# Patient Record
Sex: Male | Born: 1995 | Race: Black or African American | Hispanic: No | Marital: Single | Smoking: Current every day smoker
Health system: Southern US, Community
[De-identification: ages and names within clinical notes are randomized; demographics above are authoritative.]

## PROBLEM LIST (undated history)

## (undated) DIAGNOSIS — Z113 Encounter for screening for infections with a predominantly sexual mode of transmission: Secondary | ICD-10-CM

## (undated) DIAGNOSIS — Z599 Problem related to housing and economic circumstances, unspecified: Secondary | ICD-10-CM

## (undated) DIAGNOSIS — B20 Human immunodeficiency virus [HIV] disease: Secondary | ICD-10-CM

## (undated) DIAGNOSIS — R1013 Epigastric pain: Secondary | ICD-10-CM

## (undated) DIAGNOSIS — A539 Syphilis, unspecified: Secondary | ICD-10-CM

## (undated) DIAGNOSIS — Z21 Asymptomatic human immunodeficiency virus [HIV] infection status: Secondary | ICD-10-CM

## (undated) DIAGNOSIS — Z8659 Personal history of other mental and behavioral disorders: Secondary | ICD-10-CM

## (undated) HISTORY — DX: Problem related to housing and economic circumstances, unspecified: Z59.9

## (undated) HISTORY — DX: Human immunodeficiency virus (HIV) disease: B20

## (undated) HISTORY — DX: Encounter for screening for infections with a predominantly sexual mode of transmission: Z11.3

## (undated) HISTORY — DX: Syphilis, unspecified: A53.9

## (undated) HISTORY — DX: Asymptomatic human immunodeficiency virus (hiv) infection status: Z21

## (undated) HISTORY — DX: Personal history of other mental and behavioral disorders: Z86.59

## (undated) HISTORY — DX: Epigastric pain: R10.13

---
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
## 2013-07-30 ENCOUNTER — Encounter (HOSPITAL_COMMUNITY): Payer: Self-pay | Admitting: Family Medicine

## 2013-07-30 ENCOUNTER — Emergency Department (HOSPITAL_COMMUNITY): Payer: Medicaid Other

## 2013-07-30 ENCOUNTER — Emergency Department (HOSPITAL_COMMUNITY)
Admission: EM | Admit: 2013-07-30 | Discharge: 2013-07-30 | Disposition: A | Discharge status: Home / Self Care | Payer: Medicaid Other | Attending: Emergency Medicine | Admitting: Emergency Medicine

## 2013-07-30 DIAGNOSIS — F17200 Nicotine dependence, unspecified, uncomplicated: Secondary | ICD-10-CM | POA: Insufficient documentation

## 2013-07-30 DIAGNOSIS — M25569 Pain in unspecified knee: Secondary | ICD-10-CM | POA: Insufficient documentation

## 2013-07-30 DIAGNOSIS — M25562 Pain in left knee: Secondary | ICD-10-CM

## 2013-07-30 MED ORDER — ACETAMINOPHEN-CODEINE #3 300-30 MG PO TABS: 1.0000 | ORAL_TABLET | Freq: Four times a day (QID) | ORAL | Status: DC | PRN

## 2013-07-30 MED ORDER — IBUPROFEN 600 MG PO TABS: 600.0000 mg | ORAL_TABLET | Freq: Three times a day (TID) | ORAL | Status: DC

## 2013-07-30 MED ORDER — HYDROCODONE-ACETAMINOPHEN 5-325 MG PO TABS
2.0000 | ORAL_TABLET | Freq: Once | ORAL | Status: AC
  Administered 2013-07-30: 2 via ORAL
  Filled 2013-07-30: qty 2

## 2013-07-30 NOTE — ED Provider Notes (Signed)
CSN: [id number]     Arrival date & time 07/30/13  0441 History     First MD Initiated Contact with Patient 07/30/13 0458     Chief Complaint  Patient presents with  . Knee Pain   (Consider location/radiation/quality/duration/timing/severity/associated sxs/prior Treatment) Patient is a 17 y.o. male presenting with knee pain. The history is provided by the patient and the EMS personnel.  Knee Pain Location:  Knee Time since incident:  2 hours Injury: no   Knee location:  L knee Pain details:    Quality:  Shooting and sharp   Radiates to:  Does not radiate   Severity:  Severe   Onset quality:  Sudden   Timing:  Constant   Progression:  Unchanged Chronicity:  New Dislocation: no   Prior injury to area:  No Relieved by:  Immobilization Worsened by:  Bearing weight, activity, extension, abduction, adduction, flexion and rotation Ineffective treatments:  None tried Associated symptoms: decreased ROM and swelling   Associated symptoms: no back pain, no fever, no muscle weakness, no numbness and no stiffness   Risk factors: no concern for non-accidental trauma, no frequent fractures, no known bone disorder, no obesity and no recent illness     History reviewed. No pertinent past medical history. Past Surgical History  Procedure Laterality Date  . Wisdom tooth extraction  2014   History reviewed. No pertinent family history. History  Substance Use Topics  . Smoking status: Current Every Day Smoker -- 0.50 packs/day  . Smokeless tobacco: Not on file  . Alcohol Use: No    Review of Systems  Constitutional: Negative for fever and chills.  Musculoskeletal: Positive for arthralgias. Negative for back pain and stiffness.  Skin: Negative for color change and wound.  Neurological: Negative for weakness and numbness.    Allergies  Depakote  Home Medications   Current Outpatient Rx  Name  Route  Sig  Dispense  Refill  . acetaminophen-codeine (TYLENOL #3) 300-30 MG per  tablet   Oral   Take 1-2 tablets by mouth every 6 (six) hours as needed for pain.   15 tablet   0   . ibuprofen (ADVIL,MOTRIN) 600 MG tablet   Oral   Take 1 tablet (600 mg total) by mouth every 8 (eight) hours. Take with food   28 tablet   0    BP 121/61  Pulse 69  Temp(Src) 98.2 F (36.8 C) (Oral)  Resp 16  Ht 5\' 9"  (1.753 m)  Wt 150 lb (68.04 kg)  BMI 22.14 kg/m2  SpO2 99% Physical Exam  Nursing note and vitals reviewed. Constitutional: He appears well-developed and well-nourished. No distress.  Pulmonary/Chest: Effort normal. No respiratory distress.  Musculoskeletal: He exhibits tenderness. He exhibits no edema.       Left knee: He exhibits decreased range of motion. He exhibits no effusion, no ecchymosis, no deformity, no laceration, no erythema and normal alignment. Tenderness found.  Pt is keeping left knee in comfortable position in fully flexed state.  Pt reports extreme pain with any type of movement, pain with palpation diffusely.  2+ distal pulses, 2+ popliteal pulse, no abrasions, lacerations.  Intact distal sensation.  Soft compartments  Neurological: He is alert. He exhibits normal muscle tone.  Skin: Skin is warm and dry. No rash noted. He is not diaphoretic. No erythema.  Psychiatric: He has a normal mood and affect.    ED Course   Procedures (including critical care time)  Labs Reviewed - No data to display Dg  Knee Complete 4 Views Left  07/30/2013   *RADIOLOGY REPORT*  Clinical Data: Left knee pain.  No known injury.  LEFT KNEE - COMPLETE 4+ VIEW  Comparison: None.  Findings: The left knee appears intact. No evidence of acute fracture or subluxation.  No focal bone lesions.  Bone matrix and cortex appear intact.  No abnormal radiopaque densities in the soft tissues.  No significant effusion.  IMPRESSION: No acute bony abnormalities demonstrated in the left knee.   Original Report Authenticated By: Burman Nieves, M.D.   1. Knee pain, acute, left     RA  sat is 100% and I interpret to be normal  6:13 AM I reviewed plain films myself and radiologist interpretation.  No bony abn, no effusion.  Will place in knee sleeve, recommend RICE, refer to sports clinic or ortho if not improving in the next few days.    7:23 AM Pt's knee is improved.  Plain films neg.  Pt reports when he straightened knee out, ti seemed to improve the pain suddenly.  This supports that may have been a meniscal injury.    MDM  Pt with non traumatic sudden knee pain, very limited ROM due to pain, kept in flexed position.  Pt will not relax to extend it to anatomic position.  Will give analgesics and obtain plain films and reassess.  Neurovascularly intact  Gavin Pound. Oletta Lamas, MD 07/30/13 [phone]

## 2013-07-30 NOTE — ED Notes (Signed)
Pt transported to Xray by this RN, Xrays performed. Pt states felt "pop" upon straightening knee, states is in extreme pain. Pt alert, calm, texting on cell phone during xray.

## 2013-07-30 NOTE — ED Notes (Signed)
Pt talking on phone

## 2013-07-30 NOTE — Discharge Instructions (Signed)
Knee Pain  The knee is the complex joint between your thigh and your lower leg. It is made up of bones, tendons, ligaments, and cartilage. The bones that make up the knee are:   The femur in the thigh.   The tibia and fibula in the lower leg.   The patella or kneecap riding in the groove on the lower femur.  CAUSES   Knee pain is a common complaint with many causes. A few of these causes are:   Injury, such as:   A ruptured ligament or tendon injury.   Torn cartilage.   Medical conditions, such as:   Gout   Arthritis   Infections   Overuse, over training or overdoing a physical activity.  Knee pain can be minor or severe. Knee pain can accompany debilitating injury. Minor knee problems often respond well to self-care measures or get well on their own. More serious injuries may need medical intervention or even surgery.  SYMPTOMS  The knee is complex. Symptoms of knee problems can vary widely. Some of the problems are:   Pain with movement and weight bearing.   Swelling and tenderness.   Buckling of the knee.   Inability to straighten or extend your knee.   Your knee locks and you cannot straighten it.   Warmth and redness with pain and fever.   Deformity or dislocation of the kneecap.  DIAGNOSIS   Determining what is wrong may be very straight forward such as when there is an injury. It can also be challenging because of the complexity of the knee. Tests to make a diagnosis may include:   Your caregiver taking a history and doing a physical exam.   Routine X-rays can be used to rule out other problems. X-rays will not reveal a cartilage tear. Some injuries of the knee can be diagnosed by:   Arthroscopy a surgical technique by which a small video camera is inserted through tiny incisions on the sides of the knee. This procedure is used to examine and repair internal knee joint problems. Tiny instruments can be used during arthroscopy to repair the torn knee cartilage (meniscus).   Arthrography  is a radiology technique. A contrast liquid is directly injected into the knee joint. Internal structures of the knee joint then become visible on X-ray film.   An MRI scan is a non x-ray radiology procedure in which magnetic fields and a computer produce two- or three-dimensional images of the inside of the knee. Cartilage tears are often visible using an MRI scanner. MRI scans have largely replaced arthrography in diagnosing cartilage tears of the knee.   Blood work.   Examination of the fluid that helps to lubricate the knee joint (synovial fluid). This is done by taking a sample out using a needle and a syringe.  TREATMENT  The treatment of knee problems depends on the cause. Some of these treatments are:   Depending on the injury, proper casting, splinting, surgery or physical therapy care will be needed.   Give yourself adequate recovery time. Do not overuse your joints. If you begin to get sore during workout routines, back off. Slow down or do fewer repetitions.   For repetitive activities such as cycling or running, maintain your strength and nutrition.   Alternate muscle groups. For example if you are a weight lifter, work the upper body on one day and the lower body the next.   Either tight or weak muscles do not give the proper support for your   knee. Tight or weak muscles do not absorb the stress placed on the knee joint. Keep the muscles surrounding the knee strong.   Take care of mechanical problems.   If you have flat feet, orthotics or special shoes may help. See your caregiver if you need help.   Arch supports, sometimes with wedges on the inner or outer aspect of the heel, can help. These can shift pressure away from the side of the knee most bothered by osteoarthritis.   A brace called an "unloader" brace also may be used to help ease the pressure on the most arthritic side of the knee.   If your caregiver has prescribed crutches, braces, wraps or ice, use as directed. The acronym for  this is PRICE. This means protection, rest, ice, compression and elevation.   Nonsteroidal anti-inflammatory drugs (NSAID's), can help relieve pain. But if taken immediately after an injury, they may actually increase swelling. Take NSAID's with food in your stomach. Stop them if you develop stomach problems. Do not take these if you have a history of ulcers, stomach pain or bleeding from the bowel. Do not take without your caregiver's approval if you have problems with fluid retention, heart failure, or kidney problems.   For ongoing knee problems, physical therapy may be helpful.   Glucosamine and chondroitin are over-the-counter dietary supplements. Both may help relieve the pain of osteoarthritis in the knee. These medicines are different from the usual anti-inflammatory drugs. Glucosamine may decrease the rate of cartilage destruction.   Injections of a corticosteroid drug into your knee joint may help reduce the symptoms of an arthritis flare-up. They may provide pain relief that lasts a few months. You may have to wait a few months between injections. The injections do have a small increased risk of infection, water retention and elevated blood sugar levels.   Hyaluronic acid injected into damaged joints may ease pain and provide lubrication. These injections may work by reducing inflammation. A series of shots may give relief for as long as 6 months.   Topical painkillers. Applying certain ointments to your skin may help relieve the pain and stiffness of osteoarthritis. Ask your pharmacist for suggestions. Many over the-counter products are approved for temporary relief of arthritis pain.   In some countries, doctors often prescribe topical NSAID's for relief of chronic conditions such as arthritis and tendinitis. A review of treatment with NSAID creams found that they worked as well as oral medications but without the serious side effects.  PREVENTION   Maintain a healthy weight. Extra pounds put  more strain on your joints.   Get strong, stay limber. Weak muscles are a common cause of knee injuries. Stretching is important. Include flexibility exercises in your workouts.   Be smart about exercise. If you have osteoarthritis, chronic knee pain or recurring injuries, you may need to change the way you exercise. This does not mean you have to stop being active. If your knees ache after jogging or playing basketball, consider switching to swimming, water aerobics or other low-impact activities, at least for a few days a week. Sometimes limiting high-impact activities will provide relief.   Make sure your shoes fit well. Choose footwear that is right for your sport.   Protect your knees. Use the proper gear for knee-sensitive activities. Use kneepads when playing volleyball or laying carpet. Buckle your seat belt every time you drive. Most shattered kneecaps occur in car accidents.   Rest when you are tired.  SEEK MEDICAL CARE IF:     You have knee pain that is continual and does not seem to be getting better.   SEEK IMMEDIATE MEDICAL CARE IF:   Your knee joint feels hot to the touch and you have a high fever.  MAKE SURE YOU:    Understand these instructions.   Will watch your condition.   Will get help right away if you are not doing well or get worse.  Document Released: 09/22/2007 Document Revised: 02/17/2012 Document Reviewed: 09/22/2007  [employer] Patient Information 2014 [employer].

## 2013-07-30 NOTE — ED Notes (Signed)
Pt refused  X-ray of knee upon arriving at radiology. Pt sent back to tx room at this time

## 2013-07-30 NOTE — ED Notes (Signed)
Pt from home via [facility]. Pt awoke from sleep at 0130 with severe left knee pain; denies trauma to area. Knee appears mildly swollen.

## 2013-08-24 ENCOUNTER — Encounter (HOSPITAL_COMMUNITY): Payer: Self-pay | Admitting: Emergency Medicine

## 2013-08-24 ENCOUNTER — Emergency Department (INDEPENDENT_AMBULATORY_CARE_PROVIDER_SITE_OTHER): Payer: Medicaid Other

## 2013-08-24 ENCOUNTER — Emergency Department (INDEPENDENT_AMBULATORY_CARE_PROVIDER_SITE_OTHER)
Admission: EM | Admit: 2013-08-24 | Discharge: 2013-08-24 | Disposition: A | Discharge status: Home / Self Care | Payer: Self-pay | Source: Home / Self Care | Attending: Family Medicine | Admitting: Family Medicine

## 2013-08-24 DIAGNOSIS — R141 Gas pain: Secondary | ICD-10-CM

## 2013-08-24 NOTE — ED Provider Notes (Signed)
Travis Palmer is a 17 y.o. male who presents to Urgent Care today for abdominal bloating and loose stools present for about 2 weeks. Patient notes some bloating followed by loose stools and gas after he eats. He tried Pepto-Bismol which is only helped a little. He notes that he does it milk and dairy products. He denies any significant abdominal pain nausea, or vomiting. He denies any blood in the stool or mucus in his stool. He denies any family history for inflammatory bowel disease.   History reviewed. No pertinent past medical history. History  Substance Use Topics  . Smoking status: Current Every Day Smoker -- 0.50 packs/day  . Smokeless tobacco: Not on file  . Alcohol Use: No   ROS as above Medications reviewed. No current facility-administered medications for this encounter.   Current Outpatient Prescriptions  Medication Sig Dispense Refill  . bismuth subsalicylate (PEPTO BISMOL) 262 MG/15ML suspension Take 15 mLs by mouth every 6 (six) hours as needed for indigestion.      Marland Kitchen acetaminophen-codeine (TYLENOL #3) 300-30 MG per tablet Take 1-2 tablets by mouth every 6 (six) hours as needed for pain.  15 tablet  0  . ibuprofen (ADVIL,MOTRIN) 600 MG tablet Take 1 tablet (600 mg total) by mouth every 8 (eight) hours. Take with food  28 tablet  0    Exam:  BP 113/71  Pulse 94  Temp(Src) 98.4 F (36.9 C) (Oral)  Resp 16  SpO2 98%  Gen: Well NAD HEENT: EOMI,  MMM Lungs: CTABL Nl WOB Heart: RRR no MRG Abd: NABS, NT, ND, no rebound or guarding Exts: Non edematous BL  LE, warm and well perfused.   No results found for this or any previous visit (from the past 24 hour(s)). Dg Abd 1 View  08/24/2013   CLINICAL DATA:  No bowel movements in 2 weeks.  EXAM: ABDOMEN - 1 VIEW  COMPARISON:  No priors.  FINDINGS: Gas and stool are seen scattered throughout the colon extending to the level of the distal rectum. No pathologic distension of small bowel is noted. No gross evidence of  pneumoperitoneum. Stool burden does not appear excessive.  IMPRESSION: 1.  Nonobstructive bowel gas pattern. 2. No pneumoperitoneum.   Electronically Signed   By: Trudie Reed M.D.   On: 08/24/2013 19:47    Assessment and Plan: 17 y.o. male with abdominal gas. Possibly lactose intolerance. Recommended patient cut out dairy products and tried Gas-X. If this does not work he'll followup back here or with his primary care provider. Alternative diagnoses include celiac disease, irritable bowel syndrome, and much less likely inflammatory bowel disease. Discussed warning signs or symptoms. Please see discharge instructions. Patient expresses understanding.      Rodolph Bong, MD 08/24/13 2025

## 2013-08-24 NOTE — ED Notes (Signed)
Dr Denyse Amass at bedside

## 2013-09-05 ENCOUNTER — Emergency Department (HOSPITAL_COMMUNITY)
Admission: EM | Admit: 2013-09-05 | Discharge: 2013-09-05 | Disposition: A | Discharge status: Home / Self Care | Payer: Medicaid Other | Attending: Emergency Medicine | Admitting: Emergency Medicine

## 2013-09-05 ENCOUNTER — Encounter (HOSPITAL_COMMUNITY): Payer: Self-pay | Admitting: *Deleted

## 2013-09-05 DIAGNOSIS — IMO0002 Reserved for concepts with insufficient information to code with codable children: Secondary | ICD-10-CM | POA: Insufficient documentation

## 2013-09-05 DIAGNOSIS — Y9389 Activity, other specified: Secondary | ICD-10-CM | POA: Insufficient documentation

## 2013-09-05 DIAGNOSIS — Y92410 Unspecified street and highway as the place of occurrence of the external cause: Secondary | ICD-10-CM | POA: Insufficient documentation

## 2013-09-05 DIAGNOSIS — S0993XA Unspecified injury of face, initial encounter: Secondary | ICD-10-CM | POA: Insufficient documentation

## 2013-09-05 DIAGNOSIS — F17200 Nicotine dependence, unspecified, uncomplicated: Secondary | ICD-10-CM | POA: Insufficient documentation

## 2013-09-05 MED ORDER — HYDROCODONE-ACETAMINOPHEN 5-325 MG PO TABS: 1.0000 | ORAL_TABLET | ORAL | Status: DC | PRN

## 2013-09-05 MED ORDER — IBUPROFEN 600 MG PO TABS: 600.0000 mg | ORAL_TABLET | Freq: Four times a day (QID) | ORAL | Status: DC | PRN

## 2013-09-05 MED ORDER — CYCLOBENZAPRINE HCL 5 MG PO TABS: 5.0000 mg | ORAL_TABLET | Freq: Two times a day (BID) | ORAL | Status: DC | PRN

## 2013-09-05 NOTE — ED Provider Notes (Signed)
Medical screening examination/treatment/procedure(s) were performed by non-physician practitioner and as supervising physician I was immediately available for consultation/collaboration.   William Luanne Krzyzanowski, MD 09/05/13 2326 
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
## 2013-12-20 ENCOUNTER — Encounter (HOSPITAL_COMMUNITY): Payer: Self-pay | Admitting: Emergency Medicine

## 2013-12-20 ENCOUNTER — Emergency Department (HOSPITAL_COMMUNITY)
Admission: EM | Admit: 2013-12-20 | Discharge: 2013-12-20 | Disposition: A | Discharge status: Home / Self Care | Payer: Medicaid Other | Attending: Emergency Medicine | Admitting: Emergency Medicine

## 2013-12-20 DIAGNOSIS — F17200 Nicotine dependence, unspecified, uncomplicated: Secondary | ICD-10-CM | POA: Insufficient documentation

## 2013-12-20 DIAGNOSIS — J029 Acute pharyngitis, unspecified: Secondary | ICD-10-CM | POA: Insufficient documentation

## 2013-12-20 DIAGNOSIS — Z113 Encounter for screening for infections with a predominantly sexual mode of transmission: Secondary | ICD-10-CM | POA: Insufficient documentation

## 2013-12-20 DIAGNOSIS — Z711 Person with feared health complaint in whom no diagnosis is made: Secondary | ICD-10-CM

## 2013-12-20 LAB — RPR: RPR: NONREACTIVE

## 2013-12-20 LAB — RAPID STREP SCREEN (MED CTR MEBANE ONLY): Streptococcus, Group A Screen (Direct): NEGATIVE

## 2013-12-20 MED ORDER — LIDOCAINE HCL (PF) 1 % IJ SOLN
INTRAMUSCULAR | Status: AC
  Administered 2013-12-20: 14:00:00 2 mL
  Filled 2013-12-20: qty 5

## 2013-12-20 MED ORDER — AZITHROMYCIN 250 MG PO TABS
1000.0000 mg | ORAL_TABLET | Freq: Once | ORAL | Status: AC
  Administered 2013-12-20: 1000 mg via ORAL
  Filled 2013-12-20: qty 4

## 2013-12-20 MED ORDER — LIDOCAINE HCL (PF) 1 % IJ SOLN
2.0000 mL | Freq: Once | INTRAMUSCULAR | Status: AC
  Administered 2013-12-20: 2 mL

## 2013-12-20 MED ORDER — CEFTRIAXONE SODIUM 250 MG IJ SOLR
250.0000 mg | Freq: Once | INTRAMUSCULAR | Status: AC
  Administered 2013-12-20: 250 mg via INTRAMUSCULAR
  Filled 2013-12-20: qty 250

## 2013-12-20 NOTE — ED Notes (Addendum)
Pt reports "I have had spells with my sore throat my whole life but this is worse than any other time, I looked in the mirror and it looks like a white patch or a sore back there, I think I might have gonorrhea." Throat appears red, pt reports pain on swallowing. PT denies urinary symptoms

## 2013-12-20 NOTE — Discharge Instructions (Signed)
You were treated today for both gonorrhea and Chlamydia. If any of her tests from today results positive, you will be contacted and are then obligated to inform your partner.  Pharyngitis Pharyngitis is redness, pain, and swelling (inflammation) of your pharynx.  CAUSES  Pharyngitis is usually caused by infection. Most of the time, these infections are from viruses (viral) and are part of a cold. However, sometimes pharyngitis is caused by bacteria (bacterial). Pharyngitis can also be caused by allergies. Viral pharyngitis may be spread from person to person by coughing, sneezing, and personal items or utensils (cups, forks, spoons, toothbrushes). Bacterial pharyngitis may be spread from person to person by more intimate contact, such as kissing.  SIGNS AND SYMPTOMS  Symptoms of pharyngitis include:   Sore throat.   Tiredness (fatigue).   Low-grade fever.   Headache.  Joint pain and muscle aches.  Skin rashes.  Swollen lymph nodes.  Plaque-like film on throat or tonsils (often seen with bacterial pharyngitis). DIAGNOSIS  Your health care provider will ask you questions about your illness and your symptoms. Your medical history, along with a physical exam, is often all that is needed to diagnose pharyngitis. Sometimes, a rapid strep test is done. Other lab tests may also be done, depending on the suspected cause.  TREATMENT  Viral pharyngitis will usually get better in 3 4 days without the use of medicine. Bacterial pharyngitis is treated with medicines that kill germs (antibiotics).  HOME CARE INSTRUCTIONS   Drink enough water and fluids to keep your urine clear or pale yellow.   Only take over-the-counter or prescription medicines as directed by your health care provider:   If you are prescribed antibiotics, make sure you finish them even if you start to feel better.   Do not take aspirin.   Get lots of rest.   Gargle with 8 oz of salt water ( tsp of salt per 1 qt  of water) as often as every 1 2 hours to soothe your throat.   Throat lozenges (if you are not at risk for choking) or sprays may be used to soothe your throat. SEEK MEDICAL CARE IF:   You have large, tender lumps in your neck.  You have a rash.  You cough up green, yellow-brown, or bloody spit. SEEK IMMEDIATE MEDICAL CARE IF:   Your neck becomes stiff.  You drool or are unable to swallow liquids.  You vomit or are unable to keep medicines or liquids down.  You have severe pain that does not go away with the use of recommended medicines.  You have trouble breathing (not caused by a stuffy nose). MAKE SURE YOU:   Understand these instructions.  Will watch your condition.  Will get help right away if you are not doing well or get worse. Document Released: 11/25/2005 Document Revised: 09/15/2013 Document Reviewed: 08/02/2013 [facility] Patient Information 2014 [employer].

## 2013-12-20 NOTE — ED Notes (Signed)
Pt comfortable with d/c and f/u instructions. No prescriptions. Pt ambulatory from room in NAD.

## 2013-12-20 NOTE — ED Provider Notes (Signed)
CSN: [id number]     Arrival date & time 12/20/13  1151 History   First MD Initiated Contact with Patient 12/20/13 1205    This chart was scribed for Lorenda Peck, a non-physician practitioner working with Jasper Riling. Alvino Chapel, MD by Denice Bors, ED Scribe. This patient was seen in room TR10C/TR10C and the patient's care was started at 12:20 PM     No chief complaint on file.  (Consider location/radiation/quality/duration/timing/severity/associated sxs/prior Treatment) The history is provided by the patient. No language interpreter was used.   HPI Comments: Travis Palmer is a 18 y.o. male who presents to the Emergency Department with PMHx of recurrent sore throats (per pt) complaining of constant worsening sore throat onset 2 weeks. Reports associated "white patch or sore" in post oropharynx, cough with occasional productive cough.  Reports pain is exacerbated by swallowing. Denies trying any alleviating factors. Denies associated dysphagia, fever, and difficulty breathing.  States he wants to be evaluated for gonorrhea because "partner has it". Denies associated penile discharge, penile pain, dysuria, abdominal pain, and testicular pain.  No past medical history on file. Past Surgical History  Procedure Laterality Date  . Wisdom tooth extraction  2014   No family history on file. History  Substance Use Topics  . Smoking status: Current Every Day Smoker -- 0.50 packs/day  . Smokeless tobacco: Not on file  . Alcohol Use: No    Review of Systems  Constitutional: Negative for fever.  HENT: Positive for sore throat.   Respiratory: Negative for shortness of breath.   Hematological: Positive for adenopathy.  Psychiatric/Behavioral: Negative for confusion.   A complete 10 system review of systems was obtained and all systems are negative except as noted in the HPI and PMHx.    Allergies  Depakote  Home Medications  No current outpatient prescriptions on file. BP 137/80   Pulse 86  Temp(Src) 98.5 F (36.9 C) (Oral)  Resp 16  SpO2 99% Physical Exam  Nursing note and vitals reviewed. Constitutional: He is oriented to person, place, and time. He appears well-developed and well-nourished. No distress.  HENT:  Head: Normocephalic and atraumatic.  Mouth/Throat: Uvula is midline and mucous membranes are normal. No oral lesions. No trismus in the jaw. No uvula swelling. Posterior oropharyngeal erythema present. No oropharyngeal exudate, posterior oropharyngeal edema or tonsillar abscesses.  Enlarged tonsils bilaterally without exudates. No signs of peritonsillar or tonsillar abscess. Oropharynx is clear and without exudates.  Uvula is midline.  Airway is intact.   Eyes: Conjunctivae and EOM are normal.  Neck: Normal range of motion. Neck supple. No tracheal deviation present.  Cardiovascular: Normal rate.   Pulmonary/Chest: Effort normal. No respiratory distress.  Genitourinary: Testes normal. Right testis shows no mass, no swelling and no tenderness. Left testis shows no mass, no swelling and no tenderness. Circumcised. No penile erythema or penile tenderness. No discharge found.  Musculoskeletal: Normal range of motion.  Lymphadenopathy:    He has cervical adenopathy.  Neurological: He is alert and oriented to person, place, and time.  Skin: Skin is warm and dry.  Psychiatric: He has a normal mood and affect. His behavior is normal.    ED Course  Procedures   COORDINATION OF CARE:  Nursing notes reviewed. Vital signs reviewed. Initial pt interview and examination performed.   12:34 PM-Discussed work up plan with pt at bedside, which includes rapid strep screen, GC/Chlamydia, HIV antibody, and RPR. Pt agrees with plan.   Treatment plan initiated:Medications - No data to display  Initial diagnostic testing ordered.    Labs Review Labs Reviewed - No data to display Imaging Review No results found.  EKG Interpretation   None       MDM   1.  Pharyngitis   2. Concern about STD in male without diagnosis    Patient presenting with sore throat. He is well appearing and in no apparent distress, afebrile with normal vital signs. Tonsils are enlarged and inflamed bilateral without exudate. Rapid strep negative. He was also exposed to possible gonorrhea. He is concerned about sexually transmitted diseases. GC/Chlamydia cultures pending, HIV and RPR pending. Rocephin and azithromycin given. Stable for discharge. Return precautions given. Patient states understanding of treatment care plan and is agreeable.   I personally performed the services described in this documentation, which was scribed in my presence. The recorded information has been reviewed and is accurate.    Illene Labrador, PA-C 12/20/13 1308

## 2013-12-20 NOTE — ED Provider Notes (Signed)
Medical screening examination/treatment/procedure(s) were performed by non-physician practitioner and as supervising physician I was immediately available for consultation/collaboration.  EKG Interpretation   None        Jasper Riling. Alvino Chapel, MD 12/20/13 [phone]

## 2013-12-21 ENCOUNTER — Telehealth: Payer: Self-pay | Admitting: Infectious Diseases

## 2013-12-21 LAB — HIV ANTIBODY (ROUTINE TESTING W REFLEX): HIV: REACTIVE — AB

## 2013-12-21 NOTE — Telephone Encounter (Signed)
Pt called and left message on his cell to call back to ID clinic re: positive HIV screening test.

## 2013-12-22 LAB — CULTURE, GROUP A STREP

## 2013-12-22 NOTE — ED Notes (Addendum)
+   HIV -Note put in computer that ID is trying to get in contact with patient.

## 2013-12-23 LAB — GC/CHLAMYDIA PROBE AMP
CT PROBE, AMP APTIMA: NEGATIVE
GC Probe RNA: NEGATIVE

## 2013-12-24 ENCOUNTER — Telehealth: Payer: Self-pay | Admitting: Infectious Disease

## 2013-12-24 NOTE — Telephone Encounter (Signed)
Tried to call pt re his HIV EIA positive result  His phone number is busy. Will try again. Hopefully we havea working number otherwise will have to send bridge counselor to him. WB not back yet but could also be in seroconversion phase so needs an HIV Ultra RNA regardless with reflex to genotype

## 2013-12-24 NOTE — Telephone Encounter (Signed)
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

## 2014-11-21 ENCOUNTER — Telehealth: Payer: Self-pay

## 2014-11-21 NOTE — Telephone Encounter (Signed)
Patient contacted regarding new intake appointment. Date and time given. Information given regarding documents needed to qualify for financial eligibility.  Patient states he has medicaid but not picture ID.  Laverle Patter, RN

## 2014-11-28 ENCOUNTER — Ambulatory Visit: Payer: Medicaid Other

## 2014-12-07 ENCOUNTER — Other Ambulatory Visit (HOSPITAL_COMMUNITY)
Admission: RE | Admit: 2014-12-07 | Discharge: 2014-12-07 | Disposition: A | Discharge status: Home / Self Care | Payer: Medicaid Other | Source: Ambulatory Visit | Attending: Internal Medicine | Admitting: Internal Medicine

## 2014-12-07 ENCOUNTER — Ambulatory Visit (INDEPENDENT_AMBULATORY_CARE_PROVIDER_SITE_OTHER): Payer: Medicaid Other

## 2014-12-07 ENCOUNTER — Ambulatory Visit (INDEPENDENT_AMBULATORY_CARE_PROVIDER_SITE_OTHER): Payer: Medicaid Other | Admitting: Licensed Clinical Social Worker

## 2014-12-07 ENCOUNTER — Encounter: Payer: Self-pay | Admitting: *Deleted

## 2014-12-07 DIAGNOSIS — Z23 Encounter for immunization: Secondary | ICD-10-CM

## 2014-12-07 DIAGNOSIS — Z113 Encounter for screening for infections with a predominantly sexual mode of transmission: Secondary | ICD-10-CM | POA: Insufficient documentation

## 2014-12-07 DIAGNOSIS — B20 Human immunodeficiency virus [HIV] disease: Secondary | ICD-10-CM

## 2014-12-07 NOTE — Progress Notes (Signed)
18 year old patient here today for new hiv intake. Patient was tested at [facility] and diagnosed in November 2015. 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 He expressed concerned of swollen lymph nodes and sore throat, but denies weight loss, fever, or thrush. Patient was given vaccines today and scheduled to see Dr. Megan Salon on 12/27/2014 at 9:15.

## 2014-12-08 LAB — CBC WITH DIFFERENTIAL/PLATELET
Basophils Absolute: 0 10*3/uL (ref 0.0–0.1)
Basophils Relative: 1 % (ref 0–1)
Eosinophils Absolute: 0.1 10*3/uL (ref 0.0–0.7)
Eosinophils Relative: 2 % (ref 0–5)
HCT: 40.2 % (ref 39.0–52.0)
Hemoglobin: 13.4 g/dL (ref 13.0–17.0)
LYMPHS PCT: 40 % (ref 12–46)
Lymphs Abs: 1.4 10*3/uL (ref 0.7–4.0)
MCH: 30.4 pg (ref 26.0–34.0)
MCHC: 33.3 g/dL (ref 30.0–36.0)
MCV: 91.2 fL (ref 78.0–100.0)
MONO ABS: 0.4 10*3/uL (ref 0.1–1.0)
MONOS PCT: 12 % (ref 3–12)
MPV: 9 fL (ref 8.6–12.4)
NEUTROS ABS: 1.5 10*3/uL — AB (ref 1.7–7.7)
Neutrophils Relative %: 45 % (ref 43–77)
Platelets: 330 10*3/uL (ref 150–400)
RBC: 4.41 MIL/uL (ref 4.22–5.81)
RDW: 13.8 % (ref 11.5–15.5)
WBC: 3.4 10*3/uL — ABNORMAL LOW (ref 4.0–10.5)

## 2014-12-08 LAB — COMPLETE METABOLIC PANEL WITH GFR
ALT: 10 U/L (ref 0–53)
AST: 13 U/L (ref 0–37)
Albumin: 4.1 g/dL (ref 3.5–5.2)
Alkaline Phosphatase: 52 U/L (ref 39–117)
BUN: 13 mg/dL (ref 6–23)
CHLORIDE: 102 meq/L (ref 96–112)
CO2: 28 meq/L (ref 19–32)
Calcium: 8.9 mg/dL (ref 8.4–10.5)
Creat: 0.74 mg/dL (ref 0.50–1.35)
GFR, Est Non African American: >89 mL/min
Glucose, Bld: 77 mg/dL (ref 70–99)
Potassium: 4.1 mEq/L (ref 3.5–5.3)
Sodium: 136 mEq/L (ref 135–145)
TOTAL PROTEIN: 7.3 g/dL (ref 6.0–8.3)
Total Bilirubin: 0.4 mg/dL (ref 0.2–1.2)

## 2014-12-08 LAB — URINALYSIS
Bilirubin Urine: NEGATIVE
Glucose, UA: NEGATIVE mg/dL
Hgb urine dipstick: NEGATIVE
KETONES UR: NEGATIVE mg/dL
Leukocytes, UA: NEGATIVE
Nitrite: NEGATIVE
PH: 7 (ref 5.0–8.0)
Protein, ur: NEGATIVE mg/dL
SPECIFIC GRAVITY, URINE: 1.027 (ref 1.005–1.030)
Urobilinogen, UA: 0.2 mg/dL (ref 0.0–1.0)

## 2014-12-08 LAB — RPR

## 2014-12-08 LAB — URINE CYTOLOGY ANCILLARY ONLY
Chlamydia: NEGATIVE
Neisseria Gonorrhea: NEGATIVE

## 2014-12-08 LAB — T-HELPER CELL (CD4) - (RCID CLINIC ONLY)
CD4 % Helper T Cell: 30 % — ABNORMAL LOW (ref 33–55)
CD4 T CELL ABS: 430 /uL (ref 400–2700)

## 2014-12-08 LAB — LIPID PANEL
CHOLESTEROL: 106 mg/dL (ref 0–200)
HDL: 32 mg/dL — ABNORMAL LOW (ref >39)
LDL CALC: 58 mg/dL (ref 0–99)
TRIGLYCERIDES: 78 mg/dL (ref <150)
Total CHOL/HDL Ratio: 3.3 Ratio
VLDL: 16 mg/dL (ref 0–40)

## 2014-12-08 LAB — HEPATITIS B SURFACE ANTIBODY,QUALITATIVE: Hep B S Ab: NEGATIVE

## 2014-12-08 LAB — HEPATITIS A ANTIBODY, TOTAL: Hep A Total Ab: REACTIVE — AB

## 2014-12-08 LAB — HEPATITIS B SURFACE ANTIGEN: HEP B S AG: NEGATIVE

## 2014-12-08 LAB — HEPATITIS C ANTIBODY: HCV Ab: NEGATIVE

## 2014-12-08 LAB — HEPATITIS B CORE ANTIBODY, TOTAL: Hep B Core Total Ab: NONREACTIVE

## 2014-12-10 LAB — HIV-1 RNA ULTRAQUANT REFLEX TO GENTYP+
HIV 1 RNA Quant: 140743 copies/mL — ABNORMAL HIGH (ref <20)
HIV-1 RNA Quant, Log: 5.15 {Log} — ABNORMAL HIGH (ref <1.30)

## 2014-12-11 LAB — HLA B*5701: HLA-B 5701 W/RFLX HLA-B HIGH: NEGATIVE

## 2014-12-12 LAB — QUANTIFERON TB GOLD ASSAY (BLOOD)
Interferon Gamma Release Assay: NEGATIVE
MITOGEN VALUE: 1.61 [IU]/mL
Quantiferon Nil Value: 0.05 IU/mL
Quantiferon Tb Ag Minus Nil Value: 0 IU/mL
TB Ag value: 0.04 IU/mL

## 2014-12-23 LAB — HIV-1 GENOTYPR PLUS

## 2014-12-27 ENCOUNTER — Ambulatory Visit: Payer: Medicaid Other | Admitting: Internal Medicine

## 2015-01-09 ENCOUNTER — Ambulatory Visit (INDEPENDENT_AMBULATORY_CARE_PROVIDER_SITE_OTHER): Payer: Medicaid Other | Admitting: *Deleted

## 2015-01-09 ENCOUNTER — Ambulatory Visit (INDEPENDENT_AMBULATORY_CARE_PROVIDER_SITE_OTHER): Payer: Medicaid Other | Admitting: Internal Medicine

## 2015-01-09 ENCOUNTER — Encounter: Payer: Self-pay | Admitting: Internal Medicine

## 2015-01-09 VITALS — BP 136/78 | HR 81 | Temp 98.3°F | Ht 68.5 in | Wt 166.2 lb

## 2015-01-09 DIAGNOSIS — B20 Human immunodeficiency virus [HIV] disease: Secondary | ICD-10-CM | POA: Insufficient documentation

## 2015-01-09 DIAGNOSIS — F641 Gender identity disorder in adolescence and adulthood: Secondary | ICD-10-CM

## 2015-01-09 DIAGNOSIS — Z006 Encounter for examination for normal comparison and control in clinical research program: Secondary | ICD-10-CM

## 2015-01-09 DIAGNOSIS — F17210 Nicotine dependence, cigarettes, uncomplicated: Secondary | ICD-10-CM | POA: Insufficient documentation

## 2015-01-09 DIAGNOSIS — Z720 Tobacco use: Secondary | ICD-10-CM

## 2015-01-09 DIAGNOSIS — Z789 Other specified health status: Secondary | ICD-10-CM

## 2015-01-09 DIAGNOSIS — F640 Transsexualism: Secondary | ICD-10-CM

## 2015-01-09 LAB — COMPREHENSIVE METABOLIC PANEL
ALT: 11 U/L (ref 0–53)
AST: 16 U/L (ref 0–37)
Albumin: 4.2 g/dL (ref 3.5–5.2)
Alkaline Phosphatase: 58 U/L (ref 39–117)
BILIRUBIN TOTAL: 0.4 mg/dL (ref 0.2–1.2)
BUN: 9 mg/dL (ref 6–23)
CALCIUM: 8.9 mg/dL (ref 8.4–10.5)
CHLORIDE: 105 meq/L (ref 96–112)
CO2: 27 mEq/L (ref 19–32)
Creat: 0.83 mg/dL (ref 0.50–1.35)
Glucose, Bld: 64 mg/dL — ABNORMAL LOW (ref 70–99)
Potassium: 4 mEq/L (ref 3.5–5.3)
Sodium: 141 mEq/L (ref 135–145)
Total Protein: 7.4 g/dL (ref 6.0–8.3)

## 2015-01-09 LAB — BILIRUBIN,DIRECT & INDIRECT (FRACTIONATED)
BILIRUBIN INDIRECT: 0.3 mg/dL (ref 0.2–1.2)
Bilirubin, Direct: 0.1 mg/dL (ref 0.0–0.3)

## 2015-01-09 LAB — CBC WITH DIFFERENTIAL/PLATELET
BASOS ABS: 0 10*3/uL (ref 0.0–0.1)
Basophils Relative: 1 % (ref 0–1)
Eosinophils Absolute: 0.1 10*3/uL (ref 0.0–0.7)
Eosinophils Relative: 2 % (ref 0–5)
HCT: 40.4 % (ref 39.0–52.0)
HEMOGLOBIN: 13.6 g/dL (ref 13.0–17.0)
LYMPHS PCT: 34 % (ref 12–46)
Lymphs Abs: 1.3 10*3/uL (ref 0.7–4.0)
MCH: 30.6 pg (ref 26.0–34.0)
MCHC: 33.7 g/dL (ref 30.0–36.0)
MCV: 90.8 fL (ref 78.0–100.0)
MONOS PCT: 15 % — AB (ref 3–12)
MPV: 9 fL (ref 8.6–12.4)
Monocytes Absolute: 0.6 10*3/uL (ref 0.1–1.0)
NEUTROS ABS: 1.8 10*3/uL (ref 1.7–7.7)
Neutrophils Relative %: 48 % (ref 43–77)
PLATELETS: 296 10*3/uL (ref 150–400)
RBC: 4.45 MIL/uL (ref 4.22–5.81)
RDW: 13.9 % (ref 11.5–15.5)
WBC: 3.7 10*3/uL — AB (ref 4.0–10.5)

## 2015-01-09 NOTE — Progress Notes (Signed)
Patient ID: Travis Palmer, male   DOB: [birth year], 19 y.o.   MRN: [id number]          Patient Active Problem List   Diagnosis Date Noted  . HIV disease 01/09/2015    Priority: High  . 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 Cigarette smoker 01/09/2015    Patient's Medications   No medications on file    Subjective: Travis Palmer (her preferred name) is in for her first visit to establish ongoing care for her HIV infection. It appears she was first diagnosed in January of last year when in the emergency department but she states that she never learned of that test result. She went to [facility] recently for routine testing and found out she was positive at that time. Since learning of her test results she has shared the results with a few very close friends but none of her family. She states that she has not practically close with her parents 2 older brothers or younger sister. [name] transgender male to male and interested in starting hormone therapy. She does not have a primary care physician.  She has had multiple male sexual partners in her life. [name] currently not in a relationship and not sexually active. She has a remote history of being diagnosed with bipolar disorder and was on medications up until middle school. She states that she does not take any medications or supplements now and does not feel she needs anything for mental health problems. She states that she is depressed because she doesn't have a place to live, a job or transportation. She has been living couch to couch with family and friends. She does not have a case manager yet.  She smokes cigarettes and occasionally marijuana. She denies alcohol or other drug use. She states that she eats whenever she can get access to food. She estimates that she only has 1 meal daily.  Review of Systems: Constitutional: negative for anorexia, chills, fatigue, fevers, sweats and weight loss Eyes: negative Ears, nose, mouth, throat, and face:  positive for sore throat and sensitivity and pain of her gums, negative for dental pain Respiratory: negative Cardiovascular: negative Gastrointestinal: negative Genitourinary:negative  Past Medical History  Diagnosis Date  . History of bipolar disorder   . HIV infection     History  Substance Use Topics  . Smoking status: Current Every Day Smoker -- 0.50 packs/day  . Smokeless tobacco: Not on file  . Alcohol Use: No     Comment: weekend drinker    Family History  Problem Relation Age of Onset  . Lactose intolerance Mother   . Cancer Maternal Aunt   . Hypertension Maternal Grandmother     Allergies  Allergen Reactions  . Depakote [Divalproex Sodium] Other (See Comments)    Hospital for 3 days for extreme GI upset    Objective: Temp: 98.3 F (36.8 C) (02/01 1440) Temp Source: Oral (02/01 1440) BP: 136/78 mmHg (02/01 1440) Pulse Rate: 81 (02/01 1440) Body mass index is 24.91 kg/(m^2).  General: [name] alert and in no distress. Oral: Her gums are swollen and erythematous. No oropharyngeal lesions. Her teeth are in good condition Skin: No rash Lungs: Clear Cor: Regular S1 and S2 with no murmurs Breasts: Some breast enlargement Abdomen: Soft and nontender with no palpable masses Joints and extremities: Normal Neuro: Normal speech and conversation Mood and affect: She become somewhat upset and defensive during parts of the exam, especially when talking about her difficult social situation and past  history of bipolar disorder  Lab Results Lab Results  Component Value Date   WBC 3.4* 12/07/2014   HGB 13.4 12/07/2014   HCT 40.2 12/07/2014   MCV 91.2 12/07/2014   PLT 330 12/07/2014    Lab Results  Component Value Date   CREATININE 0.74 12/07/2014   BUN 13 12/07/2014   NA 136 12/07/2014   K 4.1 12/07/2014   CL 102 12/07/2014   CO2 28 12/07/2014    Lab Results  Component Value Date   ALT 10 12/07/2014   AST 13 12/07/2014   ALKPHOS 52 12/07/2014   BILITOT  0.4 12/07/2014    Lab Results  Component Value Date   CHOL 106 12/07/2014   HDL 32* 12/07/2014   LDLCALC 58 12/07/2014   TRIG 78 12/07/2014   CHOLHDL 3.3 12/07/2014    Lab Results HIV 1 RNA QUANT (copies/mL)  Date Value  12/07/2014 [id number]*   CD4 T CELL ABS (/uL)  Date Value  12/07/2014 430   Other laboratory results from 12/07/2014 HIV genotype: No mutations noted RPR, gonorrhea and chlamydia screening test: Negative QuantiFERON gold TB test: Negative HLA [id number]: Negative Hepatitis A antibody: Positive Hepatitis B surface antibody: Negative Hepatitis C antibody: Negative   Assessment: Fortunately her CD4 count remains normal and she has infection with wild type HIV virus. [name] very eager to start antiretroviral therapy. I spoke with her today about treatment options as did our pharmacist, Travis Palmer. She also met with Travis Palmer her study coordinator. She wants to consider options. I will see her back next week. Given her food insecurity and varying times of day that she eats it will be important that she is on a regimen that can be taken on an empty stomach. We also need to plan for the possibility that she will lose Medicaid when she turns 19 later this year.  Plan: 1. Follow-up next week to discuss treatment options 2. She will need hepatitis B vaccine 3. She will need further prevention for positive counseling 4. Cigarette cessation counseling provided 5. Establish primary care   Travis Bickers, MD [facility] [place] [phone] pager   [phone] cell 01/09/2015, 5:01 PM

## 2015-01-09 NOTE — Progress Notes (Signed)
Patient ID: Travis Palmer, male   DOB: [birth year], 19 y.o.   MRN: [id number] HPI: Travis Palmer is a 19 y.o. male who is a newly dx HIV who is here for her visit.   Allergies: Allergies  Allergen Reactions  . Depakote [Divalproex Sodium] Other (See Comments)    Hospital for 3 days for extreme GI upset    Vitals: Temp: 98.3 F (36.8 C) (02/01 1440) Temp Source: Oral (02/01 1440) BP: 136/78 mmHg (02/01 1440) Pulse Rate: 81 (02/01 1440)  Past Medical History: No past medical history on file.  Social History: History   Social History  . Marital Status: Single    Spouse Name: N/A    Number of Children: N/A  . Years of Education: N/A   Social History Main Topics  . Smoking status: Current Every Day Smoker -- 0.50 packs/day  . Smokeless tobacco: None  . Alcohol Use: No     Comment: weekend drinker  . Drug Use: 7.00 per week    Special: Marijuana  . Sexual Activity: Not Currently     Comment: given condoms   Other Topics Concern  . None   Social History Narrative    Previous Regimen: None   Current Regimen: None  Labs: HIV 1 RNA QUANT (copies/mL)  Date Value  12/07/2014 [id number]*   CD4 T CELL ABS (/uL)  Date Value  12/07/2014 430   HEP B S AB (no units)  Date Value  12/07/2014 NEG   HEPATITIS B SURFACE AG (no units)  Date Value  12/07/2014 NEGATIVE   HCV AB (no units)  Date Value  12/07/2014 NEGATIVE    CrCl: CrCl cannot be calculated (Patient has no serum creatinine result on file.).  Lipids:    Component Value Date/Time   CHOL 106 12/07/2014 1203   TRIG 78 12/07/2014 1203   HDL 32* 12/07/2014 1203   CHOLHDL 3.3 12/07/2014 1203   VLDL 16 12/07/2014 1203   LDLCALC 58 12/07/2014 1203    Assessment: 19 yo transgender who was recently dx with HIV. [name] treatment naive without resistance on the genotype. She currently has medicaid. The plan was to start her on Triumeq but Travis Palmer says she might be a candidate for DTG+3TC study. [name] going to talk  to Dr. Megan Palmer about it. Medicaid will be expired in 6 months so the Study route might be the better option.   Recommendations: DTG + 3TC study  Or  Triumeq 1 PO qday through Travis Palmer, Travis Palmer, PharmD Clinical Infectious Rule for Infectious Disease 01/09/2015, 3:24 PM

## 2015-01-10 LAB — HEPATITIS B SURFACE ANTIGEN: Hepatitis B Surface Ag: NEGATIVE

## 2015-01-10 NOTE — Progress Notes (Signed)
[employer] account planner) was seen for screening for the ACTG [phone], treatment for new patients with the drugs Tivicay and Lamivudine. Informed consent was obtained after the patient had taken the consent home and read over it and we discussed it in detail. She understands it is completely voluntary and her duties as a research subject as well as an understanding of what the protocol is studying. She apparently was diagnosed over a year ago, but  didn't get followup. She says she is ready to start treatment now. She doesn't have a permanent home and I asked her if she thought she would be able to keep up with 2 bottles of medication and she verbalized that she could. There are frequent followup appts and she understands the importance of keeping up with those appointments. She denies any current complaints except for nightsweats which she said had been going on for over a year. We are collecting an integrase resistance test today and after that result returns she will be able to enroll on study. We have her tentatively scheduled for February 23 for entry.

## 2015-01-31 ENCOUNTER — Ambulatory Visit (INDEPENDENT_AMBULATORY_CARE_PROVIDER_SITE_OTHER): Payer: Self-pay | Admitting: *Deleted

## 2015-01-31 DIAGNOSIS — Z006 Encounter for examination for normal comparison and control in clinical research program: Secondary | ICD-10-CM

## 2015-01-31 DIAGNOSIS — Z21 Asymptomatic human immunodeficiency virus [HIV] infection status: Secondary | ICD-10-CM

## 2015-01-31 DIAGNOSIS — B20 Human immunodeficiency virus [HIV] disease: Secondary | ICD-10-CM

## 2015-01-31 LAB — COMPREHENSIVE METABOLIC PANEL
ALK PHOS: 50 U/L (ref 39–117)
ALT: 12 U/L (ref 0–53)
AST: 15 U/L (ref 0–37)
Albumin: 4.1 g/dL (ref 3.5–5.2)
BILIRUBIN TOTAL: 0.4 mg/dL (ref 0.2–1.2)
BUN: 11 mg/dL (ref 6–23)
CALCIUM: 8.8 mg/dL (ref 8.4–10.5)
CO2: 26 mEq/L (ref 19–32)
CREATININE: 0.78 mg/dL (ref 0.50–1.35)
Chloride: 101 mEq/L (ref 96–112)
Glucose, Bld: 84 mg/dL (ref 70–99)
Potassium: 4.2 mEq/L (ref 3.5–5.3)
Sodium: 136 mEq/L (ref 135–145)
Total Protein: 7.4 g/dL (ref 6.0–8.3)

## 2015-01-31 LAB — LIPID PANEL
Cholesterol: 92 mg/dL (ref 0–200)
HDL: 28 mg/dL — AB (ref >=40)
LDL CALC: 46 mg/dL (ref 0–99)
TRIGLYCERIDES: 90 mg/dL (ref <150)
Total CHOL/HDL Ratio: 3.3 Ratio
VLDL: 18 mg/dL (ref 0–40)

## 2015-01-31 LAB — CD4/CD8 (T-HELPER/T-SUPPRESSOR CELL)
CD4%: 26
CD4: 312
CD8 % Suppressor T Cell: 48.6
CD8: 583

## 2015-01-31 LAB — BILIRUBIN, DIRECT: Bilirubin, Direct: 0.1 mg/dL (ref 0.0–0.3)

## 2015-02-01 LAB — POCT URINALYSIS DIPSTICK
Bilirubin, UA: NEGATIVE
Glucose, UA: NEGATIVE
Ketones, UA: NEGATIVE
Leukocytes, UA: NEGATIVE
Nitrite, UA: NEGATIVE
PROTEIN UA: NEGATIVE
RBC UA: NEGATIVE
SPEC GRAV UA: 1.02
UROBILINOGEN UA: 1
pH, UA: 7

## 2015-02-01 LAB — HEPATITIS C ANTIBODY: HCV Ab: NEGATIVE

## 2015-02-01 NOTE — Progress Notes (Signed)
   Subjective:    Patient ID: Travis Palmer, male    DOB: 09-Aug-1996, 19 y.o.   MRN: [id number]  HPI    Review of Systems     Objective:   Physical Exam  Constitutional: He is oriented to person, place, and time. He appears well-developed and well-nourished.  HENT:  Head: Normocephalic.  Right Ear: External ear normal.  Left Ear: External ear normal.  Nose: Nose normal.  Mouth/Throat: Oropharynx is clear and moist.  Eyes: Pupils are equal, round, and reactive to light.  Neck: Normal range of motion.  Cardiovascular: Normal rate, regular rhythm and normal heart sounds.   Pulmonary/Chest: Effort normal and breath sounds normal.  Abdominal: Soft. Bowel sounds are normal. There is no tenderness.  Musculoskeletal: Normal range of motion.  Neurological: He is alert and oriented to person, place, and time.  Skin: Skin is warm and dry.  Psychiatric: He has a normal mood and affect. His behavior is normal.          Assessment & Plan:  Travis Palmer is here for entry into study A5353 - Trial to evaluate dolutegravir plus lamivudine dual therapy for the treatment of [facility] HIV-1 infected participants. He was concerned about the blood draws and we reviewed the visits in which he would have this done. He states he would like to continue to enter on study. I emphasized him being on study is completely voluntary and he can choose to withdrawal from study at anytime if he feels differently later on. I entered him onto study. Reviewed for any changes to history since last study visit. Fasting labs were obtained as well as urine sample. We reviewed study medications (side effects, how to administer medications, and he has my contact information for any questions or concerns). He verbally demonstrated how he would take study medications and I acknowledged he was correct. We went to pharmacy and pick up medications. He received study medications plus $50 gift card for study visit. Will call him in one week to  assess adherence and any side effect/questions/concerns. Will see him for a study visit in 2 weeks. Eliezer Champagne RN

## 2015-02-08 ENCOUNTER — Encounter: Payer: Self-pay | Admitting: Infectious Disease

## 2015-02-14 ENCOUNTER — Ambulatory Visit (INDEPENDENT_AMBULATORY_CARE_PROVIDER_SITE_OTHER): Payer: Self-pay | Admitting: *Deleted

## 2015-02-14 VITALS — BP 129/69 | HR 91 | Temp 98.4°F | Resp 16 | Wt 168.5 lb

## 2015-02-14 DIAGNOSIS — Z006 Encounter for examination for normal comparison and control in clinical research program: Secondary | ICD-10-CM

## 2015-02-14 NOTE — Progress Notes (Signed)
[employer] [employer]) is here for her 2 week A5353 study visit. We reviewed her adherence over the past 2 weeks and she says she has missed 2 doses over that time frame. She says that she moves around a lot and it is difficult to get in a routine. We gave her a single dose holder so she can carry that with her when she goes out in case she misses a dose. We discussed adherence strategies and ways to make it easier for her to remember to take her meds. She says she has noticed being a little more constipated since she started them. [name] very interested on starting hormones and wants a referral to someone for that, but I suspect that she will need to get a [facility] orange card first . [name] to come back in 2 weeks for study and has not kept any followup appts with the MD, so will have her see Dr. Tommy Medal that same day.

## 2015-03-02 ENCOUNTER — Ambulatory Visit (INDEPENDENT_AMBULATORY_CARE_PROVIDER_SITE_OTHER): Payer: Medicaid Other | Admitting: Infectious Disease

## 2015-03-02 ENCOUNTER — Encounter: Payer: Self-pay | Admitting: Infectious Disease

## 2015-03-02 VITALS — BP 120/87 | HR 87 | Temp 98.2°F | Wt 165.0 lb

## 2015-03-02 DIAGNOSIS — B20 Human immunodeficiency virus [HIV] disease: Secondary | ICD-10-CM | POA: Diagnosis not present

## 2015-03-02 DIAGNOSIS — F641 Gender identity disorder in adolescence and adulthood: Secondary | ICD-10-CM | POA: Diagnosis not present

## 2015-03-02 DIAGNOSIS — F640 Transsexualism: Secondary | ICD-10-CM

## 2015-03-02 DIAGNOSIS — Z789 Other specified health status: Secondary | ICD-10-CM

## 2015-03-02 MED ORDER — VARENICLINE TARTRATE 1 MG PO TABS: 1.0000 mg | ORAL_TABLET | Freq: Two times a day (BID) | ORAL | Status: DC

## 2015-03-02 MED ORDER — VARENICLINE TARTRATE 0.5 MG X 11 & 1 MG X 42 PO MISC: ORAL | Status: DC

## 2015-03-02 MED ORDER — SPIRONOLACTONE 25 MG PO TABS: 25.0000 mg | ORAL_TABLET | Freq: Every day | ORAL | Status: DC

## 2015-03-02 NOTE — Progress Notes (Signed)
   Subjective:    Patient ID: Travis Palmer, male    DOB: Feb 22, 1996, 19 y.o.   MRN: [id number]  HPI  For details please see NP Student note above.  Travis Palmer returns for followup for HIV care. Her HIV VL has already plummeted 2 weeks into DTG and 3TC which she is receiving via ACTG 5353 down to 703 copies per ml from >100K.  She admits to at times missing a dose perhaps once a week and I exhorted her to "tighten up" her adherence.   [name] under great deal of distress with her relationship with her mother who does not accept her transgender identity.  [name] anxious to start on estrogen therapy but  I have told her that I will not do this UNTIL she has stopped smoking.  I am willing to start aldactone to help with male to male transgender.     Review of Systems  Constitutional: Negative for fever, chills, diaphoresis, activity change, appetite change, fatigue and unexpected weight change.  HENT: Negative for congestion, rhinorrhea, sinus pressure, sneezing, sore throat and trouble swallowing.   Eyes: Negative for photophobia and visual disturbance.  Respiratory: Negative for cough, chest tightness, shortness of breath, wheezing and stridor.   Cardiovascular: Negative for chest pain, palpitations and leg swelling.  Gastrointestinal: Negative for nausea, vomiting, abdominal pain, diarrhea, constipation, blood in stool, abdominal distention and anal bleeding.  Genitourinary: Negative for dysuria, hematuria, flank pain and difficulty urinating.  Musculoskeletal: Negative for myalgias, back pain, joint swelling, arthralgias and gait problem.  Skin: Negative for color change, pallor, rash and wound.  Neurological: Negative for dizziness, tremors, weakness and light-headedness.  Hematological: Negative for adenopathy. Does not bruise/bleed easily.  Psychiatric/Behavioral: Negative for behavioral problems, confusion, sleep disturbance, dysphoric mood, decreased concentration and agitation.        Objective:   Physical Exam  Constitutional: He is oriented to person, place, and time. He appears well-developed and well-nourished.  HENT:  Head: Normocephalic and atraumatic.  Eyes: Conjunctivae and EOM are normal.  Neck: Normal range of motion. Neck supple.  Cardiovascular: Normal rate and regular rhythm.   Pulmonary/Chest: Effort normal. No respiratory distress. He has no wheezes.  Abdominal: Soft. He exhibits no distension.  Musculoskeletal: Normal range of motion. He exhibits no edema or tenderness.  Neurological: He is alert and oriented to person, place, and time.  Skin: Skin is warm and dry. No rash noted. No erythema. No pallor.          Assessment & Plan:   HIV: continue DTG and 3TC and will have additional labs on Monday, Increase adherence  Transgender: start aldactone at 25mg  daily  Smoking: spent greater than 3 minutes on smoking cessation counselling and rx chantix

## 2015-03-02 NOTE — Patient Instructions (Signed)
You will need your potassium rechecked one week after starting the spironolactone  IF you have any light headedness or dizziness while using this medicine please let us know  I have called in rx for chantix to help you with smoking cessation  Make sure to take the DOLUTEGRAVIR (TIVICAY) and Lamivudine (EPIVIR) EVERY DAY

## 2015-03-02 NOTE — Progress Notes (Signed)
   Subjective:    Patient ID: Travis Palmer, male    DOB: Mar 31, 1996, 19 y.o.   MRN: [id number]  HPI Travis Palmer goes by Travis Palmer" is a 19y/o transgender male to male presenting to the clinic for HIV follow up and discussion of initiating hormone therapy as part of her transition, She states she has had some difficulty in taking her medications everyday as she states that her life has been "crazy". She notes that she has had some constipation with her medication regimen but denies that this bothers her. She states her mother has not been receptive to her transitioning to male and she has not disclosed her status to her mother. [name] currently living on her own. She currently smokes but would like to quit.   Review of Systems Constitutional: negative for anorexia, chills, fatigue, fevers, sweats and weight loss Eyes: negative Ears, nose, mouth, throat, and face: negative for dental pain Respiratory: negative Cardiovascular: negative Gastrointestinal: Positive for constipation. Denies abdominal pain and tenderness.  Genitourinary:negative    Objective:   Physical Exam  General: [name] alert and in no distress. Oral: Her gums are swollen and erythematous. No oropharyngeal lesions. Her teeth are in good condition Skin: No rash Lungs: Clear CV: Regular S1 and S2 with no murmurs Breasts: Some breast enlargement Abdomen: Soft and nontender with no palpable masses. Bowel sounds active. Joints and extremities: Normal Neuro: Normal speech, alert and oriented.        Assessment & Plan:   HIV: Continue current regimen. Scheduled for labs on Monday for research study. Transgender: Start 25mg  Spironolactone daily. Recheck K level Monday when returning for HIV labs.  Tobacco abuse: Start Chantix.

## 2015-03-06 ENCOUNTER — Ambulatory Visit: Payer: Medicaid Other

## 2015-03-07 ENCOUNTER — Ambulatory Visit (INDEPENDENT_AMBULATORY_CARE_PROVIDER_SITE_OTHER): Payer: Medicaid Other | Admitting: *Deleted

## 2015-03-07 VITALS — BP 130/79 | HR 76 | Temp 98.2°F | Resp 18 | Wt 171.2 lb

## 2015-03-07 DIAGNOSIS — Z006 Encounter for examination for normal comparison and control in clinical research program: Secondary | ICD-10-CM

## 2015-03-07 LAB — COMPREHENSIVE METABOLIC PANEL
ALBUMIN: 4.5 g/dL (ref 3.5–5.2)
ALK PHOS: 53 U/L (ref 39–117)
ALT: 13 U/L (ref 0–53)
AST: 16 U/L (ref 0–37)
BUN: 16 mg/dL (ref 6–23)
CO2: 26 mEq/L (ref 19–32)
CREATININE: 1.04 mg/dL (ref 0.50–1.35)
Calcium: 9.6 mg/dL (ref 8.4–10.5)
Chloride: 104 mEq/L (ref 96–112)
Glucose, Bld: 70 mg/dL (ref 70–99)
Potassium: 4 mEq/L (ref 3.5–5.3)
Sodium: 138 mEq/L (ref 135–145)
Total Bilirubin: 0.5 mg/dL (ref 0.2–1.2)
Total Protein: 8.1 g/dL (ref 6.0–8.3)

## 2015-03-07 LAB — BILIRUBIN, DIRECT: Bilirubin, Direct: 0.1 mg/dL (ref 0.0–0.3)
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
## 2015-03-10 ENCOUNTER — Other Ambulatory Visit: Payer: Self-pay | Admitting: *Deleted

## 2015-03-10 MED ORDER — DOLUTEGRAVIR SODIUM 50 MG PO TABS: 50.0000 mg | ORAL_TABLET | Freq: Every day | ORAL | Status: DC

## 2015-03-10 MED ORDER — LAMIVUDINE 300 MG PO TABS: 300.0000 mg | ORAL_TABLET | Freq: Every day | ORAL | Status: DC

## 2015-03-13 ENCOUNTER — Ambulatory Visit: Payer: Medicaid Other

## 2015-03-31 ENCOUNTER — Encounter: Payer: Self-pay | Admitting: Infectious Disease

## 2015-03-31 LAB — CD4/CD8 (T-HELPER/T-SUPPRESSOR CELL)
CD4%: 28.2
CD4: 479
CD8 % Suppressor T Cell: 48
CD8: 816

## 2015-03-31 LAB — HIV-1 RNA QUANT-NO REFLEX-BLD
HIV-1 RNA Viral Load: 116
HIV-1 RNA Viral Load: 186465
HIV-1 RNA Viral Load: 709

## 2015-04-05 ENCOUNTER — Encounter (INDEPENDENT_AMBULATORY_CARE_PROVIDER_SITE_OTHER): Payer: Self-pay | Admitting: *Deleted

## 2015-04-05 VITALS — BP 132/83 | HR 63 | Temp 98.0°F | Resp 16 | Wt 170.0 lb

## 2015-04-05 DIAGNOSIS — Z006 Encounter for examination for normal comparison and control in clinical research program: Secondary | ICD-10-CM

## 2015-04-05 NOTE — Progress Notes (Signed)
Travis Palmer is here for her 8 week  Study visit. She started aldactone around the end of march and her breasts have started to enlarge and she says they are tender. She tried Chantix for about 3 weeks but felt "too weird" while taking it and decided to stop. She says she already has cut way back on smoking and knows that she needs to quit before she starts taking any other hormones. She says she has been very adherent with her meds and pill counts are accurate. She will return on May 18th for the next study visit.

## 2015-04-12 LAB — HIV-1 RNA QUANT-NO REFLEX-BLD: HIV 1 RNA VIRAL LOAD: 65

## 2015-04-17 ENCOUNTER — Encounter: Payer: Self-pay | Admitting: Infectious Disease

## 2015-04-17 ENCOUNTER — Ambulatory Visit (INDEPENDENT_AMBULATORY_CARE_PROVIDER_SITE_OTHER): Payer: Medicaid Other | Admitting: Infectious Disease

## 2015-04-17 VITALS — BP 130/79 | HR 78 | Temp 98.1°F | Wt 171.0 lb

## 2015-04-17 DIAGNOSIS — F17210 Nicotine dependence, cigarettes, uncomplicated: Secondary | ICD-10-CM

## 2015-04-17 DIAGNOSIS — Z720 Tobacco use: Secondary | ICD-10-CM | POA: Diagnosis not present

## 2015-04-17 DIAGNOSIS — B20 Human immunodeficiency virus [HIV] disease: Secondary | ICD-10-CM | POA: Diagnosis present

## 2015-04-17 DIAGNOSIS — Z789 Other specified health status: Secondary | ICD-10-CM

## 2015-04-17 DIAGNOSIS — F641 Gender identity disorder in adolescence and adulthood: Secondary | ICD-10-CM | POA: Diagnosis not present

## 2015-04-17 DIAGNOSIS — F640 Transsexualism: Secondary | ICD-10-CM

## 2015-04-17 MED ORDER — ESTRADIOL 2 MG PO TABS: 2.0000 mg | ORAL_TABLET | Freq: Every day | ORAL | Status: DC

## 2015-04-17 NOTE — Progress Notes (Signed)
   Subjective:    Patient ID: Travis Palmer, male    DOB: 1996/01/31, 19 y.o.   MRN: [id number]  HPI   Travis Palmer returns for followup for HIV care. Her HIV VL has already plummeted 2 weeks into DTG and 3TC which she is receiving via ACTG 5353 down to 703 copies per ml from >100K.  Since then however her viral load while coming down has never been perfectly suppressed. Her most recent viral load was 65 but we really should've seen an undetectable viral load among the past several blood draws. She admits to missing some doses of antiretrovirals between and of April and now but not prior to her April 27 blood draw.  [name] quite happy that she is gaining some tissue in her breasts now. She has stopped smoking and is eager to start estrogen therapy.     Review of Systems  Constitutional: Negative for fever, chills, diaphoresis, activity change, appetite change, fatigue and unexpected weight change.  HENT: Negative for congestion, rhinorrhea, sinus pressure, sneezing, sore throat and trouble swallowing.   Eyes: Negative for photophobia and visual disturbance.  Respiratory: Negative for cough, chest tightness, shortness of breath, wheezing and stridor.   Cardiovascular: Negative for chest pain, palpitations and leg swelling.  Gastrointestinal: Negative for nausea, vomiting, abdominal pain, diarrhea, constipation, blood in stool, abdominal distention and anal bleeding.  Genitourinary: Negative for dysuria, hematuria, flank pain and difficulty urinating.  Musculoskeletal: Negative for myalgias, back pain, joint swelling, arthralgias and gait problem.  Skin: Negative for color change, pallor, rash and wound.  Neurological: Negative for dizziness, tremors, weakness and light-headedness.  Hematological: Negative for adenopathy. Does not bruise/bleed easily.  Psychiatric/Behavioral: Negative for behavioral problems, confusion, sleep disturbance, dysphoric mood, decreased concentration and agitation.       Objective:   Physical Exam  Constitutional: He is oriented to person, place, and time. He appears well-developed and well-nourished.  HENT:  Head: Normocephalic and atraumatic.  Eyes: Conjunctivae and EOM are normal.  Neck: Normal range of motion. Neck supple.  Cardiovascular: Normal rate and regular rhythm.   Pulmonary/Chest: Effort normal. No respiratory distress. He has no wheezes.  Abdominal: Soft. He exhibits no distension.  Musculoskeletal: Normal range of motion. He exhibits no edema or tenderness.  Neurological: He is alert and oriented to person, place, and time.  Skin: Skin is warm and dry. No rash noted. No erythema. No pallor.          Assessment & Plan:   HIV: continue DTG and 3TC and will have additional labs on Monday, Increase adherence.   Transgender: increase aldactone to 50 mg daily and recheck metabolic panel in 1 week's time with repeat blood pressure check. I spent greater than 40 minutes with the patient including greater than 50% of time in face to face counsel of the patient regarding nature of HIV infection and need to be highly adherent to antiretroviral medications regarding risks and benefits of treatment for transgender identity including specifically risks associated with estrogen therapy. and in coordination of their care.   Start 2 mg estradiol daily  Smoking: She has stopped smoking

## 2015-04-23 ENCOUNTER — Other Ambulatory Visit: Payer: Self-pay | Admitting: Infectious Disease

## 2015-04-24 ENCOUNTER — Other Ambulatory Visit: Payer: Self-pay | Admitting: *Deleted

## 2015-04-24 NOTE — Telephone Encounter (Signed)
Error

## 2015-04-26 ENCOUNTER — Encounter: Payer: Self-pay | Admitting: Infectious Disease

## 2015-04-26 ENCOUNTER — Ambulatory Visit (INDEPENDENT_AMBULATORY_CARE_PROVIDER_SITE_OTHER): Payer: Medicaid Other | Admitting: Infectious Disease

## 2015-04-26 ENCOUNTER — Encounter (INDEPENDENT_AMBULATORY_CARE_PROVIDER_SITE_OTHER): Payer: Medicaid Other | Admitting: *Deleted

## 2015-04-26 VITALS — BP 132/71 | HR 62 | Temp 98.7°F | Wt 174.0 lb

## 2015-04-26 VITALS — BP 147/72 | HR 60 | Temp 97.8°F | Resp 16 | Wt 168.0 lb

## 2015-04-26 DIAGNOSIS — B20 Human immunodeficiency virus [HIV] disease: Secondary | ICD-10-CM

## 2015-04-26 DIAGNOSIS — Z006 Encounter for examination for normal comparison and control in clinical research program: Secondary | ICD-10-CM

## 2015-04-26 DIAGNOSIS — F641 Gender identity disorder in adolescence and adulthood: Secondary | ICD-10-CM

## 2015-04-26 DIAGNOSIS — Z789 Other specified health status: Secondary | ICD-10-CM

## 2015-04-26 DIAGNOSIS — F640 Transsexualism: Secondary | ICD-10-CM

## 2015-04-26 LAB — COMPLETE METABOLIC PANEL WITH GFR
ALT: 11 U/L (ref 0–53)
AST: 13 U/L (ref 0–37)
Albumin: 4.3 g/dL (ref 3.5–5.2)
Alkaline Phosphatase: 47 U/L (ref 39–117)
BILIRUBIN TOTAL: 0.4 mg/dL (ref 0.2–1.2)
BUN: 12 mg/dL (ref 6–23)
CALCIUM: 9.2 mg/dL (ref 8.4–10.5)
CHLORIDE: 100 meq/L (ref 96–112)
CO2: 29 meq/L (ref 19–32)
Creat: 0.86 mg/dL (ref 0.50–1.35)
GFR, Est Non African American: >89 mL/min
Glucose, Bld: 115 mg/dL — ABNORMAL HIGH (ref 70–99)
Potassium: 4 mEq/L (ref 3.5–5.3)
SODIUM: 133 meq/L — AB (ref 135–145)
TOTAL PROTEIN: 7.6 g/dL (ref 6.0–8.3)

## 2015-04-26 LAB — BILIRUBIN, DIRECT: Bilirubin, Direct: 0.1 mg/dL (ref 0.0–0.3)

## 2015-04-26 MED ORDER — SPIRONOLACTONE 25 MG PO TABS: 50.0000 mg | ORAL_TABLET | Freq: Every day | ORAL | Status: DC

## 2015-04-26 NOTE — Progress Notes (Signed)
   Subjective:    Patient ID: Travis Palmer, male    DOB: 15-Oct-1996, 19 y.o.   MRN: [id number]  HPI   Travis Palmer returns for followup for HIV care.   As mentioned at her last visit 9 days agoher HIV VL had already plummeted 2 weeks into DTG and 3TC which she is receiving via ACTG 5353 down to 703 copies per ml from >100K.  Since then however her viral load while coming down has never been perfectly suppressed. Her most recent viral load was 65 but we really should've seen an undetectable viral load among the past several blood draws. She admits to missing some doses of antiretrovirals between and of April and now but not prior to her April 27 blood draw.  I had increased her aldactone dose to 50mg  but she had never went up to 50mg  since pharmacy did not give her the 50mg  tablets (even though I had said to take two 25mg  tablets in the meantime) I also had added estradiol  Purpose of todays visit was for BP check by RN and lab check but again that fact and the instructions re increased aldactone were misunderstood.     Review of Systems  Constitutional: Negative for fever, chills, diaphoresis, activity change, appetite change, fatigue and unexpected weight change.  HENT: Negative for congestion, rhinorrhea, sinus pressure, sneezing, sore throat and trouble swallowing.   Eyes: Negative for photophobia and visual disturbance.  Respiratory: Negative for cough, chest tightness, shortness of breath, wheezing and stridor.   Cardiovascular: Negative for chest pain, palpitations and leg swelling.  Gastrointestinal: Negative for nausea, vomiting, abdominal pain, diarrhea, constipation, blood in stool, abdominal distention and anal bleeding.  Genitourinary: Negative for dysuria, hematuria, flank pain and difficulty urinating.  Musculoskeletal: Negative for myalgias, back pain, joint swelling, arthralgias and gait problem.  Skin: Negative for color change, pallor, rash and wound.  Neurological: Negative  for dizziness, tremors, weakness and light-headedness.  Hematological: Negative for adenopathy. Does not bruise/bleed easily.  Psychiatric/Behavioral: Negative for behavioral problems, confusion, sleep disturbance, dysphoric mood, decreased concentration and agitation.       Objective:   Physical Exam  Constitutional: He is oriented to person, place, and time. He appears well-developed and well-nourished.  HENT:  Head: Normocephalic and atraumatic.  Eyes: Conjunctivae and EOM are normal.  Neck: Normal range of motion. Neck supple.  Cardiovascular: Normal rate and regular rhythm.   Pulmonary/Chest: Effort normal. No respiratory distress. He has no wheezes.  Abdominal: Soft. He exhibits no distension.  Musculoskeletal: Normal range of motion. He exhibits no edema or tenderness.  Neurological: He is alert and oriented to person, place, and time.  Skin: Skin is warm and dry. No rash noted. No erythema. No pallor.          Assessment & Plan:   HIV: continue DTG and 3TC and will have additional labs that SHOULD show VL <40, Emphasized adherence.  Transgender: increase aldactone to 50 mg daily and recheck metabolic panel in 1 week's time with repeat blood pressure check.    Smoking: She has stopped smoking

## 2015-04-26 NOTE — Progress Notes (Signed)
Amber is here for A5353, week 23. She has a new cell phone number which I have updated in our system. [name] doing well and states she has not missed one dose. Her Aldactone was increased from 25mg  to 50mg  daily and she has started taking Estradial. I congratulated her on doing so well with taking her medications and making her appointments. Fasting labs were drawn. Pill count performed with #9 Tivicay and #10 Epivir. Study medications were dispensed. Blood draw performed. She states she has not missed any study medications. Her assessment is unchanged since last study visit. She received $50 gift card for visit and next appointment is June 15th at 10am. Eliezer Champagne RN

## 2015-04-26 NOTE — Patient Instructions (Signed)
RN VISIT FOR BP CHECK AND BMP CHECK ON HIGHER DOSE ALDACTONE

## 2015-04-28 LAB — CD4/CD8 (T-HELPER/T-SUPPRESSOR CELL)
CD4%: 30.5
CD4: 427
CD8 T CELL SUPPRESSOR: 42.7
CD8: 598

## 2015-05-11 LAB — HIV-1 RNA QUANT-NO REFLEX-BLD: HIV 1 RNA VIRAL LOAD: 58

## 2015-05-15 ENCOUNTER — Ambulatory Visit: Payer: Medicaid Other

## 2015-05-15 DIAGNOSIS — F331 Major depressive disorder, recurrent, moderate: Secondary | ICD-10-CM
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
## 2015-05-18 ENCOUNTER — Telehealth: Payer: Self-pay | Admitting: *Deleted

## 2015-05-18 NOTE — Telephone Encounter (Signed)
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 WOuld be good idea to use condoms

## 2015-05-18 NOTE — Telephone Encounter (Signed)
Travis Palmer called concerned about a rash that had presented itself about a week ago. She states it was all over but now is mainly prevalent in her groin area and on the palms of both of her hands. She states it does itch sometimes but really doesn't bother her. She has had unprotected sex in the past 3-6 wks and she states she did have a sore on her penis that has resolved. Our clinic does not have any open slots for her to come in and be seen. Last RPR in Dec.2015 was negative. I told her that her best option is to go to the health department and get STI testing and if positive she could receive treatment there. [name] to be seen next week by research. Also stressed the importance of condom use. Told her to call if she had any other issues or concerns. Eliezer Champagne RN

## 2015-05-22 ENCOUNTER — Ambulatory Visit: Payer: Medicaid Other

## 2015-05-22 DIAGNOSIS — F329 Major depressive disorder, single episode, unspecified: Secondary | ICD-10-CM

## 2015-05-22 DIAGNOSIS — F32A Depression, unspecified: Secondary | ICD-10-CM

## 2015-05-22 NOTE — BH Specialist Note (Signed)
Travis Palmer talked about being depressed lately and how she wants to have a better life, but sometimes can't figure out how to get where she wants to be.  [name] concerned that she may have contracted syphilis and plans to get checked for this.  We completed a treatment plan with a goal of improving her mood, which is currently at a 3 on a 10 pt. Scale (10 is best).  She laid down on the couch at one point and fell asleep in just a few seconds.  I had to wake her to sign the treatment plan.  Plan to meet again in one week. [name], LCSW

## 2015-05-24 ENCOUNTER — Encounter (INDEPENDENT_AMBULATORY_CARE_PROVIDER_SITE_OTHER): Payer: Medicaid Other | Admitting: *Deleted

## 2015-05-24 VITALS — BP 113/71 | HR 72 | Temp 98.1°F | Resp 16 | Wt 175.8 lb

## 2015-05-24 DIAGNOSIS — Z006 Encounter for examination for normal comparison and control in clinical research program: Secondary | ICD-10-CM | POA: Diagnosis not present

## 2015-05-24 DIAGNOSIS — Z202 Contact with and (suspected) exposure to infections with a predominantly sexual mode of transmission: Secondary | ICD-10-CM | POA: Diagnosis not present

## 2015-05-24 DIAGNOSIS — B20 Human immunodeficiency virus [HIV] disease: Secondary | ICD-10-CM | POA: Diagnosis present

## 2015-05-24 MED ORDER — PENICILLIN G BENZATHINE 1200000 UNIT/2ML IM SUSP
1.2000 10*6.[IU] | Freq: Once | INTRAMUSCULAR | Status: AC
  Administered 2015-05-24: 1.2 10*6.[IU] via INTRAMUSCULAR

## 2015-05-24 MED ORDER — PENICILLIN G BENZATHINE 1200000 UNIT/2ML IM SUSP
1.2000 10*6.[IU] | Freq: Once | INTRAMUSCULAR | Status: AC
Start: 2015-05-24 — End: 2015-05-24
  Administered 2015-05-24: 1.2 10*6.[IU] via INTRAMUSCULAR

## 2015-05-24 NOTE — Progress Notes (Signed)
Travis Palmer is here today for her 16 week study visit. She has a rash that appears to be healing noted on her palms, soles of her feet and in her perineal area. She says it does not itch. She never went by the health department she said because she trusted [place] more to treat her. I did tell her it looked like a classic case of syphilis and she admits to having unprotected sex at the beach several weeks ago. 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 We did talk about how risky her life style is and trying to make changes in her life. Her adherence has been less than perfect with having 7 pills from each bottleleft over f, when she should have only had 1 left. She says her life is very chaotic and she never has been able to take them on a regular schedule. I did get an order from Dr. Johnnye Sima for bicillin LA 2.4 mill units in 2 injections, which I did giver her. We also drew an RPR, last one was in December which was negative. She will return in 4 weeks for the next study visit.

## 2015-05-25 LAB — RPR: RPR: REACTIVE — AB

## 2015-05-25 LAB — FLUORESCENT TREPONEMAL AB(FTA)-IGG-BLD: Fluorescent Treponemal ABS: REACTIVE — AB

## 2015-05-25 LAB — RPR TITER

## 2015-05-29 ENCOUNTER — Ambulatory Visit: Payer: Medicaid Other

## 2015-05-29 DIAGNOSIS — F329 Major depressive disorder, single episode, unspecified: Secondary | ICD-10-CM

## 2015-05-29 DIAGNOSIS — F32A Depression, unspecified: Secondary | ICD-10-CM

## 2015-05-29 NOTE — BH Specialist Note (Signed)
Travis Palmer was quite pleasant today, but continues to report that "life sucks" and rates her mood at 3 or so on a 10 pt scale.  She reported that she is starting school July 5th, but realizes that she'll have to get up early to catch the bus, since classes start at 8:30 am.  [name] also having to move out next week she said and isn't sure where she will go.  She talked about her history of psychiatric treatment and said she is definitely ADHD, but isn't so sure she has Bipolar Disorder.  I provided psycho-education on mindfulness and she was resistant at first, but I assured her that everyone has "racing thoughts" from time to time and that this doesn't mean you cannot simply observe them.  I facilitated a 2 minute guided meditation and she liked it.  I also told her about mindfulness apps she can download to her iPhone.  Plan to meet again in one week. [name], LCSW

## 2015-05-31 ENCOUNTER — Telehealth: Payer: Self-pay | Admitting: *Deleted

## 2015-05-31 NOTE — Telephone Encounter (Signed)
Travis Palmer, [facility] in office to follow up on most recent RPR. Patient treated with IM Bicillin 2.4 units x1.   He can be reached at [phone] if there are any questions. [name], RN

## 2015-06-02 LAB — HIV-1 RNA QUANT-NO REFLEX-BLD: HIV-1 RNA Viral Load: <40

## 2015-06-05 ENCOUNTER — Ambulatory Visit: Payer: Medicaid Other

## 2015-06-19 ENCOUNTER — Ambulatory Visit: Payer: Medicaid Other

## 2015-06-19 ENCOUNTER — Encounter (INDEPENDENT_AMBULATORY_CARE_PROVIDER_SITE_OTHER): Payer: Self-pay | Admitting: *Deleted

## 2015-06-19 VITALS — BP 131/75 | HR 68 | Temp 97.6°F | Resp 16 | Wt 170.0 lb

## 2015-06-19 DIAGNOSIS — F329 Major depressive disorder, single episode, unspecified: Secondary | ICD-10-CM

## 2015-06-19 DIAGNOSIS — F32A Depression, unspecified: Secondary | ICD-10-CM

## 2015-06-19 DIAGNOSIS — Z006 Encounter for examination for normal comparison and control in clinical research program: Secondary | ICD-10-CM

## 2015-06-19 NOTE — BH Specialist Note (Signed)
Travis Palmer was very sleepy at the beginning of the session, but slowly woke up and became more engaged in it, venting about her mother, who, according to her, always ends up causing "drama".  They went in together on renting a car over the weekend, but her mother accused her of not paying enough and this frustrated Museum/gallery conservator extremely.  She said her father told her years ago that she would do better to distance herself from her mother, which she says she now understands why he said that.  She also said she is considering "taking off the wig", and returning to life as a gay male, if that's what it takes to get it together - getting a job, Social research officer, government.  We talked about the difficulty of being transgender in society today and she is ambivalent about it at this point.  Plan to meet in one week. [name], LCSW

## 2015-06-19 NOTE — Progress Notes (Signed)
Travis Palmer is here for her week 31 A5353 study visit. She says she has been very adherent with her meds and by pill counts she has been, except she had 4 of the tivicay left over and 5 of the lamivudine. She says she always takes them together. She apparently stopped taking her aldactone and was thinking it wasn't helping. But after discussing the purpose of the hormonal medications, she will resume. She has been taking the estradiol everyday. [name] scheduled to see Dr. Tommy Medal on the 20th of July. She says the rash from the syphilis is almost gone. There is still some residual rash on her feet. We did discuss safe sex practices. She will return in 4 weeks for the next study visit.

## 2015-06-20 ENCOUNTER — Encounter: Payer: Self-pay | Admitting: Infectious Disease

## 2015-06-28 ENCOUNTER — Ambulatory Visit: Payer: Medicaid Other | Admitting: Infectious Disease

## 2015-07-19 ENCOUNTER — Encounter (INDEPENDENT_AMBULATORY_CARE_PROVIDER_SITE_OTHER): Payer: Medicaid Other | Admitting: *Deleted

## 2015-07-19 ENCOUNTER — Encounter (INDEPENDENT_AMBULATORY_CARE_PROVIDER_SITE_OTHER): Payer: Self-pay

## 2015-07-19 VITALS — BP 123/77 | HR 56 | Temp 97.7°F | Resp 18 | Wt 168.5 lb

## 2015-07-19 DIAGNOSIS — Z006 Encounter for examination for normal comparison and control in clinical research program: Secondary | ICD-10-CM

## 2015-07-19 LAB — COMPREHENSIVE METABOLIC PANEL
ALBUMIN: 4.7 g/dL (ref 3.6–5.1)
ALT: 11 U/L (ref 9–46)
AST: 15 U/L (ref 10–40)
Alkaline Phosphatase: 50 U/L (ref 40–115)
BILIRUBIN TOTAL: 0.5 mg/dL (ref 0.2–1.2)
BUN: 13 mg/dL (ref 7–25)
CO2: 28 mmol/L (ref 20–31)
Calcium: 9.6 mg/dL (ref 8.6–10.3)
Chloride: 103 mmol/L (ref 98–110)
Creat: 1.02 mg/dL (ref 0.60–1.35)
GLUCOSE: 85 mg/dL (ref 65–99)
Potassium: 3.9 mmol/L (ref 3.5–5.3)
Sodium: 140 mmol/L (ref 135–146)
TOTAL PROTEIN: 7.4 g/dL (ref 6.1–8.1)

## 2015-07-19 LAB — BILIRUBIN, DIRECT: Bilirubin, Direct: 0.1 mg/dL (ref <=0.2)

## 2015-07-19 LAB — PHOSPHORUS: Phosphorus: 4.8 mg/dL — ABNORMAL HIGH (ref 2.5–4.5)

## 2015-07-19 NOTE — Progress Notes (Signed)
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 She did not bring her medications with her therefore pill count was not performed. She denies any new symptoms and rash on hands and feet from syphilis diagnosis has resolved. [name] wanting to move to [place] in hopes of more resources for LGBT. She says it is hard to find a job in [place] that is accepting of her appearance. She states that she does not want to take her wig off and dress like a man in order to have a job. I spoke with Mitch and he has given her misc. Resources such as housing, job resources, Social research officer, government. I told her I would see if there is a Oncologist site that is participating in the study she is on so that we could transfer her. Non-fasting labs were drawn and vital stable. Study meds were dispensed and she received $50 gift card for visit. Will see her in 2 months. Eliezer Champagne RN

## 2015-08-02 ENCOUNTER — Encounter: Payer: Self-pay | Admitting: Infectious Disease

## 2015-08-02 ENCOUNTER — Ambulatory Visit (INDEPENDENT_AMBULATORY_CARE_PROVIDER_SITE_OTHER): Payer: Medicaid Other | Admitting: Infectious Disease

## 2015-08-02 VITALS — BP 135/80 | HR 64 | Temp 98.1°F | Wt 166.5 lb

## 2015-08-02 DIAGNOSIS — F641 Gender identity disorder in adolescence and adulthood: Secondary | ICD-10-CM

## 2015-08-02 DIAGNOSIS — B20 Human immunodeficiency virus [HIV] disease: Secondary | ICD-10-CM

## 2015-08-02 DIAGNOSIS — A539 Syphilis, unspecified: Secondary | ICD-10-CM

## 2015-08-02 DIAGNOSIS — F640 Transsexualism: Secondary | ICD-10-CM

## 2015-08-02 DIAGNOSIS — Z789 Other specified health status: Secondary | ICD-10-CM

## 2015-08-02 DIAGNOSIS — Z23 Encounter for immunization: Secondary | ICD-10-CM

## 2015-08-02 HISTORY — DX: Syphilis, unspecified: A53.9

## 2015-08-02 NOTE — Progress Notes (Signed)
   Subjective:    Patient ID: Travis Palmer, male    DOB: 1996-08-16, 19 y.o.   MRN: [id number]  HPI   Travis Palmer returns for followup for HIV care.   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  HENT: Negative for congestion, rhinorrhea, sinus pressure, sneezing, sore throat and trouble swallowing.   Eyes: Negative for photophobia and visual disturbance.  Respiratory: Negative for cough, chest tightness, shortness of breath, wheezing and stridor.   Cardiovascular: Negative for chest pain, palpitations and leg swelling.  Gastrointestinal: Negative for nausea, vomiting, abdominal pain, diarrhea, constipation, blood in stool, abdominal distention and anal bleeding.  Genitourinary: Negative for dysuria, hematuria, flank pain and difficulty urinating.  Musculoskeletal: Negative for myalgias, back pain, joint swelling, arthralgias and gait problem.  Skin: Negative for color change, pallor, rash and wound.  Neurological: Negative for dizziness, tremors, weakness and light-headedness.  Hematological: Negative for adenopathy. Does not bruise/bleed easily.  Psychiatric/Behavioral: Positive for dysphoric mood and decreased concentration. Negative for behavioral problems, confusion, sleep disturbance and agitation.       Objective:   Physical Exam  Constitutional: He is oriented to person, place, and time. He appears well-developed and well-nourished.  HENT:  Head: Normocephalic and atraumatic.  Eyes: Conjunctivae and EOM are normal.  Neck: Normal range of motion. Neck supple.  Cardiovascular:  Normal rate and regular rhythm.   Pulmonary/Chest: Effort normal. No respiratory distress. He has no wheezes.  Abdominal: Soft. He exhibits no distension.  Musculoskeletal: Normal range of motion. He exhibits no edema or tenderness.  Neurological: He is alert and oriented to person, place, and time.  Skin: Skin is warm and dry. No rash noted. No erythema. No pallor.  Psychiatric: He is hyperactive.  Nursing note reviewed.         Assessment & Plan:   HIV: continue DTG and 3TC via ACTG and VL <40, Emphasized adherence. She admits to missing a dose sometimes every week but states that as soon as she realizes she had missed her prior dose she takes a dose of both meds  Transgender: increased aldactone to 50 mg daily and K is within normal and BP normal at last check  I would try to push this further first before changing hormonal therapy  Syphilis: sp mx treatments  I spent greater than 25 minutes with the patient including greater than 50% of time in face to face counsel of the patient re her HIV, syphilis, transgender identity and in coordination of their care.

## 2015-08-03 ENCOUNTER — Encounter: Payer: Self-pay | Admitting: Infectious Disease

## 2015-08-03 DIAGNOSIS — Z23 Encounter for immunization: Secondary | ICD-10-CM | POA: Diagnosis not present

## 2015-08-03 DIAGNOSIS — B20 Human immunodeficiency virus [HIV] disease: Secondary | ICD-10-CM | POA: Diagnosis present

## 2015-08-03 NOTE — Addendum Note (Signed)
Addended by: Jarrett Ables D on: 08/03/2015 08:08 AM   Modules accepted: Medications

## 2015-08-09 ENCOUNTER — Encounter: Payer: Self-pay | Admitting: Infectious Disease

## 2015-08-09 LAB — CD4/CD8 (T-HELPER/T-SUPPRESSOR CELL)
CD4%: 27.6
CD4: 524
CD8 % Suppressor T Cell: 43.8
CD8: 832

## 2015-08-09 LAB — HIV-1 RNA QUANT-NO REFLEX-BLD
HIV-1 RNA Viral Load: 43
HIV-1 RNA Viral Load: <40

## 2015-08-28 ENCOUNTER — Ambulatory Visit: Payer: Medicaid Other | Admitting: Infectious Disease

## 2015-09-26 ENCOUNTER — Encounter (INDEPENDENT_AMBULATORY_CARE_PROVIDER_SITE_OTHER): Payer: Self-pay | Admitting: *Deleted

## 2015-09-26 VITALS — BP 127/76 | HR 59 | Temp 98.2°F | Resp 16 | Wt 167.5 lb

## 2015-09-26 DIAGNOSIS — Z006 Encounter for examination for normal comparison and control in clinical research program: Secondary | ICD-10-CM

## 2015-09-26 LAB — COMPREHENSIVE METABOLIC PANEL
ALBUMIN: 4.7 g/dL (ref 3.6–5.1)
ALT: 49 U/L — ABNORMAL HIGH (ref 9–46)
AST: 16 U/L (ref 10–40)
Alkaline Phosphatase: 49 U/L (ref 40–115)
BILIRUBIN TOTAL: 0.5 mg/dL (ref 0.2–1.2)
BUN: 9 mg/dL (ref 7–25)
CALCIUM: 9.3 mg/dL (ref 8.6–10.3)
CHLORIDE: 103 mmol/L (ref 98–110)
CO2: 28 mmol/L (ref 20–31)
CREATININE: 0.87 mg/dL (ref 0.60–1.35)
Glucose, Bld: 93 mg/dL (ref 65–99)
Potassium: 4.4 mmol/L (ref 3.5–5.3)
SODIUM: 139 mmol/L (ref 135–146)
TOTAL PROTEIN: 7.2 g/dL (ref 6.1–8.1)

## 2015-09-26 LAB — BILIRUBIN, DIRECT: BILIRUBIN DIRECT: 0.2 mg/dL (ref <=0.2)

## 2015-09-26 LAB — PHOSPHORUS: PHOSPHORUS: 3.5 mg/dL (ref 2.5–4.5)

## 2015-09-26 LAB — HIV-1 RNA QUANT-NO REFLEX-BLD: HIV-1 RNA Viral Load: 175

## 2015-09-26 NOTE — Addendum Note (Signed)
Addended by: Bobbie Stack on: 09/26/2015 04:54 PM   Modules accepted: Orders

## 2015-09-26 NOTE — Progress Notes (Signed)
Travis Palmer is here for her week 61 A5353 visit for A5353, a Study to Evaluate Dolutegravir and lamivudine Dual Therapy for the Treatment of Naive HIV-1 Infected Participants. She denies any new problems or concerns right now. She says she has been going to school for Probation officer at [employer] and has a job lined up in retail. [name] actually 2 weeks over due for her appt. We had been trying to reach her to schedule something and when she realized she had run out of meds today, she called [place] back. She has meds now for the next 8 weeks. She needs to schedule an MD visit and will be coming off study in 20 weeks.

## 2015-11-06 ENCOUNTER — Encounter (INDEPENDENT_AMBULATORY_CARE_PROVIDER_SITE_OTHER): Payer: Medicaid Other | Admitting: *Deleted

## 2015-11-06 VITALS — BP 125/76 | HR 60 | Temp 98.1°F | Resp 17 | Wt 166.0 lb

## 2015-11-06 DIAGNOSIS — Z006 Encounter for examination for normal comparison and control in clinical research program: Secondary | ICD-10-CM

## 2015-11-06 DIAGNOSIS — R300 Dysuria: Secondary | ICD-10-CM

## 2015-11-06 LAB — COMPREHENSIVE METABOLIC PANEL
ALBUMIN: 4.8 g/dL (ref 3.6–5.1)
ALK PHOS: 55 U/L (ref 40–115)
ALT: 12 U/L (ref 9–46)
AST: 13 U/L (ref 10–40)
BILIRUBIN TOTAL: 0.5 mg/dL (ref 0.2–1.2)
BUN: 11 mg/dL (ref 7–25)
CO2: 29 mmol/L (ref 20–31)
Calcium: 9.4 mg/dL (ref 8.6–10.3)
Chloride: 102 mmol/L (ref 98–110)
Creat: 0.97 mg/dL (ref 0.60–1.35)
Glucose, Bld: 101 mg/dL — ABNORMAL HIGH (ref 65–99)
Potassium: 4.1 mmol/L (ref 3.5–5.3)
SODIUM: 139 mmol/L (ref 135–146)
TOTAL PROTEIN: 7.4 g/dL (ref 6.1–8.1)

## 2015-11-06 LAB — CBC WITH DIFFERENTIAL/PLATELET
BASOS ABS: 0 10*3/uL (ref 0.0–0.1)
Basophils Relative: 1 % (ref 0–1)
Eosinophils Absolute: 0 10*3/uL (ref 0.0–0.7)
Eosinophils Relative: 1 % (ref 0–5)
HEMATOCRIT: 42.3 % (ref 39.0–52.0)
HEMOGLOBIN: 14.7 g/dL (ref 13.0–17.0)
LYMPHS ABS: 1.7 10*3/uL (ref 0.7–4.0)
LYMPHS PCT: 43 % (ref 12–46)
MCH: 33.3 pg (ref 26.0–34.0)
MCHC: 34.8 g/dL (ref 30.0–36.0)
MCV: 95.9 fL (ref 78.0–100.0)
MPV: 9.3 fL (ref 8.6–12.4)
Monocytes Absolute: 0.3 10*3/uL (ref 0.1–1.0)
Monocytes Relative: 8 % (ref 3–12)
NEUTROS ABS: 1.9 10*3/uL (ref 1.7–7.7)
NEUTROS PCT: 47 % (ref 43–77)
Platelets: 315 10*3/uL (ref 150–400)
RBC: 4.41 MIL/uL (ref 4.22–5.81)
RDW: 12.7 % (ref 11.5–15.5)
WBC: 4 10*3/uL (ref 4.0–10.5)

## 2015-11-06 LAB — HIV-1 RNA QUANT-NO REFLEX-BLD: HIV-1 RNA Viral Load: 50

## 2015-11-06 LAB — BILIRUBIN, DIRECT: Bilirubin, Direct: 0.1 mg/dL (ref <=0.2)

## 2015-11-06 LAB — PHOSPHORUS: Phosphorus: 3.5 mg/dL (ref 2.5–4.5)

## 2015-11-06 MED ORDER — ESTRADIOL 2 MG PO TABS: 2.0000 mg | ORAL_TABLET | Freq: Every day | ORAL | Status: DC

## 2015-11-06 MED ORDER — SPIRONOLACTONE 25 MG PO TABS: 50.0000 mg | ORAL_TABLET | Freq: Every day | ORAL | Status: DC

## 2015-11-07 LAB — URINALYSIS, ROUTINE W REFLEX MICROSCOPIC
Bilirubin Urine: NEGATIVE
Glucose, UA: NEGATIVE
HGB URINE DIPSTICK: NEGATIVE
KETONES UR: NEGATIVE
Leukocytes, UA: NEGATIVE
NITRITE: NEGATIVE
Protein, ur: NEGATIVE
Specific Gravity, Urine: 1.025 (ref 1.001–1.035)
pH: 8 (ref 5.0–8.0)

## 2015-11-07 NOTE — Progress Notes (Signed)
Amber is here for A5353, week 51. She c/o a tender right index finger that she punctured with a clothes sensor at her work (forever 21). It is swollen, it is NOT warm to touch. She denies any drainage. Dr. Linus Salmons looked at it and suggested urgent care if it gets bigger and/or increases in discomfort. She also complains of burning sensation when she urinates. She denies any condomless sex and does not feel she needs to be checked for STDs....will get a UA and go from there. Karn Pickler (Engineer, materials) is filling out paperwork with her for [facility] and will be coming back in early December. She will also fill out application for ADAP/RW to be prepared for when study ends. She did not bring her medication with her. Study meds were dispensed. She received $50 gift card for study visit. Next appointment in January and will also see Dr. Tommy Medal for 102mos f/u. Eliezer Champagne RN

## 2015-11-08 ENCOUNTER — Emergency Department (INDEPENDENT_AMBULATORY_CARE_PROVIDER_SITE_OTHER)
Admission: EM | Admit: 2015-11-08 | Discharge: 2015-11-08 | Disposition: A | Discharge status: Home / Self Care | Payer: Self-pay | Source: Home / Self Care | Attending: Emergency Medicine | Admitting: Emergency Medicine

## 2015-11-08 ENCOUNTER — Encounter: Payer: Self-pay | Admitting: Infectious Disease

## 2015-11-08 ENCOUNTER — Encounter (HOSPITAL_COMMUNITY): Payer: Self-pay | Admitting: Emergency Medicine

## 2015-11-08 DIAGNOSIS — L03011 Cellulitis of right finger: Secondary | ICD-10-CM

## 2015-11-08 MED ORDER — TRAMADOL HCL 50 MG PO TABS: 50.0000 mg | ORAL_TABLET | Freq: Four times a day (QID) | ORAL | Status: DC | PRN

## 2015-11-08 MED ORDER — LIDOCAINE-EPINEPHRINE-TETRACAINE (LET) SOLUTION
NASAL | Status: AC
  Filled 2015-11-08: qty 3

## 2015-11-08 MED ORDER — LIDOCAINE HCL (PF) 2 % IJ SOLN
INTRAMUSCULAR | Status: AC
  Filled 2015-11-08: qty 2

## 2015-11-08 MED ORDER — CEPHALEXIN 500 MG PO CAPS: 500.0000 mg | ORAL_CAPSULE | Freq: Four times a day (QID) | ORAL | Status: DC

## 2015-11-08 NOTE — ED Notes (Signed)
The patient presented to the [facility] with a complaint of an injury to the right middle finger. The patient stated that a metal clothing sensor "pocked" his finger about a week ago. The patient stated that he could not remember when his last tdap shot was given.

## 2015-11-08 NOTE — Discharge Instructions (Signed)
You have an infection called paronychia. We drained the pus today. Please take Keflex one pill 4 times a day for the next week. The tramadol every 8 hours as needed for pain. Soak the finger in warm water at least 3 times a day. Follow-up as needed.

## 2015-11-08 NOTE — ED Provider Notes (Signed)
CSN: [id number]     Arrival date & time 11/08/15  80 History   First MD Initiated Contact with Patient 11/08/15 1927     Chief Complaint  Patient presents with  . Finger Injury   (Consider location/radiation/quality/duration/timing/severity/associated sxs/prior Treatment) HPI  [name] a 19 year old woman here for evaluation of right middle finger pain. [name] transgendered and goes by [place].  She states her finger got pricked at work. She has developed swelling at the nail edge. It is very tender. It throbs. She has not tried anything. She does not know when her last tetanus shot was.  Past Medical History  Diagnosis Date  . History of bipolar disorder   . HIV infection ([place])   . Syphilis 08/02/2015   Past Surgical History  Procedure Laterality Date  . Wisdom tooth extraction  2014   Family History  Problem Relation Age of Onset  . Lactose intolerance Mother   . Cancer Maternal Aunt   . Hypertension Maternal Grandmother    Social History  Substance Use Topics  . Smoking status: Former Smoker -- 0.00 packs/day  . Smokeless tobacco: None  . Alcohol Use: No     Comment: weekend drinker    Review of Systems As in history of present illness Allergies  Depakote  Home Medications   Prior to Admission medications   Medication Sig Start Date End Date Taking? Authorizing Provider  cephALEXin (KEFLEX) 500 MG capsule Take 1 capsule (500 mg total) by mouth 4 (four) times daily. 11/08/15   Melony Overly, MD  dolutegravir (TIVICAY) 50 MG tablet Take 1 tablet (50 mg total) by mouth daily. 01/31/15   Michel Bickers, MD  estradiol (ESTRACE) 2 MG tablet Take 1 tablet (2 mg total) by mouth daily. 11/06/15   Truman Hayward, MD  lamivudine (EPIVIR) 300 MG tablet Take 1 tablet (300 mg total) by mouth daily. 03/10/15   Michel Bickers, MD  spironolactone (ALDACTONE) 25 MG tablet Take 2 tablets (50 mg total) by mouth daily. 11/06/15   Truman Hayward, MD  traMADol (ULTRAM) 50 MG tablet  Take 1 tablet (50 mg total) by mouth every 6 (six) hours as needed. 11/08/15   Melony Overly, MD   Meds Ordered and Administered this Visit  Medications - No data to display  BP 129/69 mmHg  Pulse 65  Temp(Src) 98.3 F (36.8 C) (Oral)  Resp 18  SpO2 99% No data found.   Physical Exam  Constitutional: He is oriented to person, place, and time. He appears well-developed and well-nourished.  Cardiovascular: Normal rate.   Pulmonary/Chest: Effort normal.  Musculoskeletal:  Right middle finger: There is swelling and erythema at the radial nail edge. Is very tender. There is fluctuance.  Neurological: He is alert and oriented to person, place, and time.    ED Course  .Marland KitchenIncision and Drainage Date/Time: 11/08/2015 8:21 PM Performed by: Melony Overly Authorized by: Melony Overly Consent: Verbal consent obtained. Risks and benefits: risks, benefits and alternatives were discussed Consent given by: patient Patient understanding: patient states understanding of the procedure being performed Patient identity confirmed: verbally with patient Time out: Immediately prior to procedure a "time out" was called to verify the correct patient, procedure, equipment, support staff and site/side marked as required. Type: abscess Body area: upper extremity Location details: right long finger Local anesthetic: LET (lido,epi,tetracaine) Scalpel size: 11 Incision type: single straight Complexity: simple Drainage: purulent Drainage amount: moderate Wound treatment: wound left open Patient tolerance: Patient  tolerated the procedure well with no immediate complications   (including critical care time)  Labs Review Labs Reviewed - No data to display  Imaging Review No results found.    MDM   1. Paronychia, right    Drained today. Prescription given for Keflex. Tramadol as needed for pain. Warm soaks 3 times a day. Follow-up as needed.    Melony Overly, MD 11/08/15 [phone]

## 2015-11-27 ENCOUNTER — Encounter: Payer: Self-pay | Admitting: Infectious Disease

## 2016-01-01 ENCOUNTER — Telehealth: Payer: Self-pay | Admitting: *Deleted

## 2016-01-01 ENCOUNTER — Ambulatory Visit: Payer: Medicaid Other | Admitting: Infectious Disease

## 2016-01-01 NOTE — Telephone Encounter (Signed)
Attempted to call patient to inform of missed visit.  Main number is not in service, backup number does not have the patient's name on voicemail.  Unable to leave message. [name], RN

## 2016-01-17 ENCOUNTER — Ambulatory Visit: Payer: Self-pay

## 2016-02-28 ENCOUNTER — Ambulatory Visit: Payer: Self-pay | Admitting: Infectious Disease

## 2016-04-27 ENCOUNTER — Other Ambulatory Visit: Payer: Self-pay | Admitting: Infectious Disease

## 2016-04-27 DIAGNOSIS — F640 Transsexualism: Secondary | ICD-10-CM

## 2016-04-27 DIAGNOSIS — Z789 Other specified health status: Secondary | ICD-10-CM

## 2016-08-08 ENCOUNTER — Other Ambulatory Visit: Payer: Self-pay | Admitting: Infectious Disease

## 2016-08-08 ENCOUNTER — Other Ambulatory Visit: Payer: Self-pay

## 2016-08-08 DIAGNOSIS — Z113 Encounter for screening for infections with a predominantly sexual mode of transmission: Secondary | ICD-10-CM

## 2016-08-08 DIAGNOSIS — B20 Human immunodeficiency virus [HIV] disease: Secondary | ICD-10-CM

## 2016-08-08 LAB — COMPLETE METABOLIC PANEL WITH GFR
ALBUMIN: 4.2 g/dL (ref 3.6–5.1)
ALK PHOS: 51 U/L (ref 40–115)
ALT: 17 U/L (ref 9–46)
AST: 13 U/L (ref 10–40)
BILIRUBIN TOTAL: 0.4 mg/dL (ref 0.2–1.2)
BUN: 8 mg/dL (ref 7–25)
CALCIUM: 8.9 mg/dL (ref 8.6–10.3)
CO2: 27 mmol/L (ref 20–31)
Chloride: 106 mmol/L (ref 98–110)
Creat: 0.8 mg/dL (ref 0.60–1.35)
GFR, Est African American: >89 mL/min (ref >=60)
Glucose, Bld: 86 mg/dL (ref 65–99)
POTASSIUM: 4.1 mmol/L (ref 3.5–5.3)
Sodium: 141 mmol/L (ref 135–146)
TOTAL PROTEIN: 7.1 g/dL (ref 6.1–8.1)

## 2016-08-08 LAB — CBC WITH DIFFERENTIAL/PLATELET
BASOS ABS: 26 {cells}/uL (ref 0–200)
Basophils Relative: 1 %
EOS ABS: 78 {cells}/uL (ref 15–500)
Eosinophils Relative: 3 %
HEMATOCRIT: 41.4 % (ref 38.5–50.0)
HEMOGLOBIN: 13.7 g/dL (ref 13.2–17.1)
LYMPHS ABS: 1300 {cells}/uL (ref 850–3900)
Lymphocytes Relative: 50 %
MCH: 31.2 pg (ref 27.0–33.0)
MCHC: 33.1 g/dL (ref 32.0–36.0)
MCV: 94.3 fL (ref 80.0–100.0)
MONO ABS: 312 {cells}/uL (ref 200–950)
MPV: 9.3 fL (ref 7.5–12.5)
Monocytes Relative: 12 %
NEUTROS ABS: 884 {cells}/uL — AB (ref 1500–7800)
NEUTROS PCT: 34 %
Platelets: 282 10*3/uL (ref 140–400)
RBC: 4.39 MIL/uL (ref 4.20–5.80)
RDW: 13.3 % (ref 11.0–15.0)
WBC: 2.6 10*3/uL — ABNORMAL LOW (ref 3.8–10.8)

## 2016-08-09 LAB — HIV-1 RNA QUANT-NO REFLEX-BLD
HIV 1 RNA Quant: 138925 copies/mL — ABNORMAL HIGH (ref <20)
HIV-1 RNA Quant, Log: 5.14 Log copies/mL — ABNORMAL HIGH (ref <1.30)

## 2016-08-09 LAB — T-HELPER CELL (CD4) - (RCID CLINIC ONLY)
CD4 % Helper T Cell: 25 % — ABNORMAL LOW (ref 33–55)
CD4 T Cell Abs: 320 /uL — ABNORMAL LOW (ref 400–2700)

## 2016-08-09 LAB — RPR

## 2016-08-13 ENCOUNTER — Other Ambulatory Visit: Payer: Self-pay | Admitting: Infectious Disease

## 2016-08-13 DIAGNOSIS — B20 Human immunodeficiency virus [HIV] disease: Secondary | ICD-10-CM

## 2016-08-13 NOTE — Progress Notes (Signed)
Can we add genotype to labs done on [facility])?

## 2016-08-14 NOTE — Progress Notes (Signed)
Excellent

## 2016-08-14 NOTE — Progress Notes (Signed)
I'll send the request to Santiago Glad, will follow up.

## 2016-08-23 LAB — HIV-1 GENOTYPR PLUS

## 2016-08-26 ENCOUNTER — Encounter: Payer: Self-pay | Admitting: Infectious Disease

## 2016-08-26 ENCOUNTER — Other Ambulatory Visit: Payer: Self-pay | Admitting: *Deleted

## 2016-08-26 ENCOUNTER — Ambulatory Visit (INDEPENDENT_AMBULATORY_CARE_PROVIDER_SITE_OTHER): Payer: Self-pay | Admitting: Infectious Disease

## 2016-08-26 VITALS — BP 114/70 | HR 71 | Temp 98.1°F | Ht 67.5 in | Wt 153.0 lb

## 2016-08-26 DIAGNOSIS — Z23 Encounter for immunization: Secondary | ICD-10-CM

## 2016-08-26 DIAGNOSIS — A539 Syphilis, unspecified: Secondary | ICD-10-CM

## 2016-08-26 DIAGNOSIS — Z789 Other specified health status: Secondary | ICD-10-CM

## 2016-08-26 DIAGNOSIS — Z599 Problem related to housing and economic circumstances, unspecified: Secondary | ICD-10-CM

## 2016-08-26 DIAGNOSIS — B20 Human immunodeficiency virus [HIV] disease: Secondary | ICD-10-CM

## 2016-08-26 DIAGNOSIS — F640 Transsexualism: Secondary | ICD-10-CM

## 2016-08-26 HISTORY — DX: Problem related to housing and economic circumstances, unspecified: Z59.9

## 2016-08-26 MED ORDER — DOLUTEGRAVIR SODIUM 50 MG PO TABS: 50.0000 mg | ORAL_TABLET | Freq: Every day | ORAL | 5 refills | Status: DC

## 2016-08-26 MED ORDER — EMTRICITABINE-TENOFOVIR AF 200-25 MG PO TABS: 1.0000 | ORAL_TABLET | Freq: Every day | ORAL | 5 refills | Status: DC

## 2016-08-26 MED ORDER — ESTRADIOL 2 MG PO TABS: 2.0000 mg | ORAL_TABLET | Freq: Every day | ORAL | 4 refills | Status: DC

## 2016-08-26 MED ORDER — SPIRONOLACTONE 25 MG PO TABS: 50.0000 mg | ORAL_TABLET | Freq: Every day | ORAL | 4 refills | Status: DC

## 2016-08-26 NOTE — Progress Notes (Signed)
Subjective:   Chief complaint: Multiple complaints including fact that coworkers are not treating her with respect to her targeting her due to her transgender identity, also dissatisfaction with effects of estradiol on feminization.    Patient ID: Travis Palmer, male    DOB: 24-Nov-1996, 20 y.o.   MRN: [id number]  HPI  Travis Palmer returns for followup for HIV care. She had been enrolled and 8 little trial group 5353 and was on DTG and lamivudine with nice virological control but who fell out of the study approximate 4 weeks before it ended. I had seen her this fall and we were going to prescribe her regimen but she then again fell out of care was homeless for some period of time. [name] now back working and is staying at a plate friend's place. 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  Travis Palmer and I discussed various antiretroviral regimens for her and she agreed to restart TIVICAY along with DESCOVY.  With regards to her transgender process will restart spironolactone along with estradiol.  She also had a history of recent gallstones for which she was hospitalized in [place] she would like to have a cholecystectomy performed. A told her that this will reveal to be done at a hospital such as [facility] or [facility] or somewhere where there is [place]  care for this. Hopefully eventually she can actually acquire insurance and this will solve some of these issues  Past Medical History:  Diagnosis Date  . History of bipolar disorder   . HIV infection ([place])   . Syphilis 08/02/2015    Past Surgical History:  Procedure Laterality Date  . WISDOM TOOTH EXTRACTION  2014    Family History  Problem Relation Age of Onset  . Lactose intolerance Mother   . Cancer Maternal Aunt   . Hypertension Maternal Grandmother       Social History   Social History  . Marital status: Single    Spouse name: N/A  . Number of children:  N/A  . Years of education: N/A   Social History Main Topics  . Smoking status: Current Some Day Smoker    Packs/day: 0.00    Types: Cigarettes  . Smokeless tobacco: Never Used  . Alcohol use 0.0 oz/week     Comment: weekend drinker  . Drug use:     Frequency: 7.0 times per week    Types: Marijuana     Comment: every other day  . Sexual activity: Not Currently     Comment: given condoms   Other Topics Concern  . None   Social History Narrative  . None    Allergies  Allergen Reactions  . Depakote [Divalproex Sodium] Other (See Comments)    Hospital for 3 days for extreme GI upset     Current Outpatient Prescriptions:  .  dolutegravir (TIVICAY) 50 MG tablet, Take 1 tablet (50 mg total) by mouth daily., Disp: 30 tablet, Rfl: 5 .  emtricitabine-tenofovir AF (DESCOVY) 200-25 MG tablet, Take 1 tablet by mouth daily., Disp: 30 tablet, Rfl: 5 .  estradiol (ESTRACE) 2 MG tablet, Take 1 tablet (2 mg total) by mouth daily., Disp: 30 tablet, Rfl: 4 .  spironolactone (ALDACTONE) 25 MG tablet, Take 2 tablets (50 mg total) by mouth daily., Disp: 60 tablet, Rfl: 4      Review of Systems  Constitutional: Negative for activity change, appetite change, chills, diaphoresis, fatigue, fever and  unexpected weight change.  HENT: Negative for congestion, rhinorrhea, sinus pressure, sneezing, sore throat and trouble swallowing.   Eyes: Negative for photophobia and visual disturbance.  Respiratory: Negative for cough, chest tightness, shortness of breath, wheezing and stridor.   Cardiovascular: Negative for chest pain, palpitations and leg swelling.  Gastrointestinal: Negative for abdominal distention, abdominal pain, anal bleeding, blood in stool, constipation, diarrhea, nausea and vomiting.  Genitourinary: Negative for difficulty urinating, dysuria, flank pain and hematuria.  Musculoskeletal: Negative for arthralgias, back pain, gait problem, joint swelling and myalgias.  Skin: Negative for  color change, pallor, rash and wound.  Neurological: Negative for dizziness, tremors, weakness and light-headedness.  Hematological: Negative for adenopathy. Does not bruise/bleed easily.  Psychiatric/Behavioral: Negative for agitation, behavioral problems, confusion, decreased concentration, dysphoric mood and sleep disturbance.       Objective:   Physical Exam  Constitutional: He is oriented to person, place, and time. He appears well-developed and well-nourished.  HENT:  Head: Normocephalic and atraumatic.  Eyes: Conjunctivae and EOM are normal.  Neck: Normal range of motion. Neck supple.  Cardiovascular: Normal rate and regular rhythm.   Pulmonary/Chest: Effort normal. No respiratory distress. He has no wheezes.  Abdominal: Soft. He exhibits no distension.  Musculoskeletal: Normal range of motion. He exhibits no edema or tenderness.  Neurological: He is alert and oriented to person, place, and time.  Skin: Skin is warm and dry. No rash noted. No erythema. No pallor.  Psychiatric: His speech is normal. His mood appears not anxious. He is not hyperactive. He does not exhibit a depressed mood.  Wearing sun glasses throughout interview  Nursing note reviewed.         Assessment & Plan:   HIV: check labs now and then restart Tivicay but now with Descovy and have her come back for followup labs in one month, visit with me in November. She will meet with Travis Palmer in the next 2 weeks  Transgender: restart aldactone and estradiol   Syphilis: sp mx treatments. Recheck labs and GC and chlamydia  Housing problems: Travis Palmer met with Travis Palmer to help with this and also with support for Transgender identity  I spent greater than 40  minutes with the patient including greater than 50% of time in face to face counsel of the patient re her HIV, syphilis, transgender identity and in coordination of their care.

## 2016-08-26 NOTE — Patient Instructions (Signed)
Make followup with Travis Palmer with Pharmacy  We need to enroll you into [facility] and ADAP

## 2016-09-12 ENCOUNTER — Other Ambulatory Visit: Payer: Self-pay | Admitting: *Deleted

## 2016-09-12 DIAGNOSIS — B20 Human immunodeficiency virus [HIV] disease: Secondary | ICD-10-CM

## 2016-09-12 MED ORDER — EMTRICITABINE-TENOFOVIR AF 200-25 MG PO TABS: 1.0000 | ORAL_TABLET | Freq: Every day | ORAL | 3 refills | Status: DC

## 2016-09-12 MED ORDER — DOLUTEGRAVIR SODIUM 50 MG PO TABS: 50.0000 mg | ORAL_TABLET | Freq: Every day | ORAL | 3 refills | Status: DC

## 2016-09-13 ENCOUNTER — Encounter: Payer: Self-pay | Admitting: Infectious Disease

## 2016-09-17 ENCOUNTER — Ambulatory Visit: Payer: Self-pay

## 2016-09-20 ENCOUNTER — Ambulatory Visit: Payer: Self-pay

## 2017-01-02 ENCOUNTER — Other Ambulatory Visit: Payer: Self-pay

## 2017-01-02 ENCOUNTER — Telehealth: Payer: Self-pay

## 2017-01-02 DIAGNOSIS — F640 Transsexualism: Secondary | ICD-10-CM

## 2017-01-02 DIAGNOSIS — Z789 Other specified health status: Secondary | ICD-10-CM

## 2017-01-02 DIAGNOSIS — B20 Human immunodeficiency virus [HIV] disease: Secondary | ICD-10-CM

## 2017-01-02 MED ORDER — DOLUTEGRAVIR SODIUM 50 MG PO TABS: 50.0000 mg | ORAL_TABLET | Freq: Every day | ORAL | 3 refills | Status: DC

## 2017-01-02 MED ORDER — ESTRADIOL 2 MG PO TABS: 2.0000 mg | ORAL_TABLET | Freq: Every day | ORAL | 4 refills | Status: DC

## 2017-01-02 MED ORDER — SPIRONOLACTONE 25 MG PO TABS: 50.0000 mg | ORAL_TABLET | Freq: Every day | ORAL | 4 refills | Status: DC

## 2017-01-02 NOTE — Telephone Encounter (Signed)
Duplicate-opened in error. 

## 2017-01-02 NOTE — Telephone Encounter (Signed)
Caryl Pina from [facility] [address] called stating that [name] was in the pharmacy at the moment of the call and was being very loud and belligerent demanding her Rx be refilled. When the pharmacist asked what refills needed to be refilled Pt told her to figured it out and to make sure her hormones got refilled. I gave a verbal order for Pt's tivicay and descovy. Pt's estrace and aldactone will have to be sent to provider to advise.

## 2017-02-03 ENCOUNTER — Other Ambulatory Visit: Payer: Self-pay | Admitting: *Deleted

## 2017-02-04 ENCOUNTER — Ambulatory Visit: Payer: Self-pay

## 2017-02-04 ENCOUNTER — Other Ambulatory Visit (INDEPENDENT_AMBULATORY_CARE_PROVIDER_SITE_OTHER): Payer: Self-pay

## 2017-02-04 DIAGNOSIS — Z23 Encounter for immunization: Secondary | ICD-10-CM

## 2017-02-04 DIAGNOSIS — B20 Human immunodeficiency virus [HIV] disease: Secondary | ICD-10-CM

## 2017-02-04 LAB — COMPLETE METABOLIC PANEL WITH GFR
ALBUMIN: 4.1 g/dL (ref 3.6–5.1)
ALK PHOS: 49 U/L (ref 40–115)
ALT: 15 U/L (ref 9–46)
AST: 17 U/L (ref 10–40)
BUN: 9 mg/dL (ref 7–25)
CHLORIDE: 105 mmol/L (ref 98–110)
CO2: 30 mmol/L (ref 20–31)
Calcium: 9.1 mg/dL (ref 8.6–10.3)
Creat: 0.8 mg/dL (ref 0.60–1.35)
GFR, Est African American: >89 mL/min (ref >=60)
GLUCOSE: 77 mg/dL (ref 65–99)
POTASSIUM: 4.1 mmol/L (ref 3.5–5.3)
SODIUM: 141 mmol/L (ref 135–146)
Total Bilirubin: 0.5 mg/dL (ref 0.2–1.2)
Total Protein: 7.5 g/dL (ref 6.1–8.1)

## 2017-02-04 LAB — CBC WITH DIFFERENTIAL/PLATELET
BASOS ABS: 0 {cells}/uL (ref 0–200)
Basophils Relative: 0 %
EOS PCT: 2 %
Eosinophils Absolute: 52 cells/uL (ref 15–500)
HEMATOCRIT: 41 % (ref 38.5–50.0)
HEMOGLOBIN: 13.5 g/dL (ref 13.2–17.1)
LYMPHS ABS: 1040 {cells}/uL (ref 850–3900)
Lymphocytes Relative: 40 %
MCH: 31.4 pg (ref 27.0–33.0)
MCHC: 32.9 g/dL (ref 32.0–36.0)
MCV: 95.3 fL (ref 80.0–100.0)
MONO ABS: 338 {cells}/uL (ref 200–950)
MPV: 9.6 fL (ref 7.5–12.5)
Monocytes Relative: 13 %
NEUTROS ABS: 1170 {cells}/uL — AB (ref 1500–7800)
Neutrophils Relative %: 45 %
Platelets: 195 10*3/uL (ref 140–400)
RBC: 4.3 MIL/uL (ref 4.20–5.80)
RDW: 13.1 % (ref 11.0–15.0)
WBC: 2.6 10*3/uL — ABNORMAL LOW (ref 3.8–10.8)

## 2017-02-06 LAB — T-HELPER CELL (CD4) - (RCID CLINIC ONLY)
CD4 % Helper T Cell: 26 % — ABNORMAL LOW (ref 33–55)
CD4 T CELL ABS: 300 /uL — AB (ref 400–2700)

## 2017-02-08 LAB — HIV RNA, RTPCR W/R GT (RTI, PI,INT)
HIV-1 RNA, QN PCR: 144000 copies/mL — ABNORMAL HIGH
HIV-1 RNA, QN PCR: 5.16 Log copies/mL — ABNORMAL HIGH

## 2017-02-13 LAB — RFLX HIV-1 INTEGRASE GENOTYPE

## 2017-02-13 LAB — HIV-1 GENOTYPE: HIV-1 GENOTYPE: DETECTED — AB

## 2017-03-05 ENCOUNTER — Encounter: Payer: Self-pay | Admitting: Infectious Disease

## 2017-03-25 ENCOUNTER — Ambulatory Visit (INDEPENDENT_AMBULATORY_CARE_PROVIDER_SITE_OTHER): Payer: Self-pay | Admitting: Infectious Disease

## 2017-03-25 ENCOUNTER — Encounter: Payer: Self-pay | Admitting: Infectious Disease

## 2017-03-25 VITALS — BP 129/78 | HR 74 | Temp 98.6°F | Wt 159.8 lb

## 2017-03-25 DIAGNOSIS — Z789 Other specified health status: Secondary | ICD-10-CM

## 2017-03-25 DIAGNOSIS — B20 Human immunodeficiency virus [HIV] disease: Secondary | ICD-10-CM

## 2017-03-25 DIAGNOSIS — F640 Transsexualism: Secondary | ICD-10-CM

## 2017-03-25 DIAGNOSIS — F17210 Nicotine dependence, cigarettes, uncomplicated: Secondary | ICD-10-CM

## 2017-03-25 DIAGNOSIS — A539 Syphilis, unspecified: Secondary | ICD-10-CM

## 2017-03-25 NOTE — Progress Notes (Signed)
HPI: Travis Palmer is a 21 y.o. transgender M2F with poorly HIV not currently on ART.  Allergies: Allergies  Allergen Reactions  . Depakote [Divalproex Sodium] Other (See Comments)    Hospital for 3 days for extreme GI upset    Vitals: Temp: 98.6 F (37 C) (04/17 1606) BP: 129/78 (04/17 1606) Pulse Rate: 74 (04/17 1606)  Past Medical History: Past Medical History:  Diagnosis Date  . History of bipolar disorder   . HIV infection ([place])   . Housing problems 08/26/2016  . Syphilis 08/02/2015    Social History: Social History   Social History  . Marital status: Single    Spouse name: N/A  . Number of children: N/A  . Years of education: N/A   Social History Main Topics  . Smoking status: Current Some Day Smoker    Packs/day: 0.00    Types: Cigarettes  . Smokeless tobacco: Never Used  . Alcohol use 0.0 oz/week     Comment: weekend drinker  . Drug use: Yes    Frequency: 7.0 times per week    Types: Marijuana     Comment: every other day  . Sexual activity: Not Currently     Comment: given condoms   Other Topics Concern  . None   Social History Narrative  . None    Current Regimen: Tivicay/Descovy  Labs: HIV 1 RNA Quant (copies/mL)  Date Value  08/08/2016 138,925 (H)  12/07/2014 140,743 (H)   HIV-1 RNA Viral Load (no units)  Date Value  11/06/2015 50  09/26/2015 175  07/19/2015 <40   CD4 (no units)  Date Value  07/19/2015 524  04/26/2015 427  03/07/2015 479   CD4 T Cell Abs (/uL)  Date Value  02/04/2017 300 (L)  08/08/2016 320 (L)   Hep B S Ab (no units)  Date Value  12/07/2014 NEG   Hepatitis B Surface Ag (no units)  Date Value  01/09/2015 NEGATIVE   HCV Ab (no units)  Date Value  01/31/2015 NEGATIVE    CrCl: CrCl cannot be calculated (Patient's most recent lab result is older than the maximum 21 days allowed.).  Lipids:    Component Value Date/Time   CHOL 92 01/31/2015 1653   TRIG 90 01/31/2015 1653   HDL 28 (L) 01/31/2015  1653   CHOLHDL 3.3 01/31/2015 1653   VLDL 18 01/31/2015 1653   LDLCALC 46 01/31/2015 1653    Assessment: 'Travis Palmer' is a 21 yo transgender M2F patient not currently on any ART. Her last CD4 300 and VL of 144k 02/04/17. She was initially enrolled in our 8 little trial but discontinued the trial due to social circumstances. Travis Palmer has decided to restart ART with help from [facility] and Dr. Tommy Medal. For simplification we will start her on biktarvy.  Travis Palmer is also on estradiol 2mg  and spironolactone 50mg  daily. [name] requesting that we increase her estradiol dose. Since she has not restarted her ART, then we will have her return in 5 to 6 weeks for labs. If her HIV labs show improvement, and she reports adherence, then we will increase her estradiol to 4mg  daily.  We counseled her on the risks associated with estrogen therapy, particularly VTE and the need to seek immediate support for signs and symptoms (SOB, leg swelling, etc.)  Recommendations: Start biktarvy one tablet daily  Do not miss any doses Return to clinic in 5 to 6 weeks for labs and estradiol dose change  Dierdre Harness, BS, PharmD Clinical Pharmacy Resident [phone]  Pager) 03/25/2017 4:43 PM

## 2017-03-25 NOTE — Progress Notes (Signed)
Subjective:   Chief complaint: She would like to get back on her antiretrovirals and claims that she has been trying to be seen for several months. She also asked about escalation of her estradiol dose.  Patient ID: Travis Palmer, male    DOB: 1995-12-14, 21 y.o.   MRN: [id number]  HPI  Travis Palmer returns for followup for HIV care. She had been enrolled and [facility]  trial group 5353 and was on DTG and lamivudine with nice virological control but who fell out of the study approximate 4 weeks before it ended  We started her on TIVICAY and DESCOVY and in fact I had introduced her to Cassie previously. I again proposed her meeting Cassie and being engaged with pharmacy today. Cassie had left for the day so we had her meet with men and [place] John.  I have counseled Travis Palmer extensively that she needs to be followed very closely both by myself but also by infectious disease pharmacy. We'll change her over to [facility] today since she has no evidence of integrase inhibitor resistance or other resistance on recent genotyping.  Past Medical History:  Diagnosis Date  . History of bipolar disorder   . HIV infection ([place])   . Housing problems 08/26/2016  . Syphilis 08/02/2015    Past Surgical History:  Procedure Laterality Date  . WISDOM TOOTH EXTRACTION  2014    Family History  Problem Relation Age of Onset  . Lactose intolerance Mother   . Cancer Maternal Aunt   . Hypertension Maternal Grandmother       Social History   Social History  . Marital status: Single    Spouse name: N/A  . Number of children: N/A  . Years of education: N/A   Social History Main Topics  . Smoking status: Current Some Day Smoker    Packs/day: 0.00    Types: Cigarettes  . Smokeless tobacco: Never Used  . Alcohol use 0.0 oz/week     Comment: weekend drinker  . Drug use: Yes    Frequency: 7.0 times per week    Types: Marijuana     Comment: every other day  . Sexual activity: Not Currently     Comment: given  condoms   Other Topics Concern  . Not on file   Social History Narrative  . No narrative on file    Allergies  Allergen Reactions  . Depakote [Divalproex Sodium] Other (See Comments)    Hospital for 3 days for extreme GI upset     Current Outpatient Prescriptions:  .  dolutegravir (TIVICAY) 50 MG tablet, Take 1 tablet (50 mg total) by mouth daily., Disp: 30 tablet, Rfl: 3 .  emtricitabine-tenofovir AF (DESCOVY) 200-25 MG tablet, Take 1 tablet by mouth daily., Disp: 30 tablet, Rfl: 3 .  estradiol (ESTRACE) 2 MG tablet, Take 1 tablet (2 mg total) by mouth daily., Disp: 30 tablet, Rfl: 4 .  spironolactone (ALDACTONE) 25 MG tablet, Take 2 tablets (50 mg total) by mouth daily., Disp: 60 tablet, Rfl: 4      Review of Systems  Constitutional: Negative for activity change, appetite change, chills, diaphoresis, fatigue, fever and unexpected weight change.  HENT: Negative for congestion, rhinorrhea, sinus pressure, sneezing, sore throat and trouble swallowing.   Eyes: Negative for photophobia and visual disturbance.  Respiratory: Negative for cough, chest tightness, shortness of breath, wheezing and stridor.   Cardiovascular: Negative for chest pain, palpitations and leg swelling.  Gastrointestinal: Negative for abdominal distention, abdominal pain, anal bleeding, blood  in stool, constipation, diarrhea, nausea and vomiting.  Genitourinary: Negative for difficulty urinating, dysuria, flank pain and hematuria.  Musculoskeletal: Negative for arthralgias, back pain, gait problem, joint swelling and myalgias.  Skin: Negative for color change, pallor, rash and wound.  Neurological: Negative for dizziness, tremors, weakness and light-headedness.  Hematological: Negative for adenopathy. Does not bruise/bleed easily.  Psychiatric/Behavioral: Negative for agitation, behavioral problems, confusion, decreased concentration, dysphoric mood and sleep disturbance.       Objective:   Physical Exam    Constitutional: He is oriented to person, place, and time. He appears well-developed and well-nourished.  HENT:  Head: Normocephalic and atraumatic.  Eyes: Conjunctivae and EOM are normal.  Neck: Normal range of motion. Neck supple.  Cardiovascular: Normal rate and regular rhythm.   Pulmonary/Chest: Effort normal. No respiratory distress. He has no wheezes.  Abdominal: Soft. He exhibits no distension.  Musculoskeletal: Normal range of motion. He exhibits no edema or tenderness.  Neurological: He is alert and oriented to person, place, and time.  Skin: Skin is warm and dry. No rash noted. No erythema. No pallor.  Psychiatric: His speech is normal. His mood appears not anxious. He is not hyperactive. He does not exhibit a depressed mood.  The most, and poised but I can remember remember seeing her ever.  Nursing note reviewed.         Assessment & Plan:   HIV: Start BIKTARVY and see Cassie in next 2 weeks and get labs  NOTE IN FUTURE MAKE SURE THAT AMBRE IS ALWAYS ALSO BEING FOLLOWED BY ID PHARMACY AND CAN BE SCHEDULED WITH THEM IN ADDITION TO WITH ME   Transgender: Continue aldactone and estradiol. Counseled regarding risks of prothrombotic effects of this drug. Can consider escalation in the future.   Syphilis: sp mx treatments. .NON REAC (08/31 1158)  I spent greater than 25  minutes with the patient including greater than 50% of time in face to face counsel of the patient re her HIV, syphilis, transgender identity and in coordination of her care.

## 2017-03-26 ENCOUNTER — Other Ambulatory Visit: Payer: Self-pay | Admitting: *Deleted

## 2017-03-26 ENCOUNTER — Encounter: Payer: Self-pay | Admitting: Infectious Disease

## 2017-03-26 DIAGNOSIS — F640 Transsexualism: Secondary | ICD-10-CM

## 2017-03-26 DIAGNOSIS — Z789 Other specified health status: Secondary | ICD-10-CM

## 2017-03-26 DIAGNOSIS — B20 Human immunodeficiency virus [HIV] disease: Secondary | ICD-10-CM

## 2017-03-26 MED ORDER — BICTEGRAVIR-EMTRICITAB-TENOFOV 50-200-25 MG PO TABS: 1.0000 | ORAL_TABLET | Freq: Every day | ORAL | 5 refills | Status: DC

## 2017-03-26 MED ORDER — ESTRADIOL 2 MG PO TABS: 2.0000 mg | ORAL_TABLET | Freq: Every day | ORAL | 0 refills | Status: DC

## 2017-03-26 MED ORDER — SPIRONOLACTONE 25 MG PO TABS: 50.0000 mg | ORAL_TABLET | Freq: Every day | ORAL | 0 refills | Status: DC

## 2017-03-28 ENCOUNTER — Other Ambulatory Visit: Payer: Self-pay | Admitting: *Deleted

## 2017-04-25 ENCOUNTER — Other Ambulatory Visit: Payer: Self-pay | Admitting: Infectious Disease

## 2017-04-25 DIAGNOSIS — F640 Transsexualism: Secondary | ICD-10-CM

## 2017-04-25 DIAGNOSIS — Z789 Other specified health status: Secondary | ICD-10-CM

## 2017-04-29 ENCOUNTER — Ambulatory Visit (INDEPENDENT_AMBULATORY_CARE_PROVIDER_SITE_OTHER): Payer: Self-pay | Admitting: Pharmacist

## 2017-04-29 ENCOUNTER — Other Ambulatory Visit: Payer: Self-pay

## 2017-04-29 ENCOUNTER — Telehealth: Payer: Self-pay | Admitting: *Deleted

## 2017-04-29 DIAGNOSIS — B20 Human immunodeficiency virus [HIV] disease: Secondary | ICD-10-CM

## 2017-04-29 DIAGNOSIS — Z789 Other specified health status: Secondary | ICD-10-CM

## 2017-04-29 DIAGNOSIS — F640 Transsexualism: Secondary | ICD-10-CM

## 2017-04-29 MED ORDER — SPIRONOLACTONE 100 MG PO TABS: 100.0000 mg | ORAL_TABLET | Freq: Every day | ORAL | 3 refills | Status: DC

## 2017-04-29 MED ORDER — ESTRADIOL 2 MG PO TABS: 4.0000 mg | ORAL_TABLET | Freq: Every day | ORAL | 3 refills | Status: DC

## 2017-04-29 MED ORDER — BICTEGRAVIR-EMTRICITAB-TENOFOV 50-200-25 MG PO TABS: 1.0000 | ORAL_TABLET | Freq: Every day | ORAL | 5 refills | Status: DC

## 2017-04-29 NOTE — Telephone Encounter (Signed)
Patient requesting refills of estradiol/sprionolactone.  Patient was given 1 month supply in April with condition to follow up with pharmacy today.  Patient no-showed pharmacy appointment. Please advise on hormone therapy refill.  [name], RN

## 2017-04-29 NOTE — Telephone Encounter (Signed)
Patient received reminder call with incorrect time (3:45 not 2:00).  She came to see pharmacy, Cassie adjusted her hormones. [name], RN

## 2017-04-29 NOTE — Progress Notes (Signed)
HPI: Travis Palmer is a 21 y.o. transgender male who presents to the [place] clinic for HIV and hormone replacement follow-up.   Allergies: Allergies  Allergen Reactions  . Depakote [Divalproex Sodium] Other (See Comments)    Hospital for 3 days for extreme GI upset    Past Medical History: Past Medical History:  Diagnosis Date  . History of bipolar disorder   . HIV infection ([place])   . Housing problems 08/26/2016  . Syphilis 08/02/2015    Social History: Social History   Social History  . Marital status: Single    Spouse name: N/A  . Number of children: N/A  . Years of education: N/A   Social History Main Topics  . Smoking status: Current Some Day Smoker    Packs/day: 0.00    Types: Cigarettes  . Smokeless tobacco: Never Used  . Alcohol use 0.0 oz/week     Comment: weekend drinker  . Drug use: Yes    Frequency: 7.0 times per week    Types: Marijuana     Comment: every other day  . Sexual activity: Not Currently     Comment: given condoms   Other Topics Concern  . Not on file   Social History Narrative  . No narrative on file    Current Regimen: Biktarvy  Labs: HIV 1 RNA Quant (copies/mL)  Date Value  08/08/2016 138,925 (H)  12/07/2014 140,743 (H)   HIV-1 RNA Viral Load (no units)  Date Value  11/06/2015 50  09/26/2015 175  07/19/2015 <40   CD4 (no units)  Date Value  07/19/2015 524  04/26/2015 427  03/07/2015 479   CD4 T Cell Abs (/uL)  Date Value  02/04/2017 300 (L)  08/08/2016 320 (L)   Hep B S Ab (no units)  Date Value  12/07/2014 NEG   Hepatitis B Surface Ag (no units)  Date Value  01/09/2015 NEGATIVE   HCV Ab (no units)  Date Value  01/31/2015 NEGATIVE    CrCl: CrCl cannot be calculated (Patient's most recent lab result is older than the maximum 21 days allowed.).  Lipids:    Component Value Date/Time   CHOL 92 01/31/2015 1653   TRIG 90 01/31/2015 1653   HDL 28 (L) 01/31/2015 1653   CHOLHDL 3.3 01/31/2015 1653   VLDL 18 01/31/2015 1653   LDLCALC 46 01/31/2015 1653    Assessment: Travis Palmer is here today for HIV follow-up.  She was recently started on Biktarvy on 4/17 after being off of medications for a few.  She says today that she loves Biktarvy and it is treating her well.  She states she has not missed any doses since starting ~1 month ago.  She does admit to not taking it around the same time every day.  Counseled her on the importance of trying to take it around the same time, but told her I would rather her take it later in the day than miss a day all together.  She does notice that her nipples are more sensitive.  She also notices that her boobs are growing and the hormones seem to be working.  [name] also asking about STD testing.  She states no one has ever tested her for anal G/C and she does partake in anal sex quite often. She does complain of a sore throat today.  Will do oral and anal G/C screening, as well as RPR with labs today.  She will come back and see me in 3 months.  [name] also asking if Dr. Tommy Medal will write her a note to get excused from school for 2-3 months.  [name] training for a new job and cannot do that and school.  She states for 7 weeks she has to train weird hours and after that she will go part time and do school.  Wants Dr. Tommy Medal to write her an excuse.  I told her I would relay that message to him.   Plans: - Continue Biktarvy 1 pill PO once daily - Increase estradiol to 4 mg PO once daily - Increase spironolactone to 100 mg PO once daily - HIV VL, CD4, RPR, G/C oral and anal  - F/u with me in 3 months on 9/12 at [place]. Kuppelweiser, PharmD, [place] for Infectious Disease 04/29/2017, 4:33 PM

## 2017-04-29 NOTE — Telephone Encounter (Signed)
Ok very good.

## 2017-04-30 LAB — RPR

## 2017-05-01 LAB — CYTOLOGY, (ORAL, ANAL, URETHRAL) ANCILLARY ONLY
CHLAMYDIA, DNA PROBE: NEGATIVE
CHLAMYDIA, DNA PROBE: POSITIVE — AB
NEISSERIA GONORRHEA: NEGATIVE
Neisseria Gonorrhea: POSITIVE — AB

## 2017-05-01 LAB — T-HELPER CELL (CD4) - (RCID CLINIC ONLY)
CD4 % Helper T Cell: 27 % — ABNORMAL LOW (ref 33–55)
CD4 T CELL ABS: 520 /uL (ref 400–2700)

## 2017-05-02 ENCOUNTER — Telehealth: Payer: Self-pay | Admitting: *Deleted

## 2017-05-02 ENCOUNTER — Telehealth: Payer: Self-pay | Admitting: Pharmacist

## 2017-05-02 LAB — HIV-1 RNA QUANT-NO REFLEX-BLD
HIV 1 RNA Quant: 173 copies/mL — ABNORMAL HIGH
HIV-1 RNA Quant, Log: 2.24 Log copies/mL — ABNORMAL HIGH

## 2017-05-02 NOTE — Telephone Encounter (Signed)
Travis Palmer needs to be treated for [facility] and chlamydia.  She does not apparently want to be called while at work.  I understand she is upset about being called during work hours.  I have noted the above complaints. We will seek to come to a mutually acceptable agreement.

## 2017-05-02 NOTE — Telephone Encounter (Signed)
This is an issue for Shanon Brow at clinic Freight forwarder level.  I find her behavior HIGHLY inappropriate and counter productive

## 2017-05-02 NOTE — Telephone Encounter (Signed)
Patient call transferred from front staff, Travis Palmer. Patient irate stating she is always treated badly by the front staff and she wants to transfer to another MD. She did not say where. I tried to diffuse the situation, apologizing to the patient, "stating I'm sorry that you had a bad experience". Offered to transfer her to our manager and that seemed to enrage her further. Offered to have Shanon Brow, our office manager come back to triage and she could share her experience now; as she stated she was on break from work and could not receive calls while working. This only seemed to make her madder; because I could not fix her issue right now. I'm not sure what the issue is, except that she received two calls from [facility] about lab results and needing possible treatment. There are no phone notes so I do not know who called the patient. I spoke to [place] and she stated that she tried to help the patient and she would not even let her speak. Shanon Brow, office manager has been made aware of this phone call.

## 2017-05-02 NOTE — Telephone Encounter (Signed)
I was the one that called Travis Palmer twice this morning.  I called to let her know she needed to be treated for oral chlamydia and rectal gonorrhea.  There was no answer both times I called and I did NOT leave any voicemail either time I called. I actually could not leave a voicemail because her mailbox was full.  I'm not sure why she is so upset, but there was no information left and I wasn't aware not to call her at work.  She does need treatment ASAP.

## 2017-05-06 ENCOUNTER — Telehealth: Payer: Self-pay

## 2017-05-06 NOTE — Telephone Encounter (Signed)
Travis Palmer, clinical supervisor is contacting patient regarding treatment.

## 2017-05-06 NOTE — Telephone Encounter (Signed)
Patient informed of positive gonorrhea and chlamydia test.  She will come on Wednesday morning for treatment.   I discussed conversation from last week regarding multiple calls made to his phone without leaving a message. When he returned call to our office there was confusion about who called him and he was anxious due to the number of missed calls. When he spoke with Kennyth Lose she was not able to determine who called and he was trying to explain his concern and felt she was not listening and was very non chalant about his concerns. Per patient she insisted she transfer call to practice manger and he did not feel it was necessary. All she wanted was to know why there were multiple calls without messages from our office.  She expressed concerns about problems in the past with getting thorough on the phone and having to wait for 30 minutes  for someone to pick up the phone to then be transferred to nursing voice mail.  I apologized  for the misunderstanding and explained the situation with the phone has been resolved.  She was informed if this happens in the future to feel free to reach out to me if needed or ask to speak with a different triage person.   Laverle Patter, RN

## 2017-05-06 NOTE — Telephone Encounter (Signed)
Please do not call patient back; Travis Palmer, clinical supervisor is notifying patient of needed treatment for positive STD. Travis Palmer

## 2017-05-06 NOTE — Telephone Encounter (Signed)
Just another Travis Palmer - she left a voice mail on the pharmacy phone Friday and said to please leave a voicemail next time we call.  So still not sure why she is so upset. Is someone going to try and get her treated? Should I call her back?

## 2017-05-06 NOTE — Telephone Encounter (Signed)
Note routed to Shanon Brow, Glass blower/designer.

## 2017-05-06 NOTE — Telephone Encounter (Signed)
Thanks Kennyth Lose and Tammy

## 2017-05-07 ENCOUNTER — Ambulatory Visit (INDEPENDENT_AMBULATORY_CARE_PROVIDER_SITE_OTHER): Payer: Self-pay | Admitting: *Deleted

## 2017-05-07 ENCOUNTER — Other Ambulatory Visit: Payer: Self-pay | Admitting: Pharmacist

## 2017-05-07 ENCOUNTER — Other Ambulatory Visit: Payer: Self-pay | Admitting: Pharmacist Clinician (PhC)/ Clinical Pharmacy Specialist

## 2017-05-07 DIAGNOSIS — B20 Human immunodeficiency virus [HIV] disease: Secondary | ICD-10-CM

## 2017-05-07 DIAGNOSIS — Z113 Encounter for screening for infections with a predominantly sexual mode of transmission: Secondary | ICD-10-CM

## 2017-05-07 DIAGNOSIS — A749 Chlamydial infection, unspecified: Secondary | ICD-10-CM

## 2017-05-07 DIAGNOSIS — A549 Gonococcal infection, unspecified: Secondary | ICD-10-CM

## 2017-05-07 MED ORDER — AZITHROMYCIN 250 MG PO TABS: 1000.0000 mg | ORAL_TABLET | Freq: Once | ORAL | Status: DC

## 2017-05-07 MED ORDER — CEFTRIAXONE SODIUM 250 MG IJ SOLR
250.0000 mg | Freq: Once | INTRAMUSCULAR | Status: DC
Start: 2017-05-07 — End: 2017-05-07

## 2017-05-07 MED ORDER — AZITHROMYCIN 250 MG PO TABS: 1000.0000 mg | ORAL_TABLET | Freq: Once | ORAL | 0 refills | Status: AC

## 2017-05-07 MED ORDER — AZITHROMYCIN 250 MG PO TABS
1000.0000 mg | ORAL_TABLET | Freq: Once | ORAL | Status: DC
  Administered 2017-05-07: 1000 mg via ORAL

## 2017-05-07 MED ORDER — CEFTRIAXONE SODIUM 250 MG IJ SOLR
250.0000 mg | Freq: Once | INTRAMUSCULAR | Status: DC
  Administered 2017-05-07: 250 mg via INTRAMUSCULAR

## 2017-05-07 MED ORDER — CEFTRIAXONE SODIUM 250 MG IJ SOLR: 250.0000 mg | Freq: Once | INTRAMUSCULAR | 0 refills | Status: AC

## 2017-05-07 NOTE — Patient Instructions (Signed)
Advised not to have unprotected sex.  Medications may take 7 days to clear infections.  Advised that she should always use protection to due to HIV diagnosis.  Advised that the local health department may call her about sexual contacts.

## 2017-05-07 NOTE — Addendum Note (Signed)
Addended by: Dinah Beers on: 05/07/2017 11:48 AM   Modules accepted: Orders

## 2017-05-07 NOTE — Progress Notes (Signed)
Patient given condoms and lubricant.  Advised not to have unprotected sex.  Medications may take 7 days to clear infections.  Advised that she should always use protection to due to HIV diagnosis.  Advised that the local health department may call her about sexual contacts.

## 2017-05-08 NOTE — Telephone Encounter (Signed)
Patient informed and scheduled for treatment.

## 2017-05-20 ENCOUNTER — Emergency Department (HOSPITAL_COMMUNITY)
Admission: EM | Admit: 2017-05-20 | Discharge: 2017-05-20 | Disposition: A | Discharge status: Home / Self Care | Payer: Self-pay | Attending: Emergency Medicine | Admitting: Emergency Medicine

## 2017-05-20 ENCOUNTER — Encounter (HOSPITAL_COMMUNITY): Payer: Self-pay | Admitting: *Deleted

## 2017-05-20 DIAGNOSIS — Z79899 Other long term (current) drug therapy: Secondary | ICD-10-CM | POA: Insufficient documentation

## 2017-05-20 DIAGNOSIS — M545 Low back pain: Secondary | ICD-10-CM

## 2017-05-20 DIAGNOSIS — F17210 Nicotine dependence, cigarettes, uncomplicated: Secondary | ICD-10-CM | POA: Insufficient documentation

## 2017-05-20 DIAGNOSIS — M4186 Other forms of scoliosis, lumbar region: Secondary | ICD-10-CM | POA: Insufficient documentation

## 2017-05-20 DIAGNOSIS — M419 Scoliosis, unspecified: Secondary | ICD-10-CM

## 2017-05-20 DIAGNOSIS — G8929 Other chronic pain: Secondary | ICD-10-CM | POA: Insufficient documentation

## 2017-05-20 LAB — URINALYSIS, ROUTINE W REFLEX MICROSCOPIC
BILIRUBIN URINE: NEGATIVE
GLUCOSE, UA: NEGATIVE mg/dL
Hgb urine dipstick: NEGATIVE
Ketones, ur: 5 mg/dL — AB
Leukocytes, UA: NEGATIVE
Nitrite: NEGATIVE
Protein, ur: NEGATIVE mg/dL
SPECIFIC GRAVITY, URINE: 1.033 — AB (ref 1.005–1.030)
pH: 5 (ref 5.0–8.0)

## 2017-05-20 MED ORDER — BACLOFEN 10 MG PO TABS: 10.0000 mg | ORAL_TABLET | Freq: Three times a day (TID) | ORAL | 0 refills | Status: DC

## 2017-05-20 NOTE — Discharge Instructions (Signed)
SEEK IMMEDIATE MEDICAL ATTENTION IF: New numbness, tingling, weakness, or problem with the use of your arms or legs.  Severe back pain not relieved with medications.  Change in bowel or bladder control.  Increasing pain in any areas of the body (such as chest or abdominal pain).  Shortness of breath, dizziness or fainting.  Nausea (feeling sick to your stomach), vomiting, fever, or sweats.  

## 2017-05-20 NOTE — ED Triage Notes (Signed)
Pt c/o low back pain since Sunday. Reports standing for long periods of time, denies direct injury to back. Has tried tylenol and benadryl without improvement

## 2017-05-20 NOTE — ED Provider Notes (Signed)
[place] DEPT Provider Note   CSN: [id number] Arrival date & time: 05/20/17  0433     History   Chief Complaint Chief Complaint  Patient presents with  . Back Pain    HPI Travis Palmer is a 21 y.o. Transgender Male (birth sex is male)  with a past medical history of HIV, syphilis and chronic back pain who presents wit a cc of back pain. The patient has a history of similar and stands for long preiods of time at work. The patient describes the pain as tight, stiff and uncomfortable. Skin positions make it worse. Denies weakness, loss of bowel/bladder function or saddle anesthesia. Denies neck stiffness, headache, rash.  Denies fever or recent procedures to back. Compliant with medication.   HPI  Past Medical History:  Diagnosis Date  . History of bipolar disorder   . HIV infection ([place])   . Housing problems 08/26/2016  . Syphilis 08/02/2015    Patient Active Problem List   Diagnosis Date Noted  . 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 Chlamydia 05/07/2017  . Housing problems 08/26/2016  . Syphilis 08/02/2015  . HIV disease ([place]) 01/09/2015  . 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 Cigarette smoker 01/09/2015    Past Surgical History:  Procedure Laterality Date  . [place] EXTRACTION  2014       Home Medications    Prior to Admission medications   Medication Sig Start Date End Date Taking? Authorizing Provider  baclofen (LIORESAL) 10 MG tablet Take 1 tablet (10 mg total) by mouth 3 (three) times daily. 05/20/17   Alainah Phang, PA-C  bictegravir-emtricitabine-tenofovir AF (BIKTARVY) 50-200-25 MG TABS tablet Take 1 tablet by mouth daily. 04/29/17   Kuppelweiser, Cassie L, RPH  estradiol (ESTRACE) 2 MG tablet Take 2 tablets (4 mg total) by mouth daily. 04/29/17   Kuppelweiser, Cassie L, RPH  spironolactone (ALDACTONE) 100 MG tablet Take 1 tablet (100 mg total) by mouth daily. 04/29/17   Kuppelweiser, Gillian Shields, RPH    Family History Family History  Problem  Relation Age of Onset  . Lactose intolerance Mother   . Cancer Maternal Aunt   . Hypertension Maternal Grandmother     Social History Social History  Substance Use Topics  . Smoking status: Current Some Day Smoker    Packs/day: 0.00    Types: Cigarettes  . Smokeless tobacco: Never Used  . Alcohol use 0.0 oz/week     Comment: weekend drinker     Allergies   Depakote [divalproex sodium]   Review of Systems Review of Systems  Ten systems reviewed and are negative for acute change, except as noted in the HPI.   Physical Exam Updated Vital Signs BP 124/78 (BP Location: Left Arm)   Pulse 82   Temp 98.1 F (36.7 C) (Oral)   Resp 15   Ht 5\' 8"  (1.727 m)   Wt 74.8 kg (165 lb)   SpO2 99%   BMI 25.09 kg/m   Physical Exam  Constitutional: He appears well-developed and well-nourished. No distress.  HENT:  Head: Normocephalic and atraumatic.  Eyes: Conjunctivae are normal. No scleral icterus.  Neck: Normal range of motion. Neck supple.  Cardiovascular: Normal rate, regular rhythm and normal heart sounds.   Pulmonary/Chest: Effort normal and breath sounds normal. No respiratory distress.  Abdominal: Soft. There is no tenderness.  Musculoskeletal: He exhibits no edema.  Palpable levoscoliosis with rotation in the lumbar spine. Tightness is worse on the right side with tenderness in the bilateral lower lumbar paraspinals. Full  range of motion,   Neurological: He is alert.  Skin: Skin is warm and dry. He is not diaphoretic.  Psychiatric: His behavior is normal.  Nursing note and vitals reviewed.    ED Treatments / Results  Labs (all labs ordered are listed, but only abnormal results are displayed) Labs Reviewed  URINALYSIS, ROUTINE W REFLEX MICROSCOPIC - Abnormal; Notable for the following:       Result Value   Specific Gravity, Urine 1.033 (*)    Ketones, ur 5 (*)    All other components within normal limits    EKG  EKG Interpretation None        Radiology No results found.  Procedures Procedures (including critical care time)  Medications Ordered in ED Medications - No data to display   Initial Impression / Assessment and Plan / ED Course  I have reviewed the triage vital signs and the nursing notes.  Pertinent labs & imaging results that were available during my care of the patient were reviewed by me and considered in my medical decision making (see chart for details).     Patient with back pain.  No neurological deficits and normal neuro exam.  Patient can walk but states is painful.  No loss of bowel or bladder control.  No concern for cauda equina.  No fever, night sweats, weight loss, h/o cancer, IVDU.  RICE protocol and pain medicine indicated and discussed with patient.    Final Clinical Impressions(s) / ED Diagnoses   Final diagnoses:  Chronic low back pain without sciatica, unspecified back pain laterality  Scoliosis of lumbar spine, unspecified scoliosis type    New Prescriptions New Prescriptions   BACLOFEN (LIORESAL) 10 MG TABLET    Take 1 tablet (10 mg total) by mouth 3 (three) times daily.     Margarita Mail, PA-C 05/20/17 2761    Merryl Hacker, MD 05/20/17 [phone]

## 2017-08-15 ENCOUNTER — Other Ambulatory Visit (HOSPITAL_COMMUNITY)
Admission: RE | Admit: 2017-08-15 | Discharge: 2017-08-15 | Disposition: A | Discharge status: Home / Self Care | Payer: 59 | Source: Ambulatory Visit | Attending: Infectious Disease | Admitting: Infectious Disease

## 2017-08-15 ENCOUNTER — Ambulatory Visit (INDEPENDENT_AMBULATORY_CARE_PROVIDER_SITE_OTHER): Payer: 59 | Admitting: Pharmacist Clinician (PhC)/ Clinical Pharmacy Specialist

## 2017-08-15 DIAGNOSIS — B20 Human immunodeficiency virus [HIV] disease: Secondary | ICD-10-CM

## 2017-08-15 DIAGNOSIS — C20 Malignant neoplasm of rectum: Secondary | ICD-10-CM | POA: Diagnosis present

## 2017-08-15 LAB — T-HELPER CELL (CD4) - (RCID CLINIC ONLY)
CD4 T CELL ABS: 450 /uL (ref 400–2700)
CD4 T CELL HELPER: 28 % — AB (ref 33–55)

## 2017-08-15 NOTE — Patient Instructions (Signed)
Continue your [employer]

## 2017-08-15 NOTE — Progress Notes (Signed)
HPI: Travis Palmer is a 21 y.o. male who walked in today to see if we can see her today instead of next week.   Allergies: Allergies  Allergen Reactions  . Depakote [Divalproex Sodium] Other (See Comments)    Hospital for 3 days for extreme GI upset    Vitals:    Past Medical History: Past Medical History:  Diagnosis Date  . History of bipolar disorder   . HIV infection ([place])   . Housing problems 08/26/2016  . Syphilis 08/02/2015    Social History: Social History   Social History  . Marital status: Single    Spouse name: N/A  . Number of children: N/A  . Years of education: N/A   Social History Main Topics  . Smoking status: Current Some Day Smoker    Packs/day: 0.00    Types: Cigarettes  . Smokeless tobacco: Never Used  . Alcohol use 0.0 oz/week     Comment: weekend drinker  . Drug use: Yes    Frequency: 7.0 times per week    Types: Marijuana     Comment: every other day  . Sexual activity: Not Currently     Comment: given condoms   Other Topics Concern  . Not on file   Social History Narrative  . No narrative on file    Previous Regimen: DTG/Descovy  Current Regimen: Biktarvy  Labs: HIV 1 RNA Quant (copies/mL)  Date Value  04/29/2017 173 (H)  08/08/2016 440,347 (H)  12/07/2014 140,743 (H)   HIV-1 RNA Viral Load (no units)  Date Value  11/06/2015 50  09/26/2015 175  07/19/2015 <40   CD4 (no units)  Date Value  07/19/2015 524  04/26/2015 427  03/07/2015 479   CD4 T Cell Abs (/uL)  Date Value  08/15/2017 450  04/29/2017 520  02/04/2017 300 (L)   Hep B S Ab (no units)  Date Value  12/07/2014 NEG   Hepatitis B Surface Ag (no units)  Date Value  01/09/2015 NEGATIVE   HCV Ab (no units)  Date Value  01/31/2015 NEGATIVE    CrCl: CrCl cannot be calculated (Patient's most recent lab result is older than the maximum 21 days allowed.).  Lipids:    Component Value Date/Time   CHOL 92 01/31/2015 1653   TRIG 90 01/31/2015 1653   HDL 28 (L) 01/31/2015 1653   CHOLHDL 3.3 01/31/2015 1653   VLDL 18 01/31/2015 1653   LDLCALC 46 01/31/2015 1653    Assessment: Travis Palmer (used to be [name]) walked into out clinic today on a Friday to see if we can see her today rather than next week. She has been doing well on Biktarvy for his ART. Her VL has dropped down nicely in May. She states that he has been taking her ART very consistently but she doesn't always take it at the same time. This was also an issue at that last visit with Cassie. Counseled again on making a consistent schedule to take her ART. She was tested positive for GC at the last visit. Claimed to have the same partner. Not sure if partner treatment was ever done. Going to repeat all STIs testing today.   [place]. She had to post $350 bail to get out.   She has been happy so far with the transition with the effect of her estrogen. She asked if we could eventually increase the dose of her estrogen. Apparently, she will be seen with Dr. Loanne Drilling for  the hormone management in Oct. [name] working at the [employer] center and has active insurance. She was getting estrogen for free on [facility] but it does have copay with her insurance. She has been paying for it at this time.   Recommendations:  Continue Biktarvy 1 daily HIV labs Cont estrodiol 4mg  qday Cont aldactone 100mg  qday F/u with Dr. Tommy Medal in Oct.   Ronan Dion, PharmD, BCPS, AAHIVP, CPP Clinical Infect[facility] 08/15/2017, 3:22 PM

## 2017-08-16 LAB — RPR: RPR Ser Ql: NONREACTIVE

## 2017-08-18 LAB — HIV-1 RNA QUANT-NO REFLEX-BLD
HIV 1 RNA QUANT: DETECTED {copies}/mL — AB
HIV-1 RNA Quant, Log: <1.3 Log copies/mL — AB

## 2017-08-18 LAB — CYTOLOGY, (ORAL, ANAL, URETHRAL) ANCILLARY ONLY
CHLAMYDIA, DNA PROBE: NEGATIVE
Chlamydia: NEGATIVE
Neisseria Gonorrhea: NEGATIVE
Neisseria Gonorrhea: NEGATIVE

## 2017-08-18 LAB — URINE CYTOLOGY ANCILLARY ONLY
Chlamydia: NEGATIVE
Neisseria Gonorrhea: NEGATIVE

## 2017-08-20 ENCOUNTER — Ambulatory Visit: Payer: Self-pay

## 2017-09-01 ENCOUNTER — Encounter (HOSPITAL_COMMUNITY): Payer: Self-pay | Admitting: Emergency Medicine

## 2017-09-01 ENCOUNTER — Emergency Department (HOSPITAL_COMMUNITY)
Admission: EM | Admit: 2017-09-01 | Discharge: 2017-09-01 | Discharge status: Against Medical Advice | Payer: 59 | Attending: Emergency Medicine | Admitting: Emergency Medicine

## 2017-09-01 DIAGNOSIS — Z79899 Other long term (current) drug therapy: Secondary | ICD-10-CM | POA: Diagnosis not present

## 2017-09-01 DIAGNOSIS — R109 Unspecified abdominal pain: Secondary | ICD-10-CM | POA: Diagnosis present

## 2017-09-01 DIAGNOSIS — B20 Human immunodeficiency virus [HIV] disease: Secondary | ICD-10-CM | POA: Insufficient documentation

## 2017-09-01 DIAGNOSIS — R1010 Upper abdominal pain, unspecified: Secondary | ICD-10-CM

## 2017-09-01 LAB — URINALYSIS, ROUTINE W REFLEX MICROSCOPIC
BILIRUBIN URINE: NEGATIVE
Glucose, UA: NEGATIVE mg/dL
HGB URINE DIPSTICK: NEGATIVE
KETONES UR: NEGATIVE mg/dL
Leukocytes, UA: NEGATIVE
NITRITE: NEGATIVE
PROTEIN: NEGATIVE mg/dL
SPECIFIC GRAVITY, URINE: 1.028 (ref 1.005–1.030)
pH: 5 (ref 5.0–8.0)

## 2017-09-01 LAB — CBC
HCT: 39.6 % (ref 39.0–52.0)
HEMOGLOBIN: 13.6 g/dL (ref 13.0–17.0)
MCH: 32.8 pg (ref 26.0–34.0)
MCHC: 34.3 g/dL (ref 30.0–36.0)
MCV: 95.4 fL (ref 78.0–100.0)
PLATELETS: 312 10*3/uL (ref 150–400)
RBC: 4.15 MIL/uL — ABNORMAL LOW (ref 4.22–5.81)
RDW: 11.9 % (ref 11.5–15.5)
WBC: 4.1 10*3/uL (ref 4.0–10.5)

## 2017-09-01 LAB — COMPREHENSIVE METABOLIC PANEL
ALK PHOS: 34 U/L — AB (ref 38–126)
ALT: 10 U/L — ABNORMAL LOW (ref 17–63)
ANION GAP: 6 (ref 5–15)
AST: 16 U/L (ref 15–41)
Albumin: 4.5 g/dL (ref 3.5–5.0)
BUN: 14 mg/dL (ref 6–20)
CALCIUM: 9.4 mg/dL (ref 8.9–10.3)
CHLORIDE: 106 mmol/L (ref 101–111)
CO2: 23 mmol/L (ref 22–32)
Creatinine, Ser: 0.92 mg/dL (ref 0.61–1.24)
GFR calc non Af Amer: >60 mL/min (ref >60)
Glucose, Bld: 95 mg/dL (ref 65–99)
Potassium: 4.1 mmol/L (ref 3.5–5.1)
SODIUM: 135 mmol/L (ref 135–145)
Total Bilirubin: 1 mg/dL (ref 0.3–1.2)
Total Protein: 7.2 g/dL (ref 6.5–8.1)

## 2017-09-01 LAB — LIPASE, BLOOD: LIPASE: 27 U/L (ref 11–51)

## 2017-09-01 MED ORDER — HYDROCODONE-ACETAMINOPHEN 5-325 MG PO TABS: 1.0000 | ORAL_TABLET | ORAL | 0 refills | Status: DC | PRN

## 2017-09-01 MED ORDER — OMEPRAZOLE 20 MG PO CPDR: 20.0000 mg | DELAYED_RELEASE_CAPSULE | Freq: Every day | ORAL | 0 refills | Status: DC

## 2017-09-01 NOTE — ED Notes (Signed)
MD at bedside. 

## 2017-09-01 NOTE — ED Provider Notes (Signed)
[facility] DEPT Provider Note   CSN: [id number] Arrival date & time: 09/01/17  1522     History   Chief Complaint Chief Complaint  Patient presents with  . Abdominal Pain    HPI Travis Palmer is a 21 y.o. male.  HPI Patient presents with upper abdominal pain. His had it for the last 3 days. Dull. Only eats around One-A-Day but states eating does not associate change the pain. No nausea or vomiting. Has had previous pancreatitis and thinks it feels somewhat like this. No fevers. No change in the stools. Occasional alcohol use. Also has had known gallstones. Pain is dull. States it is feeling better now since she waited in the waiting room for 7 hours. [name] very upset about the wait. Denies drinking alcohol. States she was told she needs to have her gallbladder out but has not had it done. [name] on hormone therapy and wonders if this could be causing some of the pain.   Past Medical History:  Diagnosis Date  . History of bipolar disorder   . HIV infection ([place])   . Housing problems 08/26/2016  . Syphilis 08/02/2015    Patient Active Problem List   Diagnosis Date Noted  . 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 Chlamydia 05/07/2017  . Housing problems 08/26/2016  . Syphilis 08/02/2015  . HIV disease ([place]) 01/09/2015  . 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 Cigarette smoker 01/09/2015    Past Surgical History:  Procedure Laterality Date  . [place] EXTRACTION  2014       Home Medications    Prior to Admission medications   Medication Sig Start Date End Date Taking? Authorizing Provider  baclofen (LIORESAL) 10 MG tablet Take 1 tablet (10 mg total) by mouth 3 (three) times daily. 05/20/17   Harris, Abigail, PA-C  bictegravir-emtricitabine-tenofovir AF (BIKTARVY) 50-200-25 MG TABS tablet Take 1 tablet by mouth daily. 04/29/17   Kuppelweiser, Cassie L, RPH-CPP  estradiol (ESTRACE) 2 MG tablet Take 2 tablets (4 mg total) by mouth daily. 04/29/17   Kuppelweiser, Cassie L,  RPH-CPP  HYDROcodone-acetaminophen (NORCO/VICODIN) 5-325 MG tablet Take 1-2 tablets by mouth every 4 (four) hours as needed. 09/01/17   Davonna Belling, MD  omeprazole (PRILOSEC) 20 MG capsule Take 1 capsule (20 mg total) by mouth daily. 09/01/17   Davonna Belling, MD  spironolactone (ALDACTONE) 100 MG tablet Take 1 tablet (100 mg total) by mouth daily. 04/29/17   Kuppelweiser, Cassie L, RPH-CPP    Family History Family History  Problem Relation Age of Onset  . Lactose intolerance Mother   . Cancer Maternal Aunt   . Hypertension Maternal Grandmother     Social History Social History  Substance Use Topics  . Smoking status: Current Some Day Smoker    Packs/day: 0.00    Types: Cigarettes  . Smokeless tobacco: Never Used  . Alcohol use 0.0 oz/week     Comment: weekend drinker     Allergies   Depakote [divalproex sodium]   Review of Systems Review of Systems  Constitutional: Negative for chills, fever and unexpected weight change.  HENT: Negative for dental problem.   Respiratory: Negative for shortness of breath.   Cardiovascular: Negative for chest pain.  Gastrointestinal: Positive for abdominal pain.  Genitourinary: Negative for flank pain.  Musculoskeletal: Negative for back pain.  Neurological: Negative for seizures.  Hematological: Negative for adenopathy.  Psychiatric/Behavioral: Negative for confusion.     Physical Exam Updated Vital Signs BP 126/79 (BP Location: Right Arm)   Pulse  77   Temp 98.5 F (36.9 C) (Oral)   Resp 18   Ht 5\' 7"  (1.702 m)   SpO2 99%   Physical Exam  Constitutional: He appears well-developed.  HENT:  Head: Atraumatic.  Neck: Neck supple.  Cardiovascular: Normal rate.   Pulmonary/Chest: Effort normal.  Abdominal: There is tenderness.  Upper abdominal tenderness and right upper epigastric and left upper quadrant. No rebound or guarding.  Musculoskeletal: He exhibits no edema.  Neurological: He is alert.  Skin: Skin is warm.       ED Treatments / Results  Labs (all labs ordered are listed, but only abnormal results are displayed) Labs Reviewed  COMPREHENSIVE METABOLIC PANEL - Abnormal; Notable for the following:       Result Value   ALT 10 (*)    Alkaline Phosphatase 34 (*)    All other components within normal limits  CBC - Abnormal; Notable for the following:    RBC 4.15 (*)    All other components within normal limits  LIPASE, BLOOD  URINALYSIS, ROUTINE W REFLEX MICROSCOPIC    EKG  EKG Interpretation None       Radiology No results found.  Procedures Procedures (including critical care time)  Medications Ordered in ED Medications - No data to display   Initial Impression / Assessment and Plan / ED Course  I have reviewed the triage vital signs and the nursing notes.  Pertinent labs & imaging results that were available during my care of the patient were reviewed by me and considered in my medical decision making (see chart for details).     Patient with upper abdominal pain. Has had the same in the past. Lab work reassuring. Still potentially could be a pancreatitis. Discussed with patient about the possibility of further imaging. She does not want a CT scan at this time. Gastritis possible. Biliary diseases known but lab work reassuring and less likely cholecystitis or choledocholithiasis. Will discharge home with some pain medicine and antacids. Will follow-up with primary care doctor and will need to follow with general surgery also. She has follow-up with her endocrinologist soon.  Final Clinical Impressions(s) / ED Diagnoses   Final diagnoses:  Pain of upper abdomen    New Prescriptions Discharge Medication List as of 09/01/2017 11:03 PM    START taking these medications   Details  HYDROcodone-acetaminophen (NORCO/VICODIN) 5-325 MG tablet Take 1-2 tablets by mouth every 4 (four) hours as needed., Starting Mon 09/01/2017, Print    omeprazole (PRILOSEC) 20 MG capsule Take 1  capsule (20 mg total) by mouth daily., Starting Mon 09/01/2017, Print         Davonna Belling, MD 09/01/17 2337

## 2017-09-01 NOTE — ED Notes (Signed)
Patient able to ambulate independently  

## 2017-09-01 NOTE — ED Triage Notes (Signed)
Pt. Stated, I started having upper left stomach pain that started on Friday.

## 2017-09-01 NOTE — ED Notes (Signed)
Patient asked for urine sample.states that is unable to give sample at this time.Travis Palmer

## 2017-09-01 NOTE — ED Notes (Signed)
Called for pt. X 1 no answer

## 2017-09-01 NOTE — ED Notes (Addendum)
Patient called out.  Patient yelling at this RN stating they have not been seen yet and how irresponsible we are, and how they can't believe we would have them wait this long to let them know "that there must be nothing wrong with me".  This RN attempted to apologize, patient did not want this RN to continue speaking to them.  This RN stated she would notify the MD of their concerns.

## 2017-09-10 ENCOUNTER — Telehealth: Payer: Self-pay | Admitting: Pharmacist

## 2017-09-10 NOTE — Telephone Encounter (Signed)
Received refill request for patient's spironolactone and estradiol.  Will route to Dr. Tommy Medal for advice.

## 2017-09-10 NOTE — Telephone Encounter (Signed)
Is she keeping her ID appts and taking her ARV? If so I am ok to refill

## 2017-09-11 ENCOUNTER — Other Ambulatory Visit: Payer: Self-pay | Admitting: Pharmacist

## 2017-09-11 DIAGNOSIS — F640 Transsexualism: Secondary | ICD-10-CM

## 2017-09-11 DIAGNOSIS — Z789 Other specified health status: Secondary | ICD-10-CM

## 2017-09-11 MED ORDER — ESTRADIOL 2 MG PO TABS: 4.0000 mg | ORAL_TABLET | Freq: Every day | ORAL | 3 refills | Status: DC

## 2017-09-11 MED ORDER — SPIRONOLACTONE 100 MG PO TABS: 100.0000 mg | ORAL_TABLET | Freq: Every day | ORAL | 3 refills | Status: DC

## 2017-09-11 NOTE — Telephone Encounter (Signed)
She was undetectable on 9/7 so yes I guess so. She has an appt with you 10/22.

## 2017-09-11 NOTE — Telephone Encounter (Signed)
Let's make it so.

## 2017-09-29 ENCOUNTER — Ambulatory Visit: Payer: 59 | Admitting: Infectious Disease

## 2017-10-01 ENCOUNTER — Ambulatory Visit (INDEPENDENT_AMBULATORY_CARE_PROVIDER_SITE_OTHER): Payer: Self-pay | Admitting: Infectious Disease

## 2017-10-01 ENCOUNTER — Encounter: Payer: Self-pay | Admitting: Infectious Disease

## 2017-10-01 VITALS — BP 143/84 | HR 61 | Temp 98.4°F | Ht 67.5 in | Wt 159.0 lb

## 2017-10-01 DIAGNOSIS — R1013 Epigastric pain: Secondary | ICD-10-CM | POA: Insufficient documentation

## 2017-10-01 DIAGNOSIS — F640 Transsexualism: Secondary | ICD-10-CM

## 2017-10-01 DIAGNOSIS — A539 Syphilis, unspecified: Secondary | ICD-10-CM

## 2017-10-01 DIAGNOSIS — A749 Chlamydial infection, unspecified: Secondary | ICD-10-CM

## 2017-10-01 DIAGNOSIS — A549 Gonococcal infection, unspecified: Secondary | ICD-10-CM

## 2017-10-01 DIAGNOSIS — Z789 Other specified health status: Secondary | ICD-10-CM

## 2017-10-01 DIAGNOSIS — B20 Human immunodeficiency virus [HIV] disease: Secondary | ICD-10-CM

## 2017-10-01 HISTORY — DX: Epigastric pain: R10.13

## 2017-10-01 NOTE — Progress Notes (Signed)
Subjective:   Chief complaint: epigastric pain    Patient ID: Travis Palmer, male    DOB: 15-Oct-1996, 21 y.o.   MRN: [id number]  HPI  Travis Palmer  returns for followup for HIV care. [name] on BIKTARVY and VL is suppressed.  Lab Results  Component Value Date   HIV1RNAQUANT <20 DETECTED (A) 08/15/2017   HIV1RNAQUANT 173 (H) 04/29/2017   HIV1RNAQUANT 409,811 (H) 08/08/2016   She claims that she had insurance but now no longer has it. I have emphasized need to renew ADAP today and to do Advancing Access to ensure there is no interruption in therapy.  She has been suffering from epigastric pain and has been seen in ED for this. She claims that the ER staff "want me to have my gallbladder taken out." I reviewed the most recent ED note and ED MD voiced that he did not thinks she had gallstones but they had contemplated imaging.  She still has some pain there today that was severe this weekend when she stopped eating much food, hydrated herself and had improvement.      Past Medical History:  Diagnosis Date  . History of bipolar disorder   . HIV infection ([address])   . Housing problems 08/26/2016  . Syphilis 08/02/2015    Past Surgical History:  Procedure Laterality Date  . WISDOM TOOTH EXTRACTION  2014    Family History  Problem Relation Age of Onset  . Lactose intolerance Mother   . Cancer Maternal Aunt   . Hypertension Maternal Grandmother       Social History   Social History  . Marital status: Single    Spouse name: N/A  . Number of children: N/A  . Years of education: N/A   Social History Main Topics  . Smoking status: Current Some Day Smoker    Packs/day: 0.00    Types: Cigarettes  . Smokeless tobacco: Never Used  . Alcohol use 0.0 oz/week     Comment: weekend drinker  . Drug use: Yes    Frequency: 7.0 times per week    Types: Marijuana     Comment: every other day  . Sexual activity: Not Currently     Comment: given condoms   Other Topics Concern  . None     Social History Narrative  . None    Allergies  Allergen Reactions  . Depakote [Divalproex Sodium] Other (See Comments)    Hospitalized for 3 days for extreme GI upset because of this     Current Outpatient Prescriptions:  .  bictegravir-emtricitabine-tenofovir AF (BIKTARVY) 50-200-25 MG TABS tablet, Take 1 tablet by mouth daily., Disp: 30 tablet, Rfl: 5 .  estradiol (ESTRACE) 2 MG tablet, Take 2 tablets (4 mg total) by mouth daily., Disp: 60 tablet, Rfl: 3 .  HYDROcodone-acetaminophen (NORCO/VICODIN) 5-325 MG tablet, Take 1-2 tablets by mouth every 4 (four) hours as needed., Disp: 6 tablet, Rfl: 0 .  baclofen (LIORESAL) 10 MG tablet, Take 1 tablet (10 mg total) by mouth 3 (three) times daily. (Patient not taking: Reported on 10/01/2017), Disp: 30 each, Rfl: 0 .  omeprazole (PRILOSEC) 20 MG capsule, Take 1 capsule (20 mg total) by mouth daily. (Patient not taking: Reported on 10/01/2017), Disp: 14 capsule, Rfl: 0 .  spironolactone (ALDACTONE) 100 MG tablet, Take 1 tablet (100 mg total) by mouth daily. (Patient not taking: Reported on 10/01/2017), Disp: 30 tablet, Rfl: 3      Review of Systems  Constitutional: Negative for activity change, appetite change,  chills, diaphoresis, fatigue, fever and unexpected weight change.  HENT: Negative for congestion, rhinorrhea, sinus pressure, sneezing, sore throat and trouble swallowing.   Eyes: Negative for photophobia and visual disturbance.  Respiratory: Negative for cough, chest tightness, shortness of breath, wheezing and stridor.   Cardiovascular: Negative for chest pain, palpitations and leg swelling.  Gastrointestinal: Positive for abdominal pain and constipation. Negative for abdominal distention, anal bleeding, blood in stool, diarrhea, nausea and vomiting.  Genitourinary: Negative for difficulty urinating, dysuria, flank pain and hematuria.  Musculoskeletal: Negative for arthralgias, back pain, gait problem, joint swelling and  myalgias.  Skin: Negative for color change, pallor, rash and wound.  Neurological: Negative for dizziness, tremors, weakness and light-headedness.  Hematological: Negative for adenopathy. Does not bruise/bleed easily.  Psychiatric/Behavioral: Negative for agitation, behavioral problems, confusion, decreased concentration, dysphoric mood and sleep disturbance.       Objective:   Physical Exam  Constitutional: He is oriented to person, place, and time. He appears well-developed and well-nourished.  HENT:  Head: Normocephalic and atraumatic.  Eyes: Conjunctivae and EOM are normal.  Neck: Normal range of motion. Neck supple.  Cardiovascular: Normal rate and regular rhythm.   Pulmonary/Chest: Effort normal. No respiratory distress. He has no wheezes.  Abdominal: Soft. Bowel sounds are normal. He exhibits no distension. There is no tenderness. There is no rebound and no guarding.  Musculoskeletal: Normal range of motion. He exhibits no edema or tenderness.  Neurological: He is alert and oriented to person, place, and time.  Skin: Skin is warm and dry. No rash noted. No erythema. No pallor.  Psychiatric: His speech is normal. Judgment and thought content normal. His mood appears not anxious. He is not hyperactive. Cognition and memory are normal. He does not exhibit a depressed mood.  Nursing note and vitals reviewed.         Assessment & Plan:   HIV: continue BIKTARVY and renew ADAP, RTC to see me in January  Transgender: Continue aldactone and estradiol  Epigatric pain: not sure what is causing this. Perhaps it is constipation as she had suggested hersefl  Syphilis: sp mx treatments. .NON-REACTIVE (09/07 1136)  I

## 2017-10-03 ENCOUNTER — Encounter: Payer: Self-pay | Admitting: Endocrinology

## 2017-10-03 ENCOUNTER — Ambulatory Visit (INDEPENDENT_AMBULATORY_CARE_PROVIDER_SITE_OTHER): Payer: Self-pay | Admitting: Endocrinology

## 2017-10-03 ENCOUNTER — Encounter: Payer: Self-pay | Admitting: Infectious Disease

## 2017-10-03 VITALS — BP 128/82 | HR 86 | Wt 158.8 lb

## 2017-10-03 DIAGNOSIS — F640 Transsexualism: Secondary | ICD-10-CM

## 2017-10-03 DIAGNOSIS — Z789 Other specified health status: Secondary | ICD-10-CM

## 2017-10-03 NOTE — Patient Instructions (Signed)
It would be best to have the testicles removed.  I would be happy to refer you to a specialist.  Also,please think over what we discussed today, and let us know if you decide to get the blood tests.

## 2017-10-03 NOTE — Progress Notes (Signed)
Subjective:    Patient ID: Travis Palmer, male    DOB: 01/18/1996, 21 y.o.   MRN: [id number]  HPI Pt is referred by Dr Tommy Medal, for transgender state (M to F).  Pt reports she first suspected the transgender state at age 35.  She had puberty at the age of 77.  She has never had transgender surgery, but she goes to counseling for this.  She has no children.  She has no h/o DVT, dyslipidemia, liver disease, kidney disease, heart disease, CVA, diabetes, smoking, blood disorders, osteoporosis, or cancer.  She had gallstone pancreatitis in 2017.  She has been on estrace x 2 years.  Since then, she has slight breast development, but no assoc leg edema.  She has no health insurance.   Past Medical History:  Diagnosis Date  . Epigastric pain 10/01/2017  . History of bipolar disorder   . HIV infection ([place])   . Housing problems 08/26/2016  . Syphilis 08/02/2015    Past Surgical History:  Procedure Laterality Date  . WISDOM TOOTH EXTRACTION  2014    Social History   Social History  . Marital status: Single    Spouse name: N/A  . Number of children: N/A  . Years of education: N/A   Occupational History  . Not on file.   Social History Main Topics  . Smoking status: Current Some Day Smoker    Packs/day: 0.00    Types: Cigarettes  . Smokeless tobacco: Never Used  . Alcohol use 0.0 oz/week     Comment: weekend drinker  . Drug use: Yes    Frequency: 7.0 times per week    Types: Marijuana     Comment: every other day  . Sexual activity: Not Currently     Comment: given condoms   Other Topics Concern  . Not on file   Social History Narrative  . No narrative on file    Current Outpatient Prescriptions on File Prior to Visit  Medication Sig Dispense Refill  . bictegravir-emtricitabine-tenofovir AF (BIKTARVY) 50-200-25 MG TABS tablet Take 1 tablet by mouth daily. 30 tablet 5  . estradiol (ESTRACE) 2 MG tablet Take 2 tablets (4 mg total) by mouth daily. 60 tablet 3  . spironolactone  (ALDACTONE) 100 MG tablet Take 1 tablet (100 mg total) by mouth daily. 30 tablet 3   No current facility-administered medications on file prior to visit.     Allergies  Allergen Reactions  . Depakote [Divalproex Sodium] Other (See Comments)    Hospitalized for 3 days for extreme GI upset because of this    Family History  Problem Relation Age of Onset  . Lactose intolerance Mother   . Cancer Maternal Aunt   . Hypertension Maternal Grandmother     BP 128/82   Pulse 86   Wt 158 lb 12.8 oz (72 kg)   SpO2 97%   BMI 24.50 kg/m     Review of Systems Denies weight change, headache, visual loss, chest pain, sob, nausea, hematuria, acne, easy bruising, seizure, edema, myalgias, anxiety, heat intolerance, rhinorrhea, and excessive diaphoresis.      Objective:   Physical Exam VS: see vs page GEN: no distress HEAD: head: no deformity eyes: no periorbital swelling, no proptosis external nose and ears are normal mouth: no lesion seen NECK: supple, thyroid is not enlarged CHEST WALL: no deformity LUNGS: clear to auscultation BREASTS:  Tanner 2 CV: reg rate and rhythm, no murmur ABD: abdomen is soft, nontender.  no hepatosplenomegaly.  not distended.  no hernia.  GENITALIA:  Normal male, except testicles are small and soft MUSCULOSKELETAL: muscle bulk and strength are grossly normal.  no obvious joint swelling.  gait is normal and steady EXTEMITIES: no deformity.  no ulcer on the feet.  feet are of normal color and temp.  no edema PULSES: dorsalis pedis intact bilat.  no carotid bruit NEURO:  cn 2-12 grossly intact.   readily moves all 4's.  sensation is intact to touch on the feet SKIN:  Normal texture and temperature.  No rash or suspicious lesion is visible.   NODES:  None palpable at the neck PSYCH: alert, well-oriented.  Does not appear anxious nor depressed.  I have reviewed outside records, and summarized: Pt was noted to have transgender state, and referred here.  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    Patient Instructions  It would be best to have the testicles removed.  I would be happy to refer you to a specialist.  Also,please think over what we discussed today, and let us know if you decide to get the blood tests.

## 2017-10-16 ENCOUNTER — Other Ambulatory Visit: Payer: Self-pay | Admitting: Pharmacist

## 2017-10-16 ENCOUNTER — Other Ambulatory Visit: Payer: Self-pay | Admitting: Internal Medicine

## 2017-10-16 DIAGNOSIS — B20 Human immunodeficiency virus [HIV] disease: Secondary | ICD-10-CM

## 2017-10-16 MED ORDER — BICTEGRAVIR-EMTRICITAB-TENOFOV 50-200-25 MG PO TABS: 1.0000 | ORAL_TABLET | Freq: Every day | ORAL | 5 refills | Status: DC

## 2017-12-11 ENCOUNTER — Encounter (HOSPITAL_COMMUNITY): Payer: Self-pay | Admitting: Emergency Medicine

## 2017-12-11 ENCOUNTER — Other Ambulatory Visit: Payer: Self-pay

## 2017-12-11 ENCOUNTER — Emergency Department (HOSPITAL_COMMUNITY)
Admission: EM | Admit: 2017-12-11 | Discharge: 2017-12-12 | Discharge status: Against Medical Advice | Payer: Medicaid Other | Attending: Emergency Medicine | Admitting: Emergency Medicine

## 2017-12-11 ENCOUNTER — Emergency Department (HOSPITAL_COMMUNITY): Payer: Medicaid Other

## 2017-12-11 DIAGNOSIS — Z5321 Procedure and treatment not carried out due to patient leaving prior to being seen by health care provider: Secondary | ICD-10-CM | POA: Insufficient documentation

## 2017-12-11 DIAGNOSIS — R0981 Nasal congestion: Secondary | ICD-10-CM | POA: Insufficient documentation

## 2017-12-11 NOTE — ED Notes (Signed)
No answer for triage.

## 2017-12-11 NOTE — ED Triage Notes (Signed)
Pt reports flu like S/S X2 days, cough, nasal congestion, sore throat, fevers/chills.

## 2017-12-12 LAB — URINALYSIS, ROUTINE W REFLEX MICROSCOPIC
BACTERIA UA: NONE SEEN
Bilirubin Urine: NEGATIVE
GLUCOSE, UA: NEGATIVE mg/dL
Hgb urine dipstick: NEGATIVE
KETONES UR: 5 mg/dL — AB
Leukocytes, UA: NEGATIVE
Nitrite: NEGATIVE
PROTEIN: 30 mg/dL — AB
Specific Gravity, Urine: 1.03 (ref 1.005–1.030)
pH: 5 (ref 5.0–8.0)

## 2017-12-12 LAB — BASIC METABOLIC PANEL
Anion gap: 11 (ref 5–15)
BUN: 10 mg/dL (ref 6–20)
CHLORIDE: 96 mmol/L — AB (ref 101–111)
CO2: 24 mmol/L (ref 22–32)
CREATININE: 1 mg/dL (ref 0.61–1.24)
Calcium: 8.8 mg/dL — ABNORMAL LOW (ref 8.9–10.3)
GFR calc Af Amer: >60 mL/min (ref >60)
GFR calc non Af Amer: >60 mL/min (ref >60)
GLUCOSE: 98 mg/dL (ref 65–99)
POTASSIUM: 3.5 mmol/L (ref 3.5–5.1)
SODIUM: 131 mmol/L — AB (ref 135–145)

## 2017-12-12 LAB — CBC
HEMATOCRIT: 42.7 % (ref 39.0–52.0)
HEMOGLOBIN: 15.2 g/dL (ref 13.0–17.0)
MCH: 33.8 pg (ref 26.0–34.0)
MCHC: 35.6 g/dL (ref 30.0–36.0)
MCV: 94.9 fL (ref 78.0–100.0)
Platelets: 254 10*3/uL (ref 150–400)
RBC: 4.5 MIL/uL (ref 4.22–5.81)
RDW: 11.9 % (ref 11.5–15.5)
WBC: 12.1 10*3/uL — ABNORMAL HIGH (ref 4.0–10.5)

## 2017-12-12 NOTE — ED Notes (Signed)
No answer for vitals  

## 2017-12-15 ENCOUNTER — Other Ambulatory Visit: Payer: Self-pay

## 2017-12-15 ENCOUNTER — Emergency Department (HOSPITAL_COMMUNITY)
Admission: EM | Admit: 2017-12-15 | Discharge: 2017-12-15 | Disposition: A | Discharge status: Home / Self Care | Payer: Medicaid Other | Attending: Emergency Medicine | Admitting: Emergency Medicine

## 2017-12-15 ENCOUNTER — Emergency Department (HOSPITAL_COMMUNITY): Payer: Medicaid Other

## 2017-12-15 ENCOUNTER — Encounter (HOSPITAL_COMMUNITY): Payer: Self-pay

## 2017-12-15 DIAGNOSIS — R05 Cough: Secondary | ICD-10-CM | POA: Insufficient documentation

## 2017-12-15 DIAGNOSIS — R0602 Shortness of breath: Secondary | ICD-10-CM | POA: Insufficient documentation

## 2017-12-15 DIAGNOSIS — Z79899 Other long term (current) drug therapy: Secondary | ICD-10-CM | POA: Insufficient documentation

## 2017-12-15 DIAGNOSIS — J329 Chronic sinusitis, unspecified: Secondary | ICD-10-CM | POA: Insufficient documentation

## 2017-12-15 DIAGNOSIS — J111 Influenza due to unidentified influenza virus with other respiratory manifestations: Secondary | ICD-10-CM

## 2017-12-15 DIAGNOSIS — J3489 Other specified disorders of nose and nasal sinuses: Secondary | ICD-10-CM | POA: Insufficient documentation

## 2017-12-15 DIAGNOSIS — B20 Human immunodeficiency virus [HIV] disease: Secondary | ICD-10-CM | POA: Insufficient documentation

## 2017-12-15 DIAGNOSIS — M7918 Myalgia, other site: Secondary | ICD-10-CM | POA: Insufficient documentation

## 2017-12-15 DIAGNOSIS — R69 Illness, unspecified: Secondary | ICD-10-CM | POA: Insufficient documentation

## 2017-12-15 DIAGNOSIS — F17210 Nicotine dependence, cigarettes, uncomplicated: Secondary | ICD-10-CM | POA: Insufficient documentation

## 2017-12-15 DIAGNOSIS — R51 Headache: Secondary | ICD-10-CM | POA: Insufficient documentation

## 2017-12-15 MED ORDER — ALBUTEROL SULFATE (2.5 MG/3ML) 0.083% IN NEBU
5.0000 mg | INHALATION_SOLUTION | Freq: Once | RESPIRATORY_TRACT | Status: AC
  Administered 2017-12-15: 5 mg via RESPIRATORY_TRACT
  Filled 2017-12-15: qty 6

## 2017-12-15 MED ORDER — AMOXICILLIN 500 MG PO CAPS: 1000.0000 mg | ORAL_CAPSULE | Freq: Two times a day (BID) | ORAL | 0 refills | Status: DC

## 2017-12-15 MED ORDER — CEFTRIAXONE SODIUM 1 G IJ SOLR
1.0000 g | Freq: Once | INTRAMUSCULAR | Status: AC
  Administered 2017-12-15: 1 g via INTRAMUSCULAR
  Filled 2017-12-15: qty 10

## 2017-12-15 MED ORDER — OSELTAMIVIR PHOSPHATE 75 MG PO CAPS: 75.0000 mg | ORAL_CAPSULE | Freq: Two times a day (BID) | ORAL | 0 refills | Status: DC

## 2017-12-15 MED ORDER — OSELTAMIVIR PHOSPHATE 75 MG PO CAPS
75.0000 mg | ORAL_CAPSULE | Freq: Once | ORAL | Status: AC
  Administered 2017-12-15: 75 mg via ORAL
  Filled 2017-12-15: qty 1

## 2017-12-15 MED ORDER — STERILE WATER FOR INJECTION IJ SOLN
INTRAMUSCULAR | Status: AC
  Administered 2017-12-15: 10 mL
  Filled 2017-12-15: qty 10

## 2017-12-15 MED ORDER — HYDROCODONE-ACETAMINOPHEN 5-325 MG PO TABS: ORAL_TABLET | ORAL | 0 refills | Status: DC

## 2017-12-15 NOTE — ED Notes (Signed)
Bed: WTR8 Expected date:  Expected time:  Means of arrival:  Comments: 

## 2017-12-15 NOTE — ED Provider Notes (Signed)
[facility] DEPT Provider Note   CSN: [id number] Arrival date & time: 12/15/17  9528     History   Chief Complaint Chief Complaint  Patient presents with  . flu like symptoms    HPI  Blood pressure (!) 146/81, pulse 89, temperature 98.5 F (36.9 C), temperature source Oral, resp. rate 18, height 5\' 10"  (1.778 m), weight 72.6 kg (160 lb), SpO2 98 %.  Travis Palmer is a 22 y.o. adult with past medical history significant for HIV (takes his antiretroviral medication regularly last CD4 count was over 500, last viral load undetectable) complaining of tactile fever, chills, cough, myalgia, headache, profuse rhinorrhea with sinus pain and pressure with some shortness of breath starting 1 week ago.  Did not take flu shot this year.  Past Medical History:  Diagnosis Date  . Epigastric pain 10/01/2017  . History of bipolar disorder   . HIV infection ([place])   . Housing problems 08/26/2016  . Syphilis 08/02/2015    Patient Active Problem List   Diagnosis Date Noted  . Epigastric pain 10/01/2017  . 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 Chlamydia 05/07/2017  . Housing problems 08/26/2016  . Syphilis 08/02/2015  . HIV disease ([place]) 01/09/2015  . 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 Cigarette smoker 01/09/2015    Past Surgical History:  Procedure Laterality Date  . [place]dy EXTRACTION  2014       Home Medications    Prior to Admission medications   Medication Sig Start Date End Date Taking? Authorizing Provider  bictegravir-emtricitabine-tenofovir AF (BIKTARVY) 50-200-25 MG TABS tablet Take 1 tablet daily by mouth. 10/16/17  Yes Kuppelweiser, Cassie L, RPH-CPP  diphenhydrAMINE (BENADRYL) 12.5 MG/5ML liquid Take 12.5 mg by mouth 4 (four) times daily as needed for sleep.   Yes [provider]  DM-Doxylamine-Acetaminophen (NYQUIL COLD & FLU PO) Take 15 mLs by mouth every 8 (eight) hours.   Yes [provider]  estradiol (ESTRACE) 2 MG  tablet Take 2 tablets (4 mg total) by mouth daily. 09/11/17  Yes Kuppelweiser, Cassie L, RPH-CPP  spironolactone (ALDACTONE) 100 MG tablet Take 1 tablet (100 mg total) by mouth daily. 09/11/17  Yes Kuppelweiser, Cassie L, RPH-CPP  amoxicillin (AMOXIL) 500 MG capsule Take 2 capsules (1,000 mg total) by mouth 2 (two) times daily. 12/15/17   Jaline Pincock, Elmyra Ricks, PA-C  BIKTARVY 50-200-25 MG TABS tablet TAKE 1 TABLET BY MOUTH DAILY Patient not taking: Reported on 12/15/2017 10/16/17   Michel Bickers, MD  HYDROcodone-acetaminophen (NORCO/VICODIN) 5-325 MG tablet Take 1-2 tablets by mouth every 6 hours as needed for pain and/or cough. 12/15/17   Marquis Diles, Elmyra Ricks, PA-C  oseltamivir (TAMIFLU) 75 MG capsule Take 1 capsule (75 mg total) by mouth every 12 (twelve) hours. 12/15/17   Jerardo Costabile, Elmyra Ricks, PA-C    Family History Family History  Problem Relation Age of Onset  . Lactose intolerance Mother   . Cancer Maternal Aunt   . Hypertension Maternal Grandmother     Social History Social History   Tobacco Use  . Smoking status: Current Some Day Smoker    Packs/day: 0.00    Types: Cigarettes  . Smokeless tobacco: Never Used  Substance Use Topics  . Alcohol use: Yes    Alcohol/week: 0.0 oz    Comment: weekend drinker  . Drug use: Yes    Frequency: 7.0 times per week    Types: Marijuana    Comment: every other day     Allergies   Depakote [divalproex sodium]   Review  of Systems Review of Systems  A complete review of systems was obtained and all systems are negative except as noted in the HPI and PMH.   Physical Exam Updated Vital Signs BP (!) 148/84 (BP Location: Right Arm)   Pulse 79   Temp 98.5 F (36.9 C) (Oral)   Resp 18   Ht 5\' 10"  (1.778 m)   Wt 72.6 kg (160 lb)   SpO2 99%   BMI 22.96 kg/m   Physical Exam  Constitutional: She appears well-developed and well-nourished.  HENT:  Head: Normocephalic.  Right Ear: External ear normal.  Left Ear: External ear normal.  Mouth/Throat:  Oropharynx is clear and moist. No oropharyngeal exudate.  No drooling or stridor. Posterior pharynx mildly erythematous no significant tonsillar hypertrophy. No exudate. Soft palate rises symmetrically. No TTP or induration under tongue.   Profuse purulent nasal discharge, with bilateral maxilary sinus TTP.   Mild mucosal edema in the nares with scant rhinorrhea.  Bilateral tympanic membranes with normal architecture and good light reflex.    Eyes: Conjunctivae and EOM are normal. Pupils are equal, round, and reactive to light.  Neck: Normal range of motion. Neck supple.  Cardiovascular: Normal rate and regular rhythm.  Pulmonary/Chest: Effort normal and breath sounds normal. No stridor. No respiratory distress. She has no wheezes. She has no rales. She exhibits no tenderness.  Abdominal: Soft. There is no tenderness. There is no rebound and no guarding.  Nursing note and vitals reviewed.    ED Treatments / Results  Labs (all labs ordered are listed, but only abnormal results are displayed) Labs Reviewed - No data to display  EKG  EKG Interpretation None       Radiology Dg Chest 2 View  Result Date: 12/15/2017 CLINICAL DATA:  Cough, joint pain and diarrhea for 4 days. EXAM: CHEST  2 VIEW COMPARISON:  PA and lateral chest 12/11/2017 and 11/14/2012. FINDINGS: The lungs are clear. Heart size is normal. No pneumothorax or pleural fluid. No bony abnormality. IMPRESSION: Normal chest. Electronically Signed   By: Inge Rise M.D.   On: 12/15/2017 10:30    Procedures Procedures (including critical care time)  Medications Ordered in ED Medications  cefTRIAXone (ROCEPHIN) injection 1 g (1 g Intramuscular Given 12/15/17 1212)  oseltamivir (TAMIFLU) capsule 75 mg (75 mg Oral Given 12/15/17 1208)  albuterol (PROVENTIL) (2.5 MG/3ML) 0.083% nebulizer solution 5 mg (5 mg Nebulization Given 12/15/17 1206)  sterile water (preservative free) injection (10 mLs  Given 12/15/17 1212)      Initial Impression / Assessment and Plan / ED Course  I have reviewed the triage vital signs and the nursing notes.  Pertinent labs & imaging results that were available during my care of the patient were reviewed by me and considered in my medical decision making (see chart for details).     Vitals:   12/15/17 0933 12/15/17 0937 12/15/17 0938 12/15/17 1212  BP:  (!) 146/81  (!) 148/84  Pulse:  89  79  Resp:  18  18  Temp:  98.5 F (36.9 C)    TempSrc:  Oral    SpO2: 95% 98%  99%  Weight:   72.6 kg (160 lb)   Height:   5\' 10"  (1.778 m)     Medications  cefTRIAXone (ROCEPHIN) injection 1 g (1 g Intramuscular Given 12/15/17 1212)  oseltamivir (TAMIFLU) capsule 75 mg (75 mg Oral Given 12/15/17 1208)  albuterol (PROVENTIL) (2.5 MG/3ML) 0.083% nebulizer solution 5 mg (5 mg Nebulization Given 12/15/17  1206)  sterile water (preservative free) injection (10 mLs  Given 12/15/17 1212)    MARTY UY is 22 y.o. adult presenting with influenza-like illness in addition to purulent sinusitis.  Patient with HIV but has good CD4 count.  Vital signs reassuring, will start on Tamiflu and amoxicillin for sinus infection.  Patient will follow with primary care for recheck, patient with difficulty breathing that I think is likely secondary to his upper respiratory issues, lung sounds are clear, patient given albuterol in the ED  Evaluation does not show pathology that would require ongoing emergent intervention or inpatient treatment. Pt is hemodynamically stable and mentating appropriately. Discussed findings and plan with patient/guardian, who agrees with care plan. All questions answered. Return precautions discussed and outpatient follow up given.   Final Clinical Impressions(s) / ED Diagnoses   Final diagnoses:  Influenza-like illness  Sinusitis, unspecified chronicity, unspecified location    ED Discharge Orders        Ordered    HYDROcodone-acetaminophen (NORCO/VICODIN) 5-325 MG tablet      12/15/17 1228    oseltamivir (TAMIFLU) 75 MG capsule  Every 12 hours     12/15/17 1228    amoxicillin (AMOXIL) 500 MG capsule  2 times daily     12/15/17 1228       Catrina Fellenz, Charna Elizabeth 12/15/17 [place], Ankit, MD 12/16/17 [id number]

## 2017-12-15 NOTE — Discharge Instructions (Signed)
Return to the emergency room for any worsening or concerning symptoms including fast breathing, heart racing, confusion, vomiting.  Rest, cover your mouth when you cough and wash your hands frequently.   Push fluids: water or Gatorade, do not drink any soda, juice or caffeinated beverages.  For fever and pain control you can take Motrin (ibuprofen) as follows: 400 mg (this is normally 2 over the counter pills) every 4 hours with food.  Do not return to work until a day after your fever breaks.   Take Vicodin for cough and pain control, do not drink alcohol, drive, care for children or do other critical tasks while taking Vicodin

## 2017-12-15 NOTE — ED Triage Notes (Signed)
Patient reports flu like symptoms of cough, joint pain diarrhea x 4 days. Patient reported to EMS that he took Ibuprofen 600 mg 1 1/2 hours ago for the first time in 4 days.

## 2018-01-26 ENCOUNTER — Other Ambulatory Visit: Payer: Self-pay | Admitting: Pharmacist

## 2018-01-26 ENCOUNTER — Encounter: Payer: Self-pay | Admitting: Infectious Disease

## 2018-01-26 ENCOUNTER — Ambulatory Visit (INDEPENDENT_AMBULATORY_CARE_PROVIDER_SITE_OTHER): Payer: Medicaid Other | Admitting: Infectious Disease

## 2018-01-26 ENCOUNTER — Other Ambulatory Visit (HOSPITAL_COMMUNITY)
Admission: RE | Admit: 2018-01-26 | Discharge: 2018-01-26 | Disposition: A | Discharge status: Home / Self Care | Payer: Medicaid Other | Source: Ambulatory Visit | Attending: Infectious Disease | Admitting: Infectious Disease

## 2018-01-26 VITALS — BP 126/88 | HR 64 | Temp 98.8°F | Ht 67.0 in | Wt 167.0 lb

## 2018-01-26 DIAGNOSIS — Z789 Other specified health status: Secondary | ICD-10-CM

## 2018-01-26 DIAGNOSIS — B20 Human immunodeficiency virus [HIV] disease: Secondary | ICD-10-CM | POA: Insufficient documentation

## 2018-01-26 DIAGNOSIS — Z23 Encounter for immunization: Secondary | ICD-10-CM

## 2018-01-26 DIAGNOSIS — A539 Syphilis, unspecified: Secondary | ICD-10-CM

## 2018-01-26 DIAGNOSIS — B9689 Other specified bacterial agents as the cause of diseases classified elsewhere: Secondary | ICD-10-CM | POA: Insufficient documentation

## 2018-01-26 DIAGNOSIS — F17210 Nicotine dependence, cigarettes, uncomplicated: Secondary | ICD-10-CM

## 2018-01-26 DIAGNOSIS — F640 Transsexualism: Secondary | ICD-10-CM

## 2018-01-26 DIAGNOSIS — Z113 Encounter for screening for infections with a predominantly sexual mode of transmission: Secondary | ICD-10-CM

## 2018-01-26 HISTORY — DX: Encounter for screening for infections with a predominantly sexual mode of transmission: Z11.3

## 2018-01-26 MED ORDER — ESTRADIOL 2 MG PO TABS: ORAL_TABLET | ORAL | 11 refills | Status: DC

## 2018-01-26 MED ORDER — SPIRONOLACTONE 100 MG PO TABS: 100.0000 mg | ORAL_TABLET | Freq: Every day | ORAL | 11 refills | Status: DC

## 2018-01-26 MED ORDER — BICTEGRAVIR-EMTRICITAB-TENOFOV 50-200-25 MG PO TABS: 1.0000 | ORAL_TABLET | Freq: Every day | ORAL | 11 refills | Status: DC

## 2018-01-26 MED ORDER — DOXYCYCLINE HYCLATE 100 MG PO TABS: ORAL_TABLET | ORAL | 0 refills | Status: DC

## 2018-01-26 MED ORDER — DOXYCYCLINE HYCLATE 100 MG PO TABS
ORAL_TABLET | ORAL | 0 refills | Status: DC
Start: 2018-01-26 — End: 2019-07-28

## 2018-01-26 MED ORDER — ESTRADIOL 2 MG PO TABS: 4.0000 mg | ORAL_TABLET | Freq: Every day | ORAL | 11 refills | Status: DC

## 2018-01-26 NOTE — Progress Notes (Signed)
Subjective:   Chief complaint: concern for STI's    Patient ID: Travis Palmer, adult    DOB: 02-24-1996, 22 y.o.   MRN: [id number]  HPI  [name]  returns for followup for HIV care. [name] on BIKTARVY and VL has been suppressed.  Lab Results  Component Value Date   HIV1RNAQUANT <20 DETECTED (A) 08/15/2017   HIV1RNAQUANT 173 (H) 04/29/2017   HIV1RNAQUANT 093,267 (H) 08/08/2016    Today she came to clinic after initially over sleeping for appt with me this am. She has been wanting to get hormone treatment since last week and states that [facility] had been sending Cassie requests for this. We asked her to come to clinic this afternoon.  She was sexually active this weekend and worried she could have contracted an STI      Past Medical History:  Diagnosis Date  . Epigastric pain 10/01/2017  . History of bipolar disorder   . HIV infection ([place])   . Housing problems 08/26/2016  . Syphilis 08/02/2015    Past Surgical History:  Procedure Laterality Date  . WISDOM TOOTH EXTRACTION  2014    Family History  Problem Relation Age of Onset  . Lactose intolerance Mother   . Cancer Maternal Aunt   . Hypertension Maternal Grandmother       Social History   Socioeconomic History  . Marital status: Single    Spouse name: Not on file  . Number of children: Not on file  . Years of education: Not on file  . Highest education level: Not on file  Social Needs  . Financial resource strain: Not on file  . Food insecurity - worry: Not on file  . Food insecurity - inability: Not on file  . Transportation needs - medical: Not on file  . Transportation needs - non-medical: Not on file  Occupational History  . Not on file  Tobacco Use  . Smoking status: Current Some Day Smoker    Packs/day: 0.00    Types: Cigarettes  . Smokeless tobacco: Never Used  Substance and Sexual Activity  . Alcohol use: Yes    Alcohol/week: 0.0 oz    Comment: weekend drinker  . Drug use: Yes   Frequency: 7.0 times per week    Types: Marijuana    Comment: every other day  . Sexual activity: Not Currently    Comment: given condoms  Other Topics Concern  . Not on file  Social History Narrative  . Not on file    Allergies  Allergen Reactions  . Depakote [Divalproex Sodium] Other (See Comments)    Hospitalized for 3 days for extreme GI upset because of this     Current Outpatient Medications:  .  amoxicillin (AMOXIL) 500 MG capsule, Take 2 capsules (1,000 mg total) by mouth 2 (two) times daily., Disp: 40 capsule, Rfl: 0 .  bictegravir-emtricitabine-tenofovir AF (BIKTARVY) 50-200-25 MG TABS tablet, Take 1 tablet daily by mouth., Disp: 30 tablet, Rfl: 5 .  diphenhydrAMINE (BENADRYL) 12.5 MG/5ML liquid, Take 12.5 mg by mouth 4 (four) times daily as needed for sleep., Disp: , Rfl:  .  DM-Doxylamine-Acetaminophen (NYQUIL COLD & FLU PO), Take 15 mLs by mouth every 8 (eight) hours as needed (cold symptoms). , Disp: , Rfl:  .  estradiol (ESTRACE) 2 MG tablet, Take 2 tablets (4 mg total) by mouth daily., Disp: 60 tablet, Rfl: 3 .  HYDROcodone-acetaminophen (NORCO/VICODIN) 5-325 MG tablet, Take 1-2 tablets by mouth every 6 hours as needed for pain  and/or cough., Disp: 11 tablet, Rfl: 0 .  oseltamivir (TAMIFLU) 75 MG capsule, Take 1 capsule (75 mg total) by mouth every 12 (twelve) hours., Disp: 10 capsule, Rfl: 0 .  spironolactone (ALDACTONE) 100 MG tablet, Take 1 tablet (100 mg total) by mouth daily., Disp: 30 tablet, Rfl: 3      Review of Systems  Constitutional: Negative for activity change, appetite change, chills, diaphoresis, fatigue, fever and unexpected weight change.  HENT: Negative for congestion, rhinorrhea, sinus pressure, sneezing, sore throat and trouble swallowing.   Eyes: Negative for photophobia and visual disturbance.  Respiratory: Negative for cough, chest tightness, shortness of breath, wheezing and stridor.   Cardiovascular: Negative for chest pain, palpitations  and leg swelling.  Gastrointestinal: Negative for abdominal distention, anal bleeding, blood in stool, diarrhea, nausea and vomiting.  Genitourinary: Negative for difficulty urinating, dysuria, flank pain and hematuria.  Musculoskeletal: Negative for arthralgias, back pain, gait problem, joint swelling and myalgias.  Skin: Negative for color change, pallor, rash and wound.  Neurological: Negative for dizziness, tremors, weakness and light-headedness.  Hematological: Negative for adenopathy. Does not bruise/bleed easily.  Psychiatric/Behavioral: Negative for agitation, behavioral problems, confusion, decreased concentration, dysphoric mood and sleep disturbance.       Objective:   Physical Exam  Constitutional: [name] oriented to person, place, and time. She appears well-developed and well-nourished.  HENT:  Head: Normocephalic and atraumatic.  Eyes: Conjunctivae and EOM are normal.  Neck: Normal range of motion. Neck supple.  Cardiovascular: Normal rate and regular rhythm.  Pulmonary/Chest: Effort normal. No respiratory distress. She has no wheezes.  Abdominal: Soft. Bowel sounds are normal. She exhibits no distension. There is no tenderness. There is no rebound and no guarding.  Musculoskeletal: Normal range of motion. She exhibits no edema or tenderness.  Neurological: [name] alert and oriented to person, place, and time.  Skin: Skin is warm and dry. No rash noted. No erythema. No pallor.  Psychiatric: She has a normal mood and affect. Her speech is normal. Judgment and thought content normal. Her mood appears not anxious. [name] not hyperactive. Cognition and memory are normal. She does not exhibit a depressed mood.  Nursing note and vitals reviewed.         Assessment & Plan:   HIV: continue BIKTARVY and renew HMAP today. She ONLY HAS FAMILY [employer] MEDICAID  Transgender: Continue aldactone and estradiol and I am ok to up dose per Minh's expertise  STI exposure: check RPR,  GC, chlamydia from urine , rectum and OP. Let her take 2 doses of doxy regardless as PEP based on [place] PrEP study. I also sent script of 60 doxy tablet so that in future she can use this as PEP for Chlamydia and syphilis  Syphilis: sp mx treatments. .NON-REACTIVE (09/07 1136)   I spent greater than 40 minutes with the patient including greater than 50% of time in face to face counsel of the patient re need to be highly adherent to ARV for her heatlh and in treatment as prevention scenario, re how we diagnose and prevent and treat STIs in counselling her not to believe vaccine conspiracy theories and on the importance of meningococcal and HPV vaccines  and in coordination of her care with [facility] pharmacy.

## 2018-01-26 NOTE — Progress Notes (Signed)
HPI: Travis Palmer is a 22 y.o. adult who is here to see Dr. Tommy Medal for her HIV visit  Allergies: Allergies  Allergen Reactions  . Depakote [Divalproex Sodium] Other (See Comments)    Hospitalized for 3 days for extreme GI upset because of this    Vitals: Temp: 98.8 F (37.1 C) (02/18 1406) Temp Source: Oral (02/18 1406) BP: 126/88 (02/18 1406) Pulse Rate: 64 (02/18 1406)  Past Medical History: Past Medical History:  Diagnosis Date  . Epigastric pain 10/01/2017  . History of bipolar disorder   . HIV infection ([place])   . Housing problems 08/26/2016  . Syphilis 08/02/2015    Social History: Social History   Socioeconomic History  . Marital status: Single    Spouse name: None  . Number of children: None  . Years of education: None  . Highest education level: None  Social Needs  . Financial resource strain: None  . Food insecurity - worry: None  . Food insecurity - inability: None  . Transportation needs - medical: None  . Transportation needs - non-medical: None  Occupational History  . None  Tobacco Use  . Smoking status: Current Some Day Smoker    Packs/day: 0.00    Types: Cigarettes  . Smokeless tobacco: Never Used  Substance and Sexual Activity  . Alcohol use: Yes    Alcohol/week: 0.0 oz    Comment: weekend drinker  . Drug use: Yes    Frequency: 7.0 times per week    Types: Marijuana    Comment: every other day  . Sexual activity: Not Currently    Comment: given condoms  Other Topics Concern  . None  Social History Narrative  . None    Previous Regimen: DTG/Descovy  Current Regimen: Biktarvy  Labs: HIV 1 RNA Quant (copies/mL)  Date Value  08/15/2017 <20 DETECTED (A)  04/29/2017 173 (H)  08/08/2016 419,379 (H)   HIV-1 RNA Viral Load (no units)  Date Value  11/06/2015 50  09/26/2015 175  07/19/2015 <40   CD4 (no units)  Date Value  07/19/2015 524  04/26/2015 427  03/07/2015 479   CD4 T Cell Abs (/uL)  Date Value  08/15/2017 450   04/29/2017 520  02/04/2017 300 (L)   Hep B S Ab (no units)  Date Value  12/07/2014 NEG   Hepatitis B Surface Ag (no units)  Date Value  01/09/2015 NEGATIVE   HCV Ab (no units)  Date Value  01/31/2015 NEGATIVE    CrCl: CrCl cannot be calculated (Patient's most recent lab result is older than the maximum 21 days allowed.).  Lipids:    Component Value Date/Time   CHOL 92 01/31/2015 1653   TRIG 90 01/31/2015 1653   HDL 28 (L) 01/31/2015 1653   CHOLHDL 3.3 01/31/2015 1653   VLDL 18 01/31/2015 1653   LDLCALC 46 01/31/2015 1653    Assessment: Travis Palmer is doing very well on her Biktarvy. She would like to see more effect from her estrogen. [name] currently at 4mg  with her spironolactone. The max would be 6mg /day due to the risk for liver toxicity and thrombosis. She inquired about delestrogen injection. It is on the ADAP formulary. Explained to her that it would be an IM shot. After some pause, she would like to continue oral estradiol for now and think about injectable estrogen in the future. Sent in the new Rx to [facility]. She will meet with [name] to renew ADAP.   Recommendations:  Increase estrogen to 4mg  qAM  and 2mg  qPM Continue spironolactone 100mg  PO qday Continue Biktarvy 1 PO qday  Onnie Boer, PharmD, BCPS, AAHIVP, CPP Clinical Infectious [facility] 01/26/2018, 2:55 PM

## 2018-01-27 LAB — CBC WITH DIFFERENTIAL/PLATELET
BASOS PCT: 1 %
Basophils Absolute: 41 cells/uL (ref 0–200)
EOS PCT: 1.2 %
Eosinophils Absolute: 49 cells/uL (ref 15–500)
HCT: 37.1 % — ABNORMAL LOW (ref 38.5–50.0)
HEMOGLOBIN: 12.9 g/dL — AB (ref 13.2–17.1)
Lymphs Abs: 1661 cells/uL (ref 850–3900)
MCH: 33.5 pg — AB (ref 27.0–33.0)
MCHC: 34.8 g/dL (ref 32.0–36.0)
MCV: 96.4 fL (ref 80.0–100.0)
MONOS PCT: 8.5 %
MPV: 9.1 fL (ref 7.5–12.5)
NEUTROS ABS: 2001 {cells}/uL (ref 1500–7800)
Neutrophils Relative %: 48.8 %
Platelets: 336 10*3/uL (ref 140–400)
RBC: 3.85 10*6/uL — ABNORMAL LOW (ref 4.20–5.80)
RDW: 13 % (ref 11.0–15.0)
TOTAL LYMPHOCYTE: 40.5 %
WBC mixed population: 349 cells/uL (ref 200–950)
WBC: 4.1 10*3/uL (ref 3.8–10.8)

## 2018-01-27 LAB — COMPLETE METABOLIC PANEL WITH GFR
AG RATIO: 1.9 (calc) (ref 1.0–2.5)
ALKALINE PHOSPHATASE (APISO): 39 U/L — AB (ref 40–115)
ALT: 9 U/L (ref 9–46)
AST: 13 U/L (ref 10–40)
Albumin: 4.6 g/dL (ref 3.6–5.1)
BUN: 14 mg/dL (ref 7–25)
CHLORIDE: 103 mmol/L (ref 98–110)
CO2: 25 mmol/L (ref 20–32)
Calcium: 9.3 mg/dL (ref 8.6–10.3)
Creat: 0.81 mg/dL (ref 0.60–1.35)
GFR, Est African American: 145 mL/min/{1.73_m2} (ref >=60)
GFR, Est Non African American: 125 mL/min/{1.73_m2} (ref >=60)
GLOBULIN: 2.4 g/dL (ref 1.9–3.7)
Glucose, Bld: 90 mg/dL (ref 65–99)
Potassium: 4.2 mmol/L (ref 3.5–5.3)
SODIUM: 137 mmol/L (ref 135–146)
Total Bilirubin: 1 mg/dL (ref 0.2–1.2)
Total Protein: 7 g/dL (ref 6.1–8.1)

## 2018-01-27 LAB — URINE CYTOLOGY ANCILLARY ONLY
Chlamydia: NEGATIVE
Neisseria Gonorrhea: NEGATIVE

## 2018-01-27 LAB — LIPID PANEL
CHOLESTEROL: 131 mg/dL (ref <200)
HDL: 56 mg/dL (ref >40)
LDL CHOLESTEROL (CALC): 61 mg/dL
Non-HDL Cholesterol (Calc): 75 mg/dL (calc) (ref <130)
Total CHOL/HDL Ratio: 2.3 (calc) (ref <5.0)
Triglycerides: 61 mg/dL (ref <150)

## 2018-01-27 LAB — CYTOLOGY, (ORAL, ANAL, URETHRAL) ANCILLARY ONLY
CHLAMYDIA, DNA PROBE: NEGATIVE
NEISSERIA GONORRHEA: NEGATIVE

## 2018-01-27 LAB — T-HELPER CELL (CD4) - (RCID CLINIC ONLY)
CD4 T CELL HELPER: 28 % — AB (ref 33–55)
CD4 T Cell Abs: 520 /uL (ref 400–2700)

## 2018-01-27 LAB — RPR: RPR: NONREACTIVE

## 2018-01-28 ENCOUNTER — Telehealth: Payer: Self-pay | Admitting: *Deleted

## 2018-01-28 LAB — CYTOLOGY, (ORAL, ANAL, URETHRAL) ANCILLARY ONLY
Chlamydia: NEGATIVE
NEISSERIA GONORRHEA: POSITIVE — AB

## 2018-01-28 NOTE — Telephone Encounter (Signed)
-----   Message from Truman Hayward, MD sent at 01/28/2018 11:02 AM EST ----- Unfortunately Travis Palmer has Gonorrhea. She was not symptomatic for an STI at the time and not with known contact so I did not "just treat her for everything" she will need to come back for Ceftriaxone 250 mg once a dose of Azithromycin. She can try the doxy post exposure stuff in futrue which can work for syphilis and chlamydia but not for [facility] unfortunately. Partners need to be treated, tested

## 2018-01-28 NOTE — Telephone Encounter (Signed)
Left generic message asking for a call back to his doctor's office for lab test results. [name], RN

## 2018-01-28 NOTE — Telephone Encounter (Signed)
Patient returned call. RN relayed results, scheduled her for 2/21 at 10:30. [name], RN

## 2018-01-29 ENCOUNTER — Ambulatory Visit (INDEPENDENT_AMBULATORY_CARE_PROVIDER_SITE_OTHER): Payer: Self-pay | Admitting: Behavioral Health

## 2018-01-29 ENCOUNTER — Encounter: Payer: Self-pay | Admitting: Infectious Disease

## 2018-01-29 DIAGNOSIS — A549 Gonococcal infection, unspecified: Secondary | ICD-10-CM

## 2018-01-29 DIAGNOSIS — A64 Unspecified sexually transmitted disease: Secondary | ICD-10-CM

## 2018-01-29 LAB — HIV-1 RNA QUANT-NO REFLEX-BLD
HIV 1 RNA QUANT: DETECTED {copies}/mL — AB
HIV-1 RNA QUANT, LOG: DETECTED {Log_copies}/mL — AB

## 2018-01-29 MED ORDER — AZITHROMYCIN 250 MG PO TABS
1000.0000 mg | ORAL_TABLET | Freq: Once | ORAL | Status: AC
  Administered 2018-01-29: 1000 mg via ORAL

## 2018-01-29 MED ORDER — CEFTRIAXONE SODIUM 250 MG IJ SOLR
250.0000 mg | Freq: Once | INTRAMUSCULAR | Status: AC
  Administered 2018-01-29: 250 mg via INTRAMUSCULAR

## 2018-01-29 NOTE — Telephone Encounter (Signed)
Ceftriaxone IM and yes 1 gram azithromycin orally

## 2018-01-29 NOTE — Progress Notes (Signed)
Patient tolerated injection and oral medication.  Patient was given condoms

## 2018-02-09 ENCOUNTER — Emergency Department (HOSPITAL_COMMUNITY): Payer: Self-pay

## 2018-02-09 ENCOUNTER — Emergency Department (HOSPITAL_COMMUNITY)
Admission: EM | Admit: 2018-02-09 | Discharge: 2018-02-09 | Discharge status: Against Medical Advice | Payer: Self-pay | Attending: Emergency Medicine | Admitting: Emergency Medicine

## 2018-02-09 ENCOUNTER — Other Ambulatory Visit: Payer: Self-pay

## 2018-02-09 ENCOUNTER — Encounter (HOSPITAL_COMMUNITY): Payer: Self-pay | Admitting: Emergency Medicine

## 2018-02-09 DIAGNOSIS — R1011 Right upper quadrant pain: Secondary | ICD-10-CM | POA: Insufficient documentation

## 2018-02-09 DIAGNOSIS — R110 Nausea: Secondary | ICD-10-CM | POA: Insufficient documentation

## 2018-02-09 DIAGNOSIS — F17210 Nicotine dependence, cigarettes, uncomplicated: Secondary | ICD-10-CM | POA: Insufficient documentation

## 2018-02-09 DIAGNOSIS — Z79899 Other long term (current) drug therapy: Secondary | ICD-10-CM | POA: Insufficient documentation

## 2018-02-09 DIAGNOSIS — Z5329 Procedure and treatment not carried out because of patient's decision for other reasons: Secondary | ICD-10-CM | POA: Insufficient documentation

## 2018-02-09 DIAGNOSIS — Z21 Asymptomatic human immunodeficiency virus [HIV] infection status: Secondary | ICD-10-CM | POA: Insufficient documentation

## 2018-02-09 DIAGNOSIS — R109 Unspecified abdominal pain: Secondary | ICD-10-CM

## 2018-02-09 LAB — COMPREHENSIVE METABOLIC PANEL WITH GFR
ALT: 53 U/L (ref 17–63)
AST: 125 U/L — ABNORMAL HIGH (ref 15–41)
Albumin: 4 g/dL (ref 3.5–5.0)
Alkaline Phosphatase: 36 U/L — ABNORMAL LOW (ref 38–126)
Anion gap: 6 (ref 5–15)
BUN: 14 mg/dL (ref 6–20)
CO2: 26 mmol/L (ref 22–32)
Calcium: 8.7 mg/dL — ABNORMAL LOW (ref 8.9–10.3)
Chloride: 107 mmol/L (ref 101–111)
Creatinine, Ser: 0.82 mg/dL (ref 0.61–1.24)
GFR calc Af Amer: >60 mL/min (ref >60)
GFR calc non Af Amer: >60 mL/min (ref >60)
Glucose, Bld: 109 mg/dL — ABNORMAL HIGH (ref 65–99)
Potassium: 3.4 mmol/L — ABNORMAL LOW (ref 3.5–5.1)
Sodium: 139 mmol/L (ref 135–145)
Total Bilirubin: 0.7 mg/dL (ref 0.3–1.2)
Total Protein: 6.9 g/dL (ref 6.5–8.1)

## 2018-02-09 LAB — CBC
HEMATOCRIT: 36.6 % — AB (ref 39.0–52.0)
HEMOGLOBIN: 12.5 g/dL — AB (ref 13.0–17.0)
MCH: 33.2 pg (ref 26.0–34.0)
MCHC: 34.2 g/dL (ref 30.0–36.0)
MCV: 97.1 fL (ref 78.0–100.0)
Platelets: 284 10*3/uL (ref 150–400)
RBC: 3.77 MIL/uL — ABNORMAL LOW (ref 4.22–5.81)
RDW: 12.8 % (ref 11.5–15.5)
WBC: 4.4 10*3/uL (ref 4.0–10.5)

## 2018-02-09 LAB — URINALYSIS, ROUTINE W REFLEX MICROSCOPIC
Bilirubin Urine: NEGATIVE
Glucose, UA: NEGATIVE mg/dL
Hgb urine dipstick: NEGATIVE
Ketones, ur: NEGATIVE mg/dL
Leukocytes, UA: NEGATIVE
NITRITE: NEGATIVE
PH: 6 (ref 5.0–8.0)
Protein, ur: NEGATIVE mg/dL
SPECIFIC GRAVITY, URINE: 1.026 (ref 1.005–1.030)

## 2018-02-09 LAB — RAPID URINE DRUG SCREEN, HOSP PERFORMED
Amphetamines: NOT DETECTED
Barbiturates: NOT DETECTED
Benzodiazepines: NOT DETECTED
Cocaine: NOT DETECTED
Opiates: NOT DETECTED
Tetrahydrocannabinol: POSITIVE — AB

## 2018-02-09 LAB — LIPASE, BLOOD: Lipase: 23 U/L (ref 11–51)

## 2018-02-09 MED ORDER — KETOROLAC TROMETHAMINE 30 MG/ML IJ SOLN
30.0000 mg | Freq: Once | INTRAMUSCULAR | Status: AC
  Administered 2018-02-09: 30 mg via INTRAVENOUS
  Filled 2018-02-09: qty 1

## 2018-02-09 MED ORDER — ONDANSETRON HCL 4 MG/2ML IJ SOLN
4.0000 mg | Freq: Once | INTRAMUSCULAR | Status: AC
  Administered 2018-02-09: 4 mg via INTRAVENOUS
  Filled 2018-02-09: qty 2

## 2018-02-09 MED ORDER — SODIUM CHLORIDE 0.9 % IV BOLUS (SEPSIS)
1000.0000 mL | Freq: Once | INTRAVENOUS | Status: AC
  Administered 2018-02-09: 1000 mL via INTRAVENOUS

## 2018-02-09 NOTE — ED Provider Notes (Signed)
[facility] DEPT Provider Note   CSN: [id number] Arrival date & time: 02/09/18  0534     History   Chief Complaint Chief Complaint  Patient presents with  . Abdominal Pain    HPI Travis Palmer is a 22 y.o. adult past medical history of HIV, syphilis, cholelithiasis who presents for evaluation of abdominal pain that began approximately 1 hour prior to ED arrival.  Patient reports that pain is generalized but states it is more intense in the right upper quadrant and epigastric region.  He describes it as a "constant throbbing pain." He denies any alleviating or aggravating factors.  He is not taking any medications for the pain.  He does report some nausea but denies any vomiting.  Patient states that his last bowel movement was yesterday and was normal.  He states that he does have some issues with constipation.  Patient reports that he has a history of pancreatitis but states that he has not drink any alcohol.  He did eat some stir fry last night prior to onset of symptoms.  Patient denies any fever, chest pain, difficulty breathing, dysuria, hematuria.  Patient reports that he smoked marijuana yesterday.  Denies any other drugs.  The history is provided by the patient.    Past Medical History:  Diagnosis Date  . Epigastric pain 10/01/2017  . History of bipolar disorder   . HIV infection ([place])   . Housing problems 08/26/2016  . Routine screening for STI (sexually transmitted infection) 01/26/2018  . Syphilis 08/02/2015    Patient Active Problem List   Diagnosis Date Noted  . Routine screening for STI (sexually transmitted infection) 01/26/2018  . Epigastric pain 10/01/2017  . 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 Chlamydia 05/07/2017  . Housing problems 08/26/2016  . Syphilis 08/02/2015  . HIV disease ([place]) 01/09/2015  . 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 Cigarette smoker 01/09/2015    Past Surgical History:  Procedure Laterality Date  . [place]  EXTRACTION  2014       Home Medications    Prior to Admission medications   Medication Sig Start Date End Date Taking? Authorizing Provider  bictegravir-emtricitabine-tenofovir AF (BIKTARVY) 50-200-25 MG TABS tablet Take 1 tablet by mouth daily. 01/26/18  Yes Tommy Medal, Lavell Islam, MD  doxycycline (VIBRA-TABS) 100 MG tablet Take two tablets within 24-73 hours of sex to prevent STI 01/26/18  Yes Tommy Medal, Lavell Islam, MD  estradiol (ESTRACE) 2 MG tablet Take 2 tablets in the morning and 1 at night Patient taking differently: Take 2-4 mg by mouth 2 (two) times daily. Take 2 tablets in the morning and 1 at night 01/26/18  Yes Tommy Medal, Lavell Islam, MD  ibuprofen (ADVIL,MOTRIN) 200 MG tablet Take 600-800 mg by mouth every 6 (six) hours as needed (For pain.).   Yes [provider]  spironolactone (ALDACTONE) 100 MG tablet Take 1 tablet (100 mg total) by mouth daily. 01/26/18  Yes Tommy Medal, Lavell Islam, MD    Family History Family History  Problem Relation Age of Onset  . Lactose intolerance Mother   . Cancer Maternal Aunt   . Hypertension Maternal Grandmother     Social History Social History   Tobacco Use  . Smoking status: Current Some Day Smoker    Packs/day: 0.00    Types: Cigarettes  . Smokeless tobacco: Never Used  Substance Use Topics  . Alcohol use: Yes    Alcohol/week: 0.0 oz    Comment: weekend drinker  . Drug  use: Yes    Frequency: 7.0 times per week    Types: Marijuana    Comment: every other day     Allergies   Apple and Depakote [divalproex sodium]   Review of Systems Review of Systems  Constitutional: Negative for chills and fever.  Respiratory: Negative for shortness of breath.   Cardiovascular: Negative for chest pain.  Gastrointestinal: Positive for abdominal pain and nausea. Negative for diarrhea and vomiting.  Genitourinary: Negative for dysuria and hematuria.  Musculoskeletal: Negative for back pain and neck pain.  Skin: Negative for rash.    Neurological: Negative for dizziness, weakness, numbness and headaches.  All other systems reviewed and are negative.    Physical Exam Updated Vital Signs BP 123/66 (BP Location: Left Arm)   Pulse 64   Temp 98.4 F (36.9 C) (Oral)   Resp 18   Ht _0  (1.702 m)   Wt 75.8 kg (167 lb)   SpO2 96%   BMI 26.16 kg/m   Physical Exam  Constitutional: [name] oriented to person, place, and time. She appears well-developed and well-nourished.  Appears uncomfortable but no acute distress   HENT:  Head: Normocephalic and atraumatic.  Mouth/Throat: Oropharynx is clear and moist and mucous membranes are normal.  Eyes: Conjunctivae, EOM and lids are normal. Pupils are equal, round, and reactive to light.  Neck: Full passive range of motion without pain.  Cardiovascular: Normal rate, regular rhythm, normal heart sounds and normal pulses. Exam reveals no gallop and no friction rub.  No murmur heard. Pulmonary/Chest: Effort normal and breath sounds normal.  Abdominal: Soft. Normal appearance. There is tenderness in the right upper quadrant and epigastric area. There is no rigidity, no guarding and no CVA tenderness.  Tenderness to palpation to the RUQ and epigastric region. No rigidity, no guarding. No CVA tenderness bilaterally.   Musculoskeletal: Normal range of motion.  Neurological: [name] alert and oriented to person, place, and time.  Skin: Skin is warm and dry. Capillary refill takes less than 2 seconds.  Psychiatric: She has a normal mood and affect. Her speech is normal.  Nursing note and vitals reviewed.    ED Treatments / Results  Labs (all labs ordered are listed, but only abnormal results are displayed) Labs Reviewed  COMPREHENSIVE METABOLIC PANEL - Abnormal; Notable for the following components:      Result Value   Potassium 3.4 (*)    Glucose, Bld 109 (*)    Calcium 8.7 (*)    AST 125 (*)    Alkaline Phosphatase 36 (*)    All other components within normal limits  CBC  - Abnormal; Notable for the following components:   RBC 3.77 (*)    Hemoglobin 12.5 (*)    HCT 36.6 (*)    All other components within normal limits  RAPID URINE DRUG SCREEN, HOSP PERFORMED - Abnormal; Notable for the following components:   Tetrahydrocannabinol POSITIVE (*)    All other components within normal limits  LIPASE, BLOOD  URINALYSIS, ROUTINE W REFLEX MICROSCOPIC    EKG  EKG Interpretation None       Radiology US Abdomen Limited Ruq  Result Date: 02/09/2018 CLINICAL DATA:  Abdominal pain EXAM: ULTRASOUND ABDOMEN LIMITED RIGHT UPPER QUADRANT COMPARISON:  07/24/2016 FINDINGS: Gallbladder: Gallbladder is well distended with multiple gallstones within. No gallbladder wall thickening or pericholecystic fluid is noted. Common bile duct: Diameter: 9.5  mm Liver: No focal lesion identified. Within normal limits in parenchymal echogenicity. Portal vein is patent on  color Doppler imaging with normal direction of blood flow towards the liver. IMPRESSION: Cholelithiasis without evidence of cholecystitis. Prominent common bile duct. The possibility of a distal common bile duct stone cannot be excluded. Correlation with laboratory values is recommended. Electronically Signed   By: Inez Catalina M.D.   On: 02/09/2018 08:43    Procedures Procedures (including critical care time)  Medications Ordered in ED Medications  sodium chloride 0.9 % bolus 1,000 mL (0 mLs Intravenous Stopped 02/09/18 0936)  ondansetron (ZOFRAN) injection 4 mg (4 mg Intravenous Given 02/09/18 0805)  ketorolac (TORADOL) 30 MG/ML injection 30 mg (30 mg Intravenous Given 02/09/18 0804)     Initial Impression / Assessment and Plan / ED Course  I have reviewed the triage vital signs and the nursing notes.  Pertinent labs & imaging results that were available during my care of the patient were reviewed by me and considered in my medical decision making (see chart for details).     22 y.o. transgender F who presents for  evaluation of abdominal pain that began tonight.  Associated nausea.  No fever, vomiting or dysuria.  She does have a history of pancreatitis that he states is from gallstones.  Patient is afebrile, non-toxic appearing, sitting comfortably on examination table. Vital signs reviewed and stable.  On exam, patient does have tenderness to the epigastric and right upper quadrant region.  Consider cholecystitis versus viral GI versus hyperemesis cannabid.  Labs ordered at triage.  Lipase unremarkable.  CBC shows slight anemia.  CMP shows slight hypokalemia.  AST is 125 and alk phos is 36.  There is a slight bump from his previous CMP 2 weeks ago.  We will plan for right upper quadrant sound evaluation.  Right upper quadrant ultrasound shows gallbladder is distended with multiple gallstones.  No gallbladder wall thickening, pericholecystic fluid noted.  Common bile duct is 9.5 mm. Plan for GI consult.  I discussed results with patient.  He reports some improvement in pain after analgesics.  He has not had any vomiting since being in the ED.  I discussed with him the need to consult GI regarding his findings.  Patient reports wanting to leave stating that he is "hungry and that his pain feels better and that he does not want to be here anymore."  Patient states "I know I am being moody but is I do not feel like staying."  Expressed to him regarding concerns regarding laboratory findings and need for GI consult.  Patient is willing to wait for GI consult.  Discussed patient with Fabio Asa, PA-C ([facility]). Recommends obtaining a MRCP for further evaluation. If negative, can potentially be d/c home with outpatient follow-up. If positive, would likely need admission for ERCP.   Updated patient on plan.  Patient does not wish to stay.  Patient would like to leave as he does not want the MRCP done and does not wish to be admitted.  Patient reports improvement in pain.  Vital signs are stable.  He is tolerating water in  the department without any difficulty.  I discussed with him regarding the importance of staying in obtaining MRCP for evaluation of possible common bile duct stone.  I discussed at length with patient regarding the risks of leaving AMA, including but not limited to worsening condition, infection, death.  Patient expresses full understanding of risks of leaving and still wishes to leave without completing treatment.  Patient exhibits full medical decision-making capacity and does not appear intoxicated.  Instructed patient to  follow-up with GI regarding his findings.  Friend came to remove IV.  I gave patient an additional chance to reconsider his options and plan for further evaluation with the MRCP and possible admission.  Patient again expresses his wish to leave [place] in front of me and RN does not wish for any further treatment.  Patient discharged AMA.  Final Clinical Impressions(s) / ED Diagnoses   Final diagnoses:  Abdominal pain    ED Discharge Orders    None       Volanda Napoleon, PA-C 02/09/18 1711    Gareth Morgan, MD 02/09/18 2319

## 2018-02-09 NOTE — Discharge Instructions (Signed)
You need to follow-up with the referred GI doctor for further evaluation.  Return to the Emergency Department for any worsening pain, fever, vomiting, or any other worsening or concerning symptoms.

## 2018-02-09 NOTE — ED Triage Notes (Signed)
Patient is complaining of abdominal pain all over. It started an hour ago. Patient has tenderness in all four quadrant. Patient tried to poop but could not. Patient has a hx of pancreatitis.

## 2018-02-10 ENCOUNTER — Inpatient Hospital Stay (HOSPITAL_COMMUNITY)
Admission: EM | Admit: 2018-02-10 | Discharge: 2018-02-10 | DRG: 445 | Discharge status: Against Medical Advice | Payer: Self-pay | Attending: Family Medicine | Admitting: Family Medicine

## 2018-02-10 ENCOUNTER — Other Ambulatory Visit: Payer: Self-pay

## 2018-02-10 ENCOUNTER — Emergency Department (HOSPITAL_COMMUNITY): Payer: Self-pay

## 2018-02-10 ENCOUNTER — Encounter (HOSPITAL_COMMUNITY): Payer: Self-pay | Admitting: Emergency Medicine

## 2018-02-10 ENCOUNTER — Encounter: Payer: Self-pay | Admitting: Internal Medicine

## 2018-02-10 DIAGNOSIS — F640 Transsexualism: Secondary | ICD-10-CM

## 2018-02-10 DIAGNOSIS — F17210 Nicotine dependence, cigarettes, uncomplicated: Secondary | ICD-10-CM | POA: Diagnosis present

## 2018-02-10 DIAGNOSIS — K8070 Calculus of gallbladder and bile duct without cholecystitis without obstruction: Principal | ICD-10-CM | POA: Diagnosis present

## 2018-02-10 DIAGNOSIS — R1013 Epigastric pain: Secondary | ICD-10-CM

## 2018-02-10 DIAGNOSIS — F649 Gender identity disorder, unspecified: Secondary | ICD-10-CM | POA: Diagnosis present

## 2018-02-10 DIAGNOSIS — Z79899 Other long term (current) drug therapy: Secondary | ICD-10-CM

## 2018-02-10 DIAGNOSIS — R109 Unspecified abdominal pain: Secondary | ICD-10-CM

## 2018-02-10 DIAGNOSIS — Z789 Other specified health status: Secondary | ICD-10-CM | POA: Diagnosis present

## 2018-02-10 DIAGNOSIS — F319 Bipolar disorder, unspecified: Secondary | ICD-10-CM | POA: Diagnosis present

## 2018-02-10 DIAGNOSIS — B20 Human immunodeficiency virus [HIV] disease: Secondary | ICD-10-CM

## 2018-02-10 DIAGNOSIS — K8050 Calculus of bile duct without cholangitis or cholecystitis without obstruction: Secondary | ICD-10-CM

## 2018-02-10 DIAGNOSIS — K838 Other specified diseases of biliary tract: Secondary | ICD-10-CM

## 2018-02-10 LAB — COMPREHENSIVE METABOLIC PANEL
ALK PHOS: 69 U/L (ref 38–126)
ALT: 665 U/L — AB (ref 17–63)
ANION GAP: 7 (ref 5–15)
AST: 634 U/L — ABNORMAL HIGH (ref 15–41)
Albumin: 3.8 g/dL (ref 3.5–5.0)
BUN: 9 mg/dL (ref 6–20)
CALCIUM: 8.4 mg/dL — AB (ref 8.9–10.3)
CO2: 24 mmol/L (ref 22–32)
CREATININE: 0.83 mg/dL (ref 0.61–1.24)
Chloride: 107 mmol/L (ref 101–111)
GFR calc non Af Amer: >60 mL/min (ref >60)
GLUCOSE: 102 mg/dL — AB (ref 65–99)
Potassium: 3.8 mmol/L (ref 3.5–5.1)
Sodium: 138 mmol/L (ref 135–145)
Total Bilirubin: 4.1 mg/dL — ABNORMAL HIGH (ref 0.3–1.2)
Total Protein: 6.6 g/dL (ref 6.5–8.1)

## 2018-02-10 LAB — CBC
HCT: 35.6 % — ABNORMAL LOW (ref 39.0–52.0)
HEMOGLOBIN: 12.4 g/dL — AB (ref 13.0–17.0)
MCH: 33.2 pg (ref 26.0–34.0)
MCHC: 34.8 g/dL (ref 30.0–36.0)
MCV: 95.4 fL (ref 78.0–100.0)
PLATELETS: 265 10*3/uL (ref 150–400)
RBC: 3.73 MIL/uL — AB (ref 4.22–5.81)
RDW: 12.9 % (ref 11.5–15.5)
WBC: 3.2 10*3/uL — ABNORMAL LOW (ref 4.0–10.5)

## 2018-02-10 LAB — URINALYSIS, ROUTINE W REFLEX MICROSCOPIC
Glucose, UA: NEGATIVE mg/dL
HGB URINE DIPSTICK: NEGATIVE
KETONES UR: NEGATIVE mg/dL
Leukocytes, UA: NEGATIVE
NITRITE: NEGATIVE
PROTEIN: NEGATIVE mg/dL
SPECIFIC GRAVITY, URINE: 1.029 (ref 1.005–1.030)
pH: 6 (ref 5.0–8.0)

## 2018-02-10 LAB — ACETAMINOPHEN LEVEL

## 2018-02-10 LAB — LIPASE, BLOOD: Lipase: 21 U/L (ref 11–51)

## 2018-02-10 MED ORDER — ONDANSETRON HCL 4 MG PO TABS: 4.0000 mg | ORAL_TABLET | Freq: Four times a day (QID) | ORAL | Status: DC | PRN

## 2018-02-10 MED ORDER — ESTRADIOL 1 MG PO TABS: 2.0000 mg | ORAL_TABLET | Freq: Two times a day (BID) | ORAL | Status: DC

## 2018-02-10 MED ORDER — ONDANSETRON HCL 4 MG/2ML IJ SOLN: 4.0000 mg | Freq: Once | INTRAMUSCULAR | Status: DC

## 2018-02-10 MED ORDER — SODIUM CHLORIDE 0.9 % IV BOLUS (SEPSIS)
1000.0000 mL | Freq: Once | INTRAVENOUS | Status: AC
  Administered 2018-02-10: 1000 mL via INTRAVENOUS

## 2018-02-10 MED ORDER — BICTEGRAVIR-EMTRICITAB-TENOFOV 50-200-25 MG PO TABS: 1.0000 | ORAL_TABLET | Freq: Every day | ORAL | Status: DC

## 2018-02-10 MED ORDER — SODIUM CHLORIDE 0.9 % IV SOLN: INTRAVENOUS | Status: DC

## 2018-02-10 MED ORDER — GADOBENATE DIMEGLUMINE 529 MG/ML IV SOLN: 15.0000 mL | Freq: Once | INTRAVENOUS | Status: DC | PRN

## 2018-02-10 MED ORDER — ONDANSETRON 4 MG PO TBDP: 4.0000 mg | ORAL_TABLET | Freq: Once | ORAL | Status: DC | PRN

## 2018-02-10 MED ORDER — ONDANSETRON HCL 4 MG/2ML IJ SOLN
4.0000 mg | Freq: Once | INTRAMUSCULAR | Status: AC
  Administered 2018-02-10: 4 mg via INTRAVENOUS
  Filled 2018-02-10: qty 2

## 2018-02-10 MED ORDER — FENTANYL CITRATE (PF) 100 MCG/2ML IJ SOLN
100.0000 ug | Freq: Once | INTRAMUSCULAR | Status: AC
  Administered 2018-02-10: 100 ug via INTRAVENOUS
  Filled 2018-02-10: qty 2

## 2018-02-10 MED ORDER — ONDANSETRON HCL 4 MG/2ML IJ SOLN
4.0000 mg | Freq: Four times a day (QID) | INTRAMUSCULAR | Status: DC | PRN
  Administered 2018-02-10: 4 mg via INTRAVENOUS
  Filled 2018-02-10: qty 2

## 2018-02-10 MED ORDER — MORPHINE SULFATE (PF) 4 MG/ML IV SOLN: 4.0000 mg | Freq: Once | INTRAVENOUS | Status: DC

## 2018-02-10 MED ORDER — FENTANYL CITRATE (PF) 100 MCG/2ML IJ SOLN: 50.0000 ug | INTRAMUSCULAR | Status: DC | PRN

## 2018-02-10 MED ORDER — FENTANYL CITRATE (PF) 100 MCG/2ML IJ SOLN
100.0000 ug | Freq: Once | INTRAMUSCULAR | Status: DC
  Filled 2018-02-10: qty 2

## 2018-02-10 MED ORDER — IBUPROFEN 200 MG PO TABS
600.0000 mg | ORAL_TABLET | Freq: Four times a day (QID) | ORAL | Status: DC | PRN
Start: 2018-02-10 — End: 2018-02-10

## 2018-02-10 NOTE — H&P (View-Only) (Signed)
   Consultation  Referring Provider:  Dr. Hammond     Primary Care Physician:  Van Dam, Cornelius N, MD Primary Gastroenterologist:  Unassigned       Reason for Consultation:  Elevated LFT's, Dilated CBD, Abdominal Pain            HPI:   Travis Palmer  "Travis Palmer" is a 21 y.o. adult with past medical history of HIV, pancreatitis and others listed below, who presented to the ER initially yesterday 02/09/18 with epigastric pain, but left AMA before testing was complete and returned to the ER this morning with continued abdominal pain and nausea.    Today, explains started with severe epigastric/right upper quadrant pain yesterday and came to the ER, felt better after pain medications and antiemetics and left AMA before MRCP.  Pain returned around 3:00 in the morning and patient came back to the ER.  Does describe history of 3 separate hospital visits for "pancreatitis".  Apparently, was told to have her gallbladder out on all 3 occasions but "did not want this done then".  Now with a 9-10/10 pain worse in her right upper quadrant, but also complaining of left lower quadrant pain.  Also with nausea which is better after antiemetics in the ER.    Admits to marijuana use but denies supplementation or other drugs of abuse.  On estrogen daily.    Denies fever, chills, weight loss, anorexia or change in bowel habits.  ER course: AST 634 (125 yesterday), ALT 665 (53 yesterday), alk phos 69 (36 yesterday), total bilirubin 4.1 (0.7 yesterday), ultrasound 02/09/18 with common bile duct at 9.5 cm and cholelithiasis without evidence of cholecystitis   Past Medical History:  Diagnosis Date  . Epigastric pain 10/01/2017  . History of bipolar disorder   . HIV infection ([facility])   . Housing problems 08/26/2016  . Routine screening for STI (sexually transmitted infection) 01/26/2018  . Syphilis 08/02/2015    Past Surgical History:  Procedure Laterality Date  . [place] TO[place] EXTRACTION  2014    Family History    Problem Relation Age of Onset  . Lactose intolerance Mother   . Cancer Maternal Aunt   . Hypertension Maternal Grandmother      Social History   Tobacco Use  . Smoking status: Current Some Day Smoker    Packs/day: 0.00    Types: Cigarettes  . Smokeless tobacco: Never Used  Substance Use Topics  . Alcohol use: Yes    Alcohol/week: 0.0 oz    Comment: weekend drinker  . Drug use: Yes    Frequency: 7.0 times per week    Types: Marijuana    Comment: every other day    Prior to Admission medications   Medication Sig Start Date End Date Taking? Authorizing Provider  bictegravir-emtricitabine-tenofovir AF (BIKTARVY) 50-200-25 MG TABS tablet Take 1 tablet by mouth daily. 01/26/18  Yes Van Dam, Cornelius N, MD  doxycycline (VIBRA-TABS) 100 MG tablet Take two tablets within 24-73 hours of sex to prevent STI 01/26/18  Yes Van Dam, Cornelius N, MD  estradiol (ESTRACE) 2 MG tablet Take 2 tablets in the morning and 1 at night Patient taking differently: Take 2-4 mg by mouth 2 (two) times daily. Take 2 tablets in the morning and 1 at night 01/26/18  Yes Van Dam, Cornelius N, MD  ibuprofen (ADVIL,MOTRIN) 200 MG tablet Take 600-800 mg by mouth every 6 (six) hours as needed (For pain.).   Yes [provider]  spironolactone (ALDACTONE) 100 MG   tablet Take 1 tablet (100 mg total) by mouth daily. 01/26/18  Yes Van Dam, Cornelius N, MD    No current facility-administered medications for this encounter.    Current Outpatient Medications  Medication Sig Dispense Refill  . bictegravir-emtricitabine-tenofovir AF (BIKTARVY) 50-200-25 MG TABS tablet Take 1 tablet by mouth daily. 30 tablet 11  . doxycycline (VIBRA-TABS) 100 MG tablet Take two tablets within 24-73 hours of sex to prevent STI 60 tablet 0  . estradiol (ESTRACE) 2 MG tablet Take 2 tablets in the morning and 1 at night (Patient taking differently: Take 2-4 mg by mouth 2 (two) times daily. Take 2 tablets in the morning and 1 at night) 90  tablet 11  . ibuprofen (ADVIL,MOTRIN) 200 MG tablet Take 600-800 mg by mouth every 6 (six) hours as needed (For pain.).    . spironolactone (ALDACTONE) 100 MG tablet Take 1 tablet (100 mg total) by mouth daily. 30 tablet 11    Allergies as of 02/10/2018 - Review Complete 02/10/2018  Allergen Reaction Noted  . Apple Itching, Swelling, and Other (See Comments) 02/09/2018  . Depakote [divalproex sodium] Other (See Comments) 11/14/2012     Review of Systems:    Constitutional: No weight loss, fever or chills Skin: No rash Cardiovascular: No chest pain  Respiratory: No SOB  Gastrointestinal: See HPI and otherwise negative Genitourinary: No dysuria  Neurological: No headache Musculoskeletal: No new muscle or joint pain Hematologic: No bleeding Psychiatric: No history of depression or anxiety   Physical Exam:  Vital signs in last 24 hours: Temp:  [98.3 F (36.8 C)] 98.3 F (36.8 C) (03/05 0419) Pulse Rate:  [53-64] 55 (03/05 0942) Resp:  [16-18] 16 (03/05 0942) BP: (123-131)/(66-80) 131/80 (03/05 0942) SpO2:  [94 %-100 %] 96 % (03/05 0942) Weight:  [167 lb (75.8 kg)] 167 lb (75.8 kg) (03/05 0447)   General:  AA appears to be in NAD, Well developed, Well nourished, alert and cooperative Head:  Normocephalic and atraumatic. Eyes:   PEERL, EOMI. No icterus. Conjunctiva pink. Ears:  Normal auditory acuity. Neck:  Supple Throat: Oral cavity and pharynx without inflammation, swelling or lesion. Teeth in good condition. Lungs: Respirations even and unlabored. Lungs clear to auscultation bilaterally.   No wheezes, crackles, or rhonchi.  Heart: Normal S1, S2. No MRG. Regular rate and rhythm. No peripheral edema, cyanosis or pallor.  Abdomen:  Soft, nondistended, Moderate Generalized ttp, worse in epigastrum and LLQ with voluntary guarding. Normal bowel sounds. No appreciable masses or hepatomegaly. Rectal:  Not performed.  Msk:  Symmetrical without gross deformities. Peripheral pulses  intact.  Extremities:  Without edema, no deformity or joint abnormality. Normal ROM, normal sensation. Neurologic:  Alert and  oriented x4;  grossly normal neurologically.  Skin:   Dry and intact without significant lesions or rashes. Psychiatric: Demonstrates good judgement and reason without abnormal affect or behaviors.   LAB RESULTS: Recent Labs    02/09/18 0556 02/10/18 0527  WBC 4.4 3.2*  HGB 12.5* 12.4*  HCT 36.6* 35.6*  PLT 284 265   BMET Recent Labs    02/09/18 0556 02/10/18 0527  NA 139 138  K 3.4* 3.8  CL 107 107  CO2 26 24  GLUCOSE 109* 102*  BUN 14 9  CREATININE 0.82 0.83  CALCIUM 8.7* 8.4*   LFT Recent Labs    02/10/18 0527  PROT 6.6  ALBUMIN 3.8  AST 634*  ALT 665*  ALKPHOS 69  BILITOT 4.1*    STUDIES: Us Abdomen Limited Ruq    Result Date: 02/09/2018 CLINICAL DATA:  Abdominal pain EXAM: ULTRASOUND ABDOMEN LIMITED RIGHT UPPER QUADRANT COMPARISON:  07/24/2016 FINDINGS: Gallbladder: Gallbladder is well distended with multiple gallstones within. No gallbladder wall thickening or pericholecystic fluid is noted. Common bile duct: Diameter: 9.5  mm Liver: No focal lesion identified. Within normal limits in parenchymal echogenicity. Portal vein is patent on color Doppler imaging with normal direction of blood flow towards the liver. IMPRESSION: Cholelithiasis without evidence of cholecystitis. Prominent common bile duct. The possibility of a distal common bile duct stone cannot be excluded. Correlation with laboratory values is recommended. Electronically Signed   By: Mark  Lukens M.D.   On: 02/09/2018 08:43    Impression / Plan:   Impression: 1.  Elevated LFTs: See above, significant increase overnight, no alcohol overnight or herbal supplementation 2.  Dilated common bile duct: 9.5 cm in ultrasound yesterday 3.  Cholelithiasis 4.  Abdominal pain: Epigastrium and right upper quadrant plus left lower quadrant with history of HIV will rule out other  intraperitoneal process versus choledocholithiasis with MRCP 5.  Nausea 6.  History of HIV  Plan: 1.  Ordered MRCP for further evaluation and possible choledocholithiasis.  Discussed this with the patient. 2.  Continue other supportive measures 3.  If MRCP is positive will need an ERCP.  Likely this will be scheduled tomorrow with Dr. Stark.  Did discuss risk, benefits, limitations alternatives and the patient agrees to proceed if necessary. 4.  Also discussed with patient likely she would need her gallbladder out during this hospitalization due to what sounds like recurrent gallstone pancreatitis 5.  Please await final recommendations from Dr. Nandigam later today.  Thank you for your kind consultation, we will continue to follow.  Jennifer Lynne Lemmon  02/10/2018, 9:45 AM Pager #: [phone]   Attending physician's note   I have taken a history, examined the patient and reviewed the chart. I agree with the Advanced Practitioner's note, impression and recommendations. 21-year-old with history of HIV, cholelithiasis,  epigastric pain, elevated transaminases and bilirubin concerning for possible choledocholithiasis. Plan for MRCP to confirm prior to proceeding with ERCP.   K Veena Nandigam, MD [phone] Mon-Fri 8a-5p  Addendum: MRCP showed distal cbd stone 7mm in size. Patient left again AMA from ER. [phone] after 5p, weekends, holidays  

## 2018-02-10 NOTE — Consult Note (Addendum)
Consultation  Referring Provider:  Dr. Phylliss Bob     Primary Care Physician:  Travis Palmer, Travis Palmer Primary Gastroenterologist:  Travis Palmer       Reason for Consultation:  Elevated LFT's, Dilated CBD, Abdominal Pain            HPI:   Travis Palmer  "Travis Palmer" is a 22 y.o. adult with past medical history of HIV, pancreatitis and others listed below, who presented to the ER initially yesterday 02/09/18 with epigastric pain, but left AMA before testing was complete and returned to the ER this morning with continued abdominal pain and nausea.    Today, explains started with severe epigastric/right upper quadrant pain yesterday and came to the ER, felt better after pain medications and antiemetics and left AMA before MRCP.  Pain returned around 3:00 in the morning and patient came back to the ER.  Does describe history of 3 separate hospital visits for "pancreatitis".  Apparently, was told to have her gallbladder out on all 3 occasions but "did not want this done then".  Now with a 9-10/10 pain worse in her right upper quadrant, but also complaining of left lower quadrant pain.  Also with nausea which is better after antiemetics in the ER.    Admits to marijuana use but denies supplementation or other drugs of abuse.  On estrogen daily.    Denies fever, chills, weight loss, anorexia or change in bowel habits.  ER course: AST 634 (125 yesterday), ALT 665 (53 yesterday), alk phos 69 (36 yesterday), total bilirubin 4.1 (0.7 yesterday), ultrasound 02/09/18 with common bile duct at 9.5 cm and cholelithiasis without evidence of cholecystitis   Past Medical History:  Diagnosis Date  . Epigastric pain 10/01/2017  . History of bipolar disorder   . HIV infection ([place])   . Housing problems 08/26/2016  . Routine screening for STI (sexually transmitted infection) 01/26/2018  . Syphilis 08/02/2015    Past Surgical History:  Procedure Laterality Date  . WISDOM TOOTH EXTRACTION  2014    Family History    Problem Relation Age of Onset  . Lactose intolerance Mother   . Cancer Maternal Aunt   . Hypertension Maternal Grandmother      Social History   Tobacco Use  . Smoking status: Current Some Day Smoker    Packs/day: 0.00    Types: Cigarettes  . Smokeless tobacco: Never Used  Substance Use Topics  . Alcohol use: Yes    Alcohol/week: 0.0 oz    Comment: weekend drinker  . Drug use: Yes    Frequency: 7.0 times per week    Types: Marijuana    Comment: every other day    Prior to Admission medications   Medication Sig Start Date End Date Taking? Authorizing Provider  bictegravir-emtricitabine-tenofovir AF (BIKTARVY) 50-200-25 MG TABS tablet Take 1 tablet by mouth daily. 01/26/18  Yes Travis Palmer, Travis Palmer  doxycycline (VIBRA-TABS) 100 MG tablet Take two tablets within 24-73 hours of sex to prevent STI 01/26/18  Yes Travis Palmer, Travis Palmer  estradiol (ESTRACE) 2 MG tablet Take 2 tablets in the morning and 1 at night Patient taking differently: Take 2-4 mg by mouth 2 (two) times daily. Take 2 tablets in the morning and 1 at night 01/26/18  Yes Travis Palmer, Travis Palmer  ibuprofen (ADVIL,MOTRIN) 200 MG tablet Take 600-800 mg by mouth every 6 (six) hours as needed (For pain.).   Yes Provider, Historical, Palmer  spironolactone (ALDACTONE) 100 MG  tablet Take 1 tablet (100 mg total) by mouth daily. 01/26/18  Yes Travis Palmer, Travis Palmer    No current facility-administered medications for this encounter.    Current Outpatient Medications  Medication Sig Dispense Refill  . bictegravir-emtricitabine-tenofovir AF (BIKTARVY) 50-200-25 MG TABS tablet Take 1 tablet by mouth daily. 30 tablet 11  . doxycycline (VIBRA-TABS) 100 MG tablet Take two tablets within 24-73 hours of sex to prevent STI 60 tablet 0  . estradiol (ESTRACE) 2 MG tablet Take 2 tablets in the morning and 1 at night (Patient taking differently: Take 2-4 mg by mouth 2 (two) times daily. Take 2 tablets in the morning and 1 at night) 90  tablet 11  . ibuprofen (ADVIL,MOTRIN) 200 MG tablet Take 600-800 mg by mouth every 6 (six) hours as needed (For pain.).    Marland Kitchen spironolactone (ALDACTONE) 100 MG tablet Take 1 tablet (100 mg total) by mouth daily. 30 tablet 11    Allergies as of 02/10/2018 - Review Complete 02/10/2018  Allergen Reaction Noted  . Apple Itching, Swelling, and Other (See Comments) 02/09/2018  . Depakote [divalproex sodium] Other (See Comments) 11/14/2012     Review of Systems:    Constitutional: No weight loss, fever or chills Skin: No rash Cardiovascular: No chest pain  Respiratory: No SOB  Gastrointestinal: See HPI and otherwise negative Genitourinary: No dysuria  Neurological: No headache Musculoskeletal: No new muscle or joint pain Hematologic: No bleeding Psychiatric: No history of depression or anxiety   Physical Exam:  Vital signs in last 24 hours: Temp:  [98.3 F (36.8 C)] 98.3 F (36.8 C) (03/05 0419) Pulse Rate:  [53-64] 55 (03/05 0942) Resp:  [16-18] 16 (03/05 0942) BP: (123-131)/(66-80) 131/80 (03/05 0942) SpO2:  [94 %-100 %] 96 % (03/05 0942) Weight:  [167 lb (75.8 kg)] 167 lb (75.8 kg) (03/05 0447)   General:  AA appears to be in NAD, Well developed, Well nourished, alert and cooperative Head:  Normocephalic and atraumatic. Eyes:   PEERL, EOMI. No icterus. Conjunctiva pink. Ears:  Normal auditory acuity. Neck:  Supple Throat: Oral cavity and pharynx without inflammation, swelling or lesion. Teeth in good condition. Lungs: Respirations even and unlabored. Lungs clear to auscultation bilaterally.   No wheezes, crackles, or rhonchi.  Heart: Normal S1, S2. No MRG. Regular rate and rhythm. No peripheral edema, cyanosis or pallor.  Abdomen:  Soft, nondistended, Moderate Generalized ttp, worse in epigastrum and LLQ with voluntary guarding. Normal bowel sounds. No appreciable masses or hepatomegaly. Rectal:  Not performed.  Msk:  Symmetrical without gross deformities. Peripheral pulses  intact.  Extremities:  Without edema, no deformity or joint abnormality. Normal ROM, normal sensation. Neurologic:  Alert and  oriented x4;  grossly normal neurologically.  Skin:   Dry and intact without significant lesions or rashes. Psychiatric: Demonstrates good judgement and reason without abnormal affect or behaviors.   LAB RESULTS: Recent Labs    02/09/18 0556 02/10/18 0527  WBC 4.4 3.2*  HGB 12.5* 12.4*  HCT 36.6* 35.6*  PLT 284 265   BMET Recent Labs    02/09/18 0556 02/10/18 0527  NA 139 138  K 3.4* 3.8  CL 107 107  CO2 26 24  GLUCOSE 109* 102*  BUN 14 9  CREATININE 0.82 0.83  CALCIUM 8.7* 8.4*   LFT Recent Labs    02/10/18 0527  PROT 6.6  ALBUMIN 3.8  AST 634*  ALT 665*  ALKPHOS 69  BILITOT 4.1*    STUDIES: US Abdomen Limited Ruq  Result Date: 02/09/2018 CLINICAL DATA:  Abdominal pain EXAM: ULTRASOUND ABDOMEN LIMITED RIGHT UPPER QUADRANT COMPARISON:  07/24/2016 FINDINGS: Gallbladder: Gallbladder is well distended with multiple gallstones within. No gallbladder wall thickening or pericholecystic fluid is noted. Common bile duct: Diameter: 9.5  mm Liver: No focal lesion identified. Within normal limits in parenchymal echogenicity. Portal vein is patent on color Doppler imaging with normal direction of blood flow towards the liver. IMPRESSION: Cholelithiasis without evidence of cholecystitis. Prominent common bile duct. The possibility of a distal common bile duct stone cannot be excluded. Correlation with laboratory values is recommended. Electronically Signed   By: Inez Catalina M.D.   On: 02/09/2018 08:43    Impression / Plan:   Impression: 1.  Elevated LFTs: See above, significant increase overnight, no alcohol overnight or herbal supplementation 2.  Dilated common bile duct: 9.5 cm in ultrasound yesterday 3.  Cholelithiasis 4.  Abdominal pain: Epigastrium and right upper quadrant plus left lower quadrant with history of HIV will rule out other  intraperitoneal process versus choledocholithiasis with MRCP 5.  Nausea 6.  History of HIV  Plan: 1.  Ordered MRCP for further evaluation and possible choledocholithiasis.  Discussed this with the patient. 2.  Continue other supportive measures 3.  If MRCP is positive will need an ERCP.  Likely this will be scheduled tomorrow with Dr. Fuller Plan.  Did discuss risk, benefits, limitations alternatives and the patient agrees to proceed if necessary. 4.  Also discussed with patient likely she would need her gallbladder out during this hospitalization due to what sounds like recurrent gallstone pancreatitis 5.  Please await final recommendations from Dr. Silverio Decamp later today.  Thank you for your kind consultation, we will continue to follow.  Lavone Nian Lemmon  02/10/2018, 9:45 AM Pager #: [phone]   Attending physician's note   I have taken a history, examined the patient and reviewed the chart. I agree with the Advanced Practitioner's note, impression and recommendations. 22 year old with history of HIV, cholelithiasis,  epigastric pain, elevated transaminases and bilirubin concerning for possible choledocholithiasis. Plan for MRCP to confirm prior to proceeding with ERCP.   K Denzil Magnuson, Palmer [phone] Mon-Fri 8a-5p  Addendum: MRCP showed distal cbd stone 35m in size. Patient left again AMA from ER. [phone] 5p, weekends, holidays

## 2018-02-10 NOTE — ED Notes (Signed)
Pt stated she cant give a urine sample do to her been in pain

## 2018-02-10 NOTE — ED Provider Notes (Signed)
[facility] DEPT Provider Note   CSN: [id number] Arrival date & time: 02/10/18  0347     History   Chief Complaint Chief Complaint  Patient presents with  . Abdominal Pain    HPI Travis Palmer [name] is a 22 y.o. adult MTF with a history of HIV, gonorrhea chlamydia, syphilis, bipolar who presents today for evaluation of abdominal pain.  She was seen yesterday for the same complaint.  She reports that her pain started yesterday morning and is generalized across her entire abdomen however worse in the epigastric and right upper quadrant areas.  She reports nausea but denies any vomiting.  Last bowel movement was 2 days ago normal.  She reports she has a history of pancreatitis.  She reports that she has burning when she pees and reports that she was treated for gonorrhea about three weeks ago and has not had any unprotected sex since.    Yesterday she had a right upper quadrant ultrasound showing multiple gallstones and Lemmon PA-C from [facility] who recommended ERCP, but patient left AMA.      HPI  Past Medical History:  Diagnosis Date  . Epigastric pain 10/01/2017  . History of bipolar disorder   . HIV infection ([place])   . Housing problems 08/26/2016  . Routine screening for STI (sexually transmitted infection) 01/26/2018  . Syphilis 08/02/2015    Patient Active Problem List   Diagnosis Date Noted  . Common bile duct dilation 02/10/2018  . Routine screening for STI (sexually transmitted infection) 01/26/2018  . Epigastric pain 10/01/2017  . 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 Chlamydia 05/07/2017  . Housing problems 08/26/2016  . Syphilis 08/02/2015  . HIV disease ([place]) 01/09/2015  . 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 Cigarette smoker 01/09/2015    Past Surgical History:  Procedure Laterality Date  . [place] EXTRACTION  2014       Home Medications    Prior to Admission medications   Medication Sig Start Date End Date Taking?  Authorizing Provider  bictegravir-emtricitabine-tenofovir AF (BIKTARVY) 50-200-25 MG TABS tablet Take 1 tablet by mouth daily. 01/26/18  Yes Tommy Medal, Lavell Islam, MD  doxycycline (VIBRA-TABS) 100 MG tablet Take two tablets within 24-73 hours of sex to prevent STI 01/26/18  Yes Tommy Medal, Lavell Islam, MD  estradiol (ESTRACE) 2 MG tablet Take 2 tablets in the morning and 1 at night Patient taking differently: Take 2-4 mg by mouth See admin instructions. Take 4 mg by mouth in the morning and take 2 mg by mouth at bedtime 01/26/18  Yes Tommy Medal, Lavell Islam, MD  ibuprofen (ADVIL,MOTRIN) 200 MG tablet Take 600-800 mg by mouth every 6 (six) hours as needed for headache or moderate pain.    Yes [provider]  spironolactone (ALDACTONE) 100 MG tablet Take 1 tablet (100 mg total) by mouth daily. 01/26/18  Yes Tommy Medal, Lavell Islam, MD    Family History Family History  Problem Relation Age of Onset  . Lactose intolerance Mother   . Cancer Maternal Aunt   . Hypertension Maternal Grandmother     Social History Social History   Tobacco Use  . Smoking status: Current Some Day Smoker    Packs/day: 0.00    Types: Cigarettes  . Smokeless tobacco: Never Used  Substance Use Topics  . Alcohol use: Yes    Alcohol/week: 0.0 oz    Comment: weekend drinker  . Drug use: Yes    Frequency: 7.0 times per week  Types: Marijuana    Comment: every other day     Allergies   Apple and Depakote [divalproex sodium]   Review of Systems Review of Systems  Constitutional: Negative for chills and fever.  HENT: Negative for congestion.   Eyes: Negative for visual disturbance.  Respiratory: Negative for cough, chest tightness and shortness of breath.   Cardiovascular: Negative for chest pain.  Gastrointestinal: Positive for abdominal pain and nausea. Negative for blood in stool, diarrhea and vomiting.  Genitourinary: Positive for dysuria. Negative for frequency and urgency.  Skin: Negative for rash.    Allergic/Immunologic: Positive for immunocompromised state (HIV).  Neurological: Negative for weakness and light-headedness.  All other systems reviewed and are negative.    Physical Exam Updated Vital Signs BP 131/80 (BP Location: Right Arm)   Pulse (!) 55   Temp 98.3 F (36.8 C) (Oral)   Resp 16   Ht 5\' 7"  (1.702 m)   Wt 75.8 kg (167 lb)   SpO2 96%   BMI 26.16 kg/m   Physical Exam  Constitutional: [name] oriented to person, place, and time. She appears well-developed and well-nourished. No distress.  HENT:  Head: Normocephalic and atraumatic.  Mouth/Throat: Oropharynx is clear and moist.  Eyes: Conjunctivae are normal. Right eye exhibits no discharge. Left eye exhibits no discharge. No scleral icterus.  Neck: Normal range of motion. Neck supple.  Cardiovascular: Normal rate, regular rhythm and normal heart sounds.  No murmur heard. Pulmonary/Chest: Effort normal. No stridor. No respiratory distress.  Abdominal: Soft. Normal appearance and bowel sounds are normal. She exhibits no distension. There is tenderness in the right upper quadrant and epigastric area. There is guarding. There is no rebound, no CVA tenderness and no tenderness at McBurney's point.  Musculoskeletal: She exhibits no edema or deformity.  Neurological: [name] alert and oriented to person, place, and time. She exhibits normal muscle tone.  Skin: Skin is warm and dry. [name] not diaphoretic.  Psychiatric: She has a normal mood and affect. Her behavior is normal.  Nursing note and vitals reviewed.    ED Treatments / Results  Labs (all labs ordered are listed, but only abnormal results are displayed) Labs Reviewed  COMPREHENSIVE METABOLIC PANEL - Abnormal; Notable for the following components:      Result Value   Glucose, Bld 102 (*)    Calcium 8.4 (*)    AST 634 (*)    ALT 665 (*)    Total Bilirubin 4.1 (*)    All other components within normal limits  CBC - Abnormal; Notable for the following  components:   WBC 3.2 (*)    RBC 3.73 (*)    Hemoglobin 12.4 (*)    HCT 35.6 (*)    All other components within normal limits  URINALYSIS, ROUTINE W REFLEX MICROSCOPIC - Abnormal; Notable for the following components:   Color, Urine AMBER (*)    Bilirubin Urine SMALL (*)    All other components within normal limits  ACETAMINOPHEN LEVEL - Abnormal; Notable for the following components:   Acetaminophen (Tylenol), Serum <10 (*)    All other components within normal limits  LIPASE, BLOOD  HEPATITIS PANEL, ACUTE  URINE DRUGS OF ABUSE SCREEN W ALC, ROUTINE (REF LAB)    EKG  EKG Interpretation None       Radiology Mr Abdomen Mrcp Wo Contrast  Result Date: 02/10/2018 CLINICAL DATA:  Abdominal pain, cholelithiasis, dilated CBD on ultrasound. EXAM: MRI ABDOMEN WITHOUT CONTRAST  (INCLUDING MRCP) TECHNIQUE: Multiplanar multisequence  MR imaging of the abdomen was performed. Heavily T2-weighted images of the biliary and pancreatic ducts were obtained, and three-dimensional MRCP images were rendered by post processing. COMPARISON:  Right upper quadrant ultrasound dated 02/09/2018 FINDINGS: Motion degraded images. Lower chest: Lung bases are clear. Hepatobiliary: Liver is within normal limits.  No hepatic steatosis. Numerous layering gallstones (series 10/image 29), without gallbladder wall thickening or associated inflammatory changes. Mild intrahepatic ductal dilatation. Dilated common duct, measuring 9 mm. Associated 7 mm distal CBD stone (series 9/image 20). Pancreas:  Within normal limits. Spleen:  Within normal limits. Adrenals/Urinary Tract:  Adrenal glands are within normal limits. Kidneys are within normal limits.  No hydronephrosis. Stomach/Bowel: Stomach is within normal limits. Visualized bowel is unremarkable. Vascular/Lymphatic:  No evidence of abdominal aortic aneurysm. No suspicious abdominal lymphadenopathy. Other:  No abdominal ascites. Musculoskeletal: No focal osseous lesions.  IMPRESSION: Cholelithiasis, without associated inflammatory changes to suggest acute cholecystitis. Mild intrahepatic and extrahepatic ductal dilatation. Dilated common duct, measuring 9 mm. Choledocholithiasis with associated 7 mm distal CBD stone. ERCP is suggested. Electronically Signed   By: Julian Hy M.D.   On: 02/10/2018 12:20   Mr 3d Recon At Scanner  Result Date: 02/10/2018 CLINICAL DATA:  Abdominal pain, cholelithiasis, dilated CBD on ultrasound. EXAM: MRI ABDOMEN WITHOUT CONTRAST  (INCLUDING MRCP) TECHNIQUE: Multiplanar multisequence MR imaging of the abdomen was performed. Heavily T2-weighted images of the biliary and pancreatic ducts were obtained, and three-dimensional MRCP images were rendered by post processing. COMPARISON:  Right upper quadrant ultrasound dated 02/09/2018 FINDINGS: Motion degraded images. Lower chest: Lung bases are clear. Hepatobiliary: Liver is within normal limits.  No hepatic steatosis. Numerous layering gallstones (series 10/image 29), without gallbladder wall thickening or associated inflammatory changes. Mild intrahepatic ductal dilatation. Dilated common duct, measuring 9 mm. Associated 7 mm distal CBD stone (series 9/image 20). Pancreas:  Within normal limits. Spleen:  Within normal limits. Adrenals/Urinary Tract:  Adrenal glands are within normal limits. Kidneys are within normal limits.  No hydronephrosis. Stomach/Bowel: Stomach is within normal limits. Visualized bowel is unremarkable. Vascular/Lymphatic:  No evidence of abdominal aortic aneurysm. No suspicious abdominal lymphadenopathy. Other:  No abdominal ascites. Musculoskeletal: No focal osseous lesions. IMPRESSION: Cholelithiasis, without associated inflammatory changes to suggest acute cholecystitis. Mild intrahepatic and extrahepatic ductal dilatation. Dilated common duct, measuring 9 mm. Choledocholithiasis with associated 7 mm distal CBD stone. ERCP is suggested. Electronically Signed   By: Julian Hy M.D.   On: 02/10/2018 12:20    Procedures Procedures (including critical care time)  Medications Ordered in ED Medications  sodium chloride 0.9 % bolus 1,000 mL (0 mLs Intravenous Stopped 02/10/18 1147)  ondansetron (ZOFRAN) injection 4 mg (4 mg Intravenous Given 02/10/18 0711)  fentaNYL (SUBLIMAZE) injection 100 mcg (100 mcg Intravenous Given 02/10/18 0711)     Initial Impression / Assessment and Plan / ED Course  I have reviewed the triage vital signs and the nursing notes.  Pertinent labs & imaging results that were available during my care of the patient were reviewed by me and considered in my medical decision making (see chart for details).  Clinical Course as of Feb 12 145  Tue Feb 10, 2018  0732 Spoke with Dr. Hilarie Fredrickson who will come and see the patient.   [EH]  0840 Spoke with Tina Griffiths PA-C from [facility] who will come see patient.   [EH]  94 Was informed that patient was being rude to staff, yelling and cussing because she did not get pain medicine soon enough.  Patient was being very loud, argumentative.  She explained "I I am gay I can't control how loud I talk."  Verbal de-escalation used.    [EH]  [place] with Dr. Shanon Brow who will admit patient.   [EH]    Clinical Course User Index [EH] Lorin Glass, PA-C   Patient presents today for evaluation of abdominal pain.  She was seen yesterday with concern for common bile duct stone.  Since yesterday her AST and ALT have elevated quite significantly both now in the 600s.  Her bilirubin has also elevated to 4.1.  GI was reconsulted to recommended MRI.  This showed concern for common bile duct stone.  Hospitalist was consulted for admission.  I spoke with Dr. Shanon Brow who agreed to come and see the patient.  Her pain was treated in the emergency room with IV narcotics and she was given fluids and antiemetics.  Patient will be evaluated by Dr. Shanon Brow.    The patient appears reasonably stabilized for admission considering the  current resources, flow, and capabilities available in the ED at this time, and I doubt any other [facility] requiring further screening and/or treatment in the ED prior to admission.  Final Clinical Impressions(s) / ED Diagnoses   Final diagnoses:  Abdominal pain    ED Discharge Orders    None       Ollen Gross 02/12/18 0150    Ezequiel Essex, MD 02/12/18 2129

## 2018-02-10 NOTE — ED Notes (Signed)
Patient transported to MRI 

## 2018-02-10 NOTE — ED Notes (Signed)
IV removed at this time. Patient refusing inpatient admission at this time. Risks explained to patient. Provider aware and also explained risks to patient. Security at bedside to escort patient out to the lobby. Mother here to transport patient upon [facility] discharge. Patient refused to let this writer obtain vital signs and refused to sign AMA paperwork.  Patient walked out of ED with steady gait, conscious, alert and oriented.

## 2018-02-10 NOTE — ED Notes (Addendum)
This writer attempted to explain delay to patient regarding medication administration. Provider and NT at bedside. Patient became verbally aggressive with staff, stating that no one understands and she is tired of sitting in the room alone. MRI tech at bedside to transport patient to MRI. Patient threatened to leave stating "Y'all are not doing nothing for me. I feel terrible." Delay explained multiple times to patient. Patient is also constantly removing herself off the monitor. Rationale provided for monitor indication, patient proceeded to continue to remove it.

## 2018-02-10 NOTE — ED Notes (Signed)
RN stepped in room to provide pt with Zofran for some pain relief. The pt had called the call bell 2x within 3-5 minutes. The RN that is assigned to pt was calling report at the time so I, the point nurse stepped in to provide medication. As soon as RN stepped in room pt began raising voice and was very upset and angry. Pt continued to talk over nurse and raise voice. NT Jonnna stepped in to witness situation and attempted to de-escalate the situation. Pt continued to raise her voice and be rude. Around 1229 the Admitting MD walked into pt room with RN that was assigned to pt room and pt began doing the same thing with the admitting MD that she was doing to RN and NT. Pt was asked by Admitting MD 3-4 times "what is wrong" and "I am trying to help ma'am but I need you to talk to me" but pt began to ignore everyone in room and asked for cellphone. The admitting MD asked one more time if pt would speak with her and that admitting MD is trying to help. At that point patient started dialing numbers on phone after she made comment "Im not staying here, this place is shit, Im gonna go to another hospital". RN and NT stepped outside with RN assigned to patientwith nurse and admitting MD.

## 2018-02-10 NOTE — ED Notes (Signed)
RN walked into the room to give pain medicine and pt raised their voice and RN. This was a floating Therapist, sports and had not been in the room at all to meet the pt. RN asked why she was upset that they had the pt's pain medicine and was only trying to help because the PT's RN was stuck in another room with another pt. Pt started to raise voice at floating RN.  Admitting MD walked into the room to greet pt. As soon as admitting MD introduced themselves, pt started raising their voice and talking over top of MD. MD asked why was the pt so upset that this was the first time they were meeting them and there was no need for the pt to raise their voice at MD. Pt continued to raise their voice and be rude and impatient with MD. Pt repetitively stated that this was not the hospital for her and that she was ready to leave.

## 2018-02-10 NOTE — H&P (Signed)
History and Physical    GUNNISON CHAHAL SWF:[id number] DOB: 1995-12-21 DOA: 02/10/2018  PCP: Truman Hayward, MD  Patient coming from: Home  Chief Complaint: Abdominal pain  HPI: DUGLAS HEIER is a 22 y.o. adult with medical history significant of transgender, HIV, epigastric pain, bipolar disorder comes to the emergency department because of epigastric pain for the second time in the last 24 hours.  Patient is refusing to answer any questions.  [name] yelling at staff members and at me for unknown reasons as soon as I step into room.  she is not happy about the amount of pain meds she is getting or lack thereof or not getting immediate attention in the ED.  I have tried to discuss with the patient her care with several staff members in the room in the ED but she continues to yell.  Patient appears stable but upset.  [name] refusing to answer any of my questions.  [name] refusing to allow me to examine her.  I have advised the patient that it will be very difficult for me to accurately access her medically if she does not cooperate.  She still does not calm down with trying to de-escalate her.  [name] cursing.  [name] considering leaving AMA.  Review of Systems: Unable to obtain as patient refuses to answer any questions  Past Medical History:  Diagnosis Date  . Epigastric pain 10/01/2017  . History of bipolar disorder   . HIV infection ([place])   . Housing problems 08/26/2016  . Routine screening for STI (sexually transmitted infection) 01/26/2018  . Syphilis 08/02/2015    Past Surgical History:  Procedure Laterality Date  . WISDOM TOOTH EXTRACTION  2014     reports that she has been smoking cigarettes.  She has been smoking about 0.00 packs per day. she has never used smokeless tobacco. She reports that she drinks alcohol. She reports that she uses drugs. Drug: Marijuana. Frequency: 7.00 times per week.  Allergies  Allergen Reactions  . Apple Itching, Swelling and Other (See Comments)    Mouth swells  . Depakote [Divalproex Sodium] Other (See Comments)    Hospitalized for 3 days for extreme GI upset because of this    Family History  Problem Relation Age of Onset  . Lactose intolerance Mother   . Cancer Maternal Aunt   . Hypertension Maternal Grandmother     Prior to Admission medications   Medication Sig Start Date End Date Taking? Authorizing Provider  bictegravir-emtricitabine-tenofovir AF (BIKTARVY) 50-200-25 MG TABS tablet Take 1 tablet by mouth daily. 01/26/18  Yes Tommy Medal, Lavell Islam, MD  doxycycline (VIBRA-TABS) 100 MG tablet Take two tablets within 24-73 hours of sex to prevent STI 01/26/18  Yes Tommy Medal, Lavell Islam, MD  estradiol (ESTRACE) 2 MG tablet Take 2 tablets in the morning and 1 at night Patient taking differently: Take 2-4 mg by mouth 2 (two) times daily. Take 2 tablets in the morning and 1 at night 01/26/18  Yes Tommy Medal, Lavell Islam, MD  ibuprofen (ADVIL,MOTRIN) 200 MG tablet Take 600-800 mg by mouth every 6 (six) hours as needed (For pain.).   Yes [provider]  spironolactone (ALDACTONE) 100 MG tablet Take 1 tablet (100 mg total) by mouth daily. 01/26/18  Yes Truman Hayward, MD    Physical Exam: Vitals:   02/10/18 0419 02/10/18 0447 02/10/18 0643 02/10/18 0942  BP: 126/77  123/70 131/80  Pulse: 60  (!) 53 Marland Kitchen)  55  Resp: 16  16 16   Temp: 98.3 F (36.8 C)     TempSrc: Oral     SpO2: 100%  94% 96%  Weight:  75.8 kg (167 lb)    Height:  5\' 7"  (1.702 m)     Patient alert and under no apparent respiratory distress patient combative yelling and very verbally abusive to myself and many staff members Patient appears stable unable to perform any other exam as patient is not allowing me to perform exam or answer questions at this time    Labs on Admission: I have personally reviewed following labs and imaging studies  CBC: Recent Labs  Lab 02/09/18 0556 02/10/18 0527  WBC 4.4 3.2*  HGB 12.5* 12.4*  HCT 36.6* 35.6*  MCV 97.1  95.4  PLT 284 789   Basic Metabolic Panel: Recent Labs  Lab 02/09/18 0556 02/10/18 0527  NA 139 138  K 3.4* 3.8  CL 107 107  CO2 26 24  GLUCOSE 109* 102*  BUN 14 9  CREATININE 0.82 0.83  CALCIUM 8.7* 8.4*   GFR: Estimated Creatinine Clearance (by C-G formula based on SCr of 0.83 mg/dL) Male: 112 mL/min Male: 129.4 mL/min Liver Function Tests: Recent Labs  Lab 02/09/18 0556 02/10/18 0527  AST 125* 634*  ALT 53 665*  ALKPHOS 36* 69  BILITOT 0.7 4.1*  PROT 6.9 6.6  ALBUMIN 4.0 3.8   Recent Labs  Lab 02/09/18 0556 02/10/18 0527  LIPASE 23 21   No results for input(s): AMMONIA in the last 168 hours. Coagulation Profile: No results for input(s): INR, PROTIME in the last 168 hours. Cardiac Enzymes: No results for input(s): CKTOTAL, CKMB, CKMBINDEX, TROPONINI in the last 168 hours. BNP (last 3 results) No results for input(s): PROBNP in the last 8760 hours. HbA1C: No results for input(s): HGBA1C in the last 72 hours. CBG: No results for input(s): GLUCAP in the last 168 hours. Lipid Profile: No results for input(s): CHOL, HDL, LDLCALC, TRIG, CHOLHDL, LDLDIRECT in the last 72 hours. Thyroid Function Tests: No results for input(s): TSH, T4TOTAL, FREET4, T3FREE, THYROIDAB in the last 72 hours. Anemia Panel: No results for input(s): VITAMINB12, FOLATE, FERRITIN, TIBC, IRON, RETICCTPCT in the last 72 hours. Urine analysis:    Component Value Date/Time   COLORURINE AMBER (A) 02/10/2018 0845   APPEARANCEUR CLEAR 02/10/2018 0845   LABSPEC 1.029 02/10/2018 0845   PHURINE 6.0 02/10/2018 0845   GLUCOSEU NEGATIVE 02/10/2018 0845   HGBUR NEGATIVE 02/10/2018 0845   BILIRUBINUR SMALL (A) 02/10/2018 0845   BILIRUBINUR Negative 01/31/2015 1340   KETONESUR NEGATIVE 02/10/2018 0845   PROTEINUR NEGATIVE 02/10/2018 0845   UROBILINOGEN 1.0 01/31/2015 1340   UROBILINOGEN 0.2 12/07/2014 1205   NITRITE NEGATIVE 02/10/2018 0845   LEUKOCYTESUR NEGATIVE 02/10/2018 0845   Sepsis  Labs: !!!!!!!!!!!!!!!!!!!!!!!!!!!!!!!!!!!!!!!!!!!! @LABRCNTIP (procalcitonin:4,lacticidven:4) )No results found for this or any previous visit (from the past 240 hour(s)).   Radiological Exams on Admission: US Abdomen Limited Ruq  Result Date: 02/09/2018 CLINICAL DATA:  Abdominal pain EXAM: ULTRASOUND ABDOMEN LIMITED RIGHT UPPER QUADRANT COMPARISON:  07/24/2016 FINDINGS: Gallbladder: Gallbladder is well distended with multiple gallstones within. No gallbladder wall thickening or pericholecystic fluid is noted. Common bile duct: Diameter: 9.5  mm Liver: No focal lesion identified. Within normal limits in parenchymal echogenicity. Portal vein is patent on color Doppler imaging with normal direction of blood flow towards the liver. IMPRESSION: Cholelithiasis without evidence of cholecystitis. Prominent common bile duct. The possibility of a distal common bile duct stone cannot be  excluded. Correlation with laboratory values is recommended. Electronically Signed   By: Inez Catalina M.D.   On: 02/09/2018 08:43    Old chart reviewed  Case discussed with EDP  Case discussed with 4 different staff members in the ED who all report patient has been verbally abusive since 7:00 AM this morning despite multiple attempts to try to appease her   Assessment/Plan 22 year old transgender male comes in with epigastric pain with MRCP showing a common bile duct stone Principal Problem:   Common bile duct dilation with stone-GI has been consulted MRCP does show a stone with common bile duct dilation.  Will likely need ERCP if patient agreeable.   Active Problems:   Epigastric pain-noted.  Difficult to assess patient's symptoms or history as she is refusing to answer questions.  MRCP however does explain that she does have a CBD stone with ductal dilation.    HIV disease (HCC)-continue her HIV meds.  She had CD4 counts and HIV viral load done in February 2019 CD4 count was over 500 and her HIV load was less than  20    Transgender-noted  Attempts have been made to calm the patient down unsuccessfully with several ED nursing staff present in the room.  Perhaps she will calm down  and I will be able to speak to her later on in the day if she does not leave AMA.    I have also explained to the patient that we cannot force her to stay at this hospital or cooperate in getting her the care she needs but cursing and yelling at staff members will not be tolerated.  DVT prophylaxis: SCDs Code Status: Full Family Communication: None Disposition Plan: Per day team Consults called: GI Admission status: Admission however patient may ultimately end up leaving as she is not happy with the care that has been provided to her so for in the ED.   DAVID,RACHAL A MD [facility]  If 7PM-7AM, please contact night-coverage [email or web address] Password [email or web address]  02/10/2018, 11:35 AM

## 2018-02-10 NOTE — ED Triage Notes (Signed)
Pt reports having abd pain and nausea that had started yesterday. Pt was seen yesterday for similar complaints.

## 2018-02-11 ENCOUNTER — Other Ambulatory Visit: Payer: Self-pay

## 2018-02-11 ENCOUNTER — Encounter (HOSPITAL_COMMUNITY): Payer: Self-pay | Admitting: Emergency Medicine

## 2018-02-11 LAB — HEPATITIS PANEL, ACUTE
HEP A IGM: NEGATIVE
Hep B C IgM: NEGATIVE
Hepatitis B Surface Ag: NEGATIVE

## 2018-02-12 ENCOUNTER — Ambulatory Visit (HOSPITAL_COMMUNITY): Admission: RE | Admit: 2018-02-12 | Payer: Medicaid Other | Source: Ambulatory Visit | Admitting: Gastroenterology

## 2018-02-12 ENCOUNTER — Ambulatory Visit (HOSPITAL_COMMUNITY): Payer: Self-pay

## 2018-02-12 ENCOUNTER — Ambulatory Visit (HOSPITAL_COMMUNITY): Payer: Self-pay | Admitting: Anesthesiology

## 2018-02-12 ENCOUNTER — Ambulatory Visit (HOSPITAL_COMMUNITY)
Admission: RE | Admit: 2018-02-12 | Discharge: 2018-02-12 | Disposition: A | Discharge status: Home / Self Care | Payer: Self-pay | Source: Ambulatory Visit | Attending: Gastroenterology | Admitting: Gastroenterology

## 2018-02-12 ENCOUNTER — Encounter (HOSPITAL_COMMUNITY): Payer: Self-pay

## 2018-02-12 ENCOUNTER — Encounter (HOSPITAL_COMMUNITY)
Admission: RE | Disposition: A | Discharge status: Home / Self Care | Payer: Self-pay | Source: Ambulatory Visit | Attending: Gastroenterology

## 2018-02-12 ENCOUNTER — Other Ambulatory Visit: Payer: Self-pay

## 2018-02-12 ENCOUNTER — Encounter (HOSPITAL_COMMUNITY): Admission: RE | Payer: Self-pay | Source: Ambulatory Visit

## 2018-02-12 DIAGNOSIS — K8050 Calculus of bile duct without cholangitis or cholecystitis without obstruction: Secondary | ICD-10-CM | POA: Insufficient documentation

## 2018-02-12 DIAGNOSIS — R945 Abnormal results of liver function studies: Secondary | ICD-10-CM

## 2018-02-12 DIAGNOSIS — R7989 Other specified abnormal findings of blood chemistry: Secondary | ICD-10-CM

## 2018-02-12 DIAGNOSIS — R1013 Epigastric pain: Secondary | ICD-10-CM

## 2018-02-12 DIAGNOSIS — Z8489 Family history of other specified conditions: Secondary | ICD-10-CM | POA: Insufficient documentation

## 2018-02-12 DIAGNOSIS — Z21 Asymptomatic human immunodeficiency virus [HIV] infection status: Secondary | ICD-10-CM | POA: Insufficient documentation

## 2018-02-12 DIAGNOSIS — Z809 Family history of malignant neoplasm, unspecified: Secondary | ICD-10-CM | POA: Insufficient documentation

## 2018-02-12 DIAGNOSIS — K8020 Calculus of gallbladder without cholecystitis without obstruction: Secondary | ICD-10-CM

## 2018-02-12 DIAGNOSIS — Z8249 Family history of ischemic heart disease and other diseases of the circulatory system: Secondary | ICD-10-CM | POA: Insufficient documentation

## 2018-02-12 DIAGNOSIS — K8051 Calculus of bile duct without cholangitis or cholecystitis with obstruction: Secondary | ICD-10-CM

## 2018-02-12 DIAGNOSIS — F17210 Nicotine dependence, cigarettes, uncomplicated: Secondary | ICD-10-CM | POA: Insufficient documentation

## 2018-02-12 DIAGNOSIS — F1290 Cannabis use, unspecified, uncomplicated: Secondary | ICD-10-CM | POA: Insufficient documentation

## 2018-02-12 DIAGNOSIS — F319 Bipolar disorder, unspecified: Secondary | ICD-10-CM | POA: Insufficient documentation

## 2018-02-12 DIAGNOSIS — Z79899 Other long term (current) drug therapy: Secondary | ICD-10-CM | POA: Insufficient documentation

## 2018-02-12 HISTORY — PX: ENDOSCOPIC RETROGRADE CHOLANGIOPANCREATOGRAPHY (ERCP) WITH PROPOFOL: SHX5810

## 2018-02-12 SURGERY — ENDOSCOPIC RETROGRADE CHOLANGIOPANCREATOGRAPHY (ERCP) WITH PROPOFOL
Anesthesia: General

## 2018-02-12 SURGERY — ERCP, WITH INTERVENTION IF INDICATED
Anesthesia: General

## 2018-02-12 MED ORDER — LACTATED RINGERS IV SOLN
INTRAVENOUS | Status: DC
  Administered 2018-02-12: 1000 mL via INTRAVENOUS

## 2018-02-12 MED ORDER — INDOMETHACIN 50 MG RE SUPP
RECTAL | Status: AC
  Filled 2018-02-12: qty 2

## 2018-02-12 MED ORDER — SODIUM CHLORIDE 0.9 % IV SOLN
1.5000 g | Freq: Once | INTRAVENOUS | Status: AC
  Administered 2018-02-12: 1.5 g via INTRAVENOUS
  Filled 2018-02-12: qty 1.5

## 2018-02-12 MED ORDER — ONDANSETRON HCL 4 MG/2ML IJ SOLN
INTRAMUSCULAR | Status: DC | PRN
  Administered 2018-02-12: 4 mg via INTRAVENOUS

## 2018-02-12 MED ORDER — LIDOCAINE HCL (CARDIAC) 20 MG/ML IV SOLN
INTRAVENOUS | Status: DC | PRN
  Administered 2018-02-12: 100 mg via INTRAVENOUS

## 2018-02-12 MED ORDER — SODIUM CHLORIDE 0.9 % IV SOLN: INTRAVENOUS | Status: DC

## 2018-02-12 MED ORDER — GLUCAGON HCL RDNA (DIAGNOSTIC) 1 MG IJ SOLR
INTRAMUSCULAR | Status: DC | PRN
  Administered 2018-02-12: 0.25 mg via INTRAVENOUS

## 2018-02-12 MED ORDER — FENTANYL CITRATE (PF) 100 MCG/2ML IJ SOLN
INTRAMUSCULAR | Status: DC | PRN
  Administered 2018-02-12: 100 ug via INTRAVENOUS

## 2018-02-12 MED ORDER — SODIUM CHLORIDE 0.9 % IV SOLN
INTRAVENOUS | Status: DC | PRN
  Administered 2018-02-12: 20 mL

## 2018-02-12 MED ORDER — ROCURONIUM BROMIDE 100 MG/10ML IV SOLN
INTRAVENOUS | Status: DC | PRN
  Administered 2018-02-12: 50 mg via INTRAVENOUS

## 2018-02-12 MED ORDER — PROPOFOL 10 MG/ML IV BOLUS
INTRAVENOUS | Status: DC | PRN
  Administered 2018-02-12: 150 mg via INTRAVENOUS

## 2018-02-12 MED ORDER — MIDAZOLAM HCL 5 MG/5ML IJ SOLN
INTRAMUSCULAR | Status: DC | PRN
  Administered 2018-02-12: 2 mg via INTRAVENOUS

## 2018-02-12 MED ORDER — INDOMETHACIN 50 MG RE SUPP
RECTAL | Status: DC | PRN
  Administered 2018-02-12: 100 mg via RECTAL

## 2018-02-12 MED ORDER — MIDAZOLAM HCL 2 MG/2ML IJ SOLN
INTRAMUSCULAR | Status: AC
  Filled 2018-02-12: qty 2

## 2018-02-12 MED ORDER — PROPOFOL 10 MG/ML IV BOLUS
INTRAVENOUS | Status: AC
  Filled 2018-02-12: qty 20

## 2018-02-12 MED ORDER — INDOMETHACIN 50 MG RE SUPP: 100.0000 mg | Freq: Once | RECTAL | Status: DC

## 2018-02-12 MED ORDER — DEXAMETHASONE SODIUM PHOSPHATE 10 MG/ML IJ SOLN
INTRAMUSCULAR | Status: DC | PRN
  Administered 2018-02-12: 10 mg via INTRAVENOUS

## 2018-02-12 MED ORDER — FENTANYL CITRATE (PF) 100 MCG/2ML IJ SOLN
INTRAMUSCULAR | Status: AC
  Filled 2018-02-12: qty 2

## 2018-02-12 MED ORDER — SUGAMMADEX SODIUM 200 MG/2ML IV SOLN
INTRAVENOUS | Status: DC | PRN
  Administered 2018-02-12: 200 mg via INTRAVENOUS

## 2018-02-12 NOTE — Op Note (Signed)
[facility] Patient Name: Travis Palmer Procedure Date: 02/12/2018 MRN: [id number] Attending MD: Ladene Artist , MD Date of Birth: 08-19-1996 CSN: [id number] Age: 22 Admit Type: Outpatient Procedure:                ERCP Indications:              Abdominal pain of suspected biliary origin, Bile                            duct stone on MRCP, Elevated LFTs Providers:                Pricilla Riffle. Fuller Plan, MD, Carolynn Comment RN, RN,                            Laurena Spies, Technician Referring MD:             [facility] Medicines:                General Anesthesia Complications:            No immediate complications. Estimated Blood Loss:     Estimated blood loss: none. Procedure:                Pre-Anesthesia Assessment:                           - Prior to the procedure, a History and Physical                            was performed, and patient medications and                            allergies were reviewed. The patient's tolerance of                            previous anesthesia was also reviewed. The risks                            and benefits of the procedure and the sedation                            options and risks were discussed with the patient.                            All questions were answered, and informed consent                            was obtained. Prior Anticoagulants: The patient has                            taken no previous anticoagulant or antiplatelet                            agents. ASA Grade Assessment: II - A patient with  mild systemic disease. After reviewing the risks                            and benefits, the patient was deemed in                            satisfactory condition to undergo the procedure.                           After obtaining informed consent, the scope was                            passed under direct vision. Throughout the                            procedure, the  patient's blood pressure, pulse, and                            oxygen saturations were monitored continuously. The                            [id number] ([id number]) scope was introduced through                            the mouth, and used to inject contrast into and                            used to inject contrast into the bile duct. The                            ERCP was accomplished without difficulty. The                            patient tolerated the procedure well. Scope In: Scope Out: Findings:      The scout film was normal. The esophagus was successfully intubated       under direct vision. The scope was advanced to the major papilla in the       descending duodenum without detailed examination of the pharynx, larynx       and associated structures, and upper GI tract. The upper GI tract was       grossly normal except for erythema at the ampullary orifice. A straight       Roadrunner wire was passed into the biliary tree. The short-nosed       traction sphincterotome was passed over the guidewire and the bile duct       was then deeply cannulated. Contrast was injected. I personally       interpreted the bile duct images. There was appropriate flow of contrast       through the ducts. The common bile duct was mildly dilated, with a stone       causing an obstruction. The largest diameter was 9 mm. An 8 mm biliary       sphincterotomy was made with a traction (standard) sphincterotome using       ERBE electrocautery. There was no post-sphincterotomy bleeding. The  biliary tree was swept with a 9 mm balloon starting at the bifurcation.       Nothing was found. Impression:               - Erythematous ampullary orifice                           - Common bile duct was mildly dilated, with a stone                            causing an obstruction per recent MRCP.                           - A biliary sphincterotomy was performed.                           - The biliary tree  was swept and nothing was found. Moderate Sedation:      N/A- Per Anesthesia Care Recommendation:           - CBD stone noted on MRCP likely passed prior to                            ERCP.                           - Avoid aspirin and nonsteroidal anti-inflammatory                            medicines for 1 week.                           - Discharge patient to home (with escort).                           - Patient has a contact number available for                            emergencies. The signs and symptoms of potential                            delayed complications were discussed with the                            patient. Return to normal activities tomorrow.                            Written discharge instructions were provided to the                            patient.                           - Outpatient GI follow up with Dr. Silverio Decamp prn.                           - Refer to a surgeon for cholecystectomy at  appointment to be scheduled. Procedure Code(s):        --- Professional ---                           [phone], Endoscopic retrograde                            cholangiopancreatography (ERCP); with                            sphincterotomy/papillotomy Diagnosis Code(s):        --- Professional ---                           K80.51, Calculus of bile duct without cholangitis                            or cholecystitis with obstruction                           R10.9, Unspecified abdominal pain CPT copyright 2016 American Medical Association. All rights reserved. The codes documented in this report are preliminary and upon coder review may  be revised to meet current compliance requirements. Ladene Artist, MD 02/12/2018 2:04:59 PM This report has been signed electronically. Number of Addenda: 0

## 2018-02-12 NOTE — Discharge Instructions (Signed)
YOU HAD AN ENDOSCOPIC PROCEDURE TODAY: Refer to the procedure report and other information in the discharge instructions given to you for any specific questions about what was found during the examination. If this information does not answer your questions, please call [place] office at [phone] to clarify.  ° °YOU SHOULD EXPECT: Some feelings of bloating in the abdomen. Passage of more gas than usual. Walking can help get rid of the air that was put into your GI tract during the procedure and reduce the bloating. If you had a lower endoscopy (such as a colonoscopy or flexible sigmoidoscopy) you may notice spotting of blood in your stool or on the toilet paper. Some abdominal soreness may be present for a day or two, also. ° °DIET: Your first meal following the procedure should be a light meal and then it is ok to progress to your normal diet. A half-sandwich or bowl of soup is an example of a good first meal. Heavy or fried foods are harder to digest and may make you feel nauseous or bloated. Drink plenty of fluids but you should avoid alcoholic beverages for 24 hours. If you had a esophageal dilation, please see attached instructions for diet.   ° °ACTIVITY: Your care partner should take you home directly after the procedure. You should plan to take it easy, moving slowly for the rest of the day. You can resume normal activity the day after the procedure however YOU SHOULD NOT DRIVE, use power tools, machinery or perform tasks that involve climbing or major physical exertion for 24 hours (because of the sedation medicines used during the test).  ° °SYMPTOMS TO REPORT IMMEDIATELY: °A gastroenterologist can be reached at any hour. Please call [phone]  for any of the following symptoms:  °Following lower endoscopy (colonoscopy, flexible sigmoidoscopy) °Excessive amounts of blood in the stool  °Significant tenderness, worsening of abdominal pains  °Swelling of the abdomen that is new, acute  °Fever of 100° or  higher  °Following upper endoscopy (EGD, EUS, ERCP, esophageal dilation) °Vomiting of blood or coffee ground material  °New, significant abdominal pain  °New, significant chest pain or pain under the shoulder blades  °Painful or persistently difficult swallowing  °New shortness of breath  °Black, tarry-looking or red, bloody stools ° °FOLLOW UP:  °If any biopsies were taken you will be contacted by phone or by letter within the next 1-3 weeks. Call [phone]  if you have not heard about the biopsies in 3 weeks.  °Please also call with any specific questions about appointments or follow up tests. ° °

## 2018-02-12 NOTE — Anesthesia Preprocedure Evaluation (Addendum)
Anesthesia Evaluation  Patient identified by MRN, date of birth, ID band Patient awake    Reviewed: Allergy & Precautions, NPO status , Patient's Chart, lab work & pertinent test results  Airway Mallampati: II  TM Distance: >3 FB Neck ROM: Full    Dental  (+) Dental Advisory Given   Pulmonary Current Smoker,    breath sounds clear to auscultation       Cardiovascular negative cardio ROS   Rhythm:Regular Rate:Normal     Neuro/Psych negative neurological ROS     GI/Hepatic negative GI ROS, Neg liver ROS,   Endo/Other  negative endocrine ROS  Renal/GU negative Renal ROS     Musculoskeletal   Abdominal   Peds  Hematology  (+) HIV,   Anesthesia Other Findings   Reproductive/Obstetrics                             Lab Results  Component Value Date   WBC 3.2 (L) 02/10/2018   HGB 12.4 (L) 02/10/2018   HCT 35.6 (L) 02/10/2018   MCV 95.4 02/10/2018   PLT 265 02/10/2018   Lab Results  Component Value Date   CREATININE 0.83 02/10/2018   BUN 9 02/10/2018   NA 138 02/10/2018   K 3.8 02/10/2018   CL 107 02/10/2018   CO2 24 02/10/2018    Anesthesia Physical Anesthesia Plan  ASA: II  Anesthesia Plan: General   Post-op Pain Management:    Induction: Intravenous  PONV Risk Score and Plan: 1 and Dexamethasone, Ondansetron and Treatment may vary due to age or medical condition  Airway Management Planned: Oral ETT  Additional Equipment:   Intra-op Plan:   Post-operative Plan: Extubation in OR  Informed Consent: I have reviewed the patients History and Physical, chart, labs and discussed the procedure including the risks, benefits and alternatives for the proposed anesthesia with the patient or authorized representative who has indicated his/her understanding and acceptance.   Dental advisory given  Plan Discussed with: CRNA  Anesthesia Plan Comments:         Anesthesia  Quick Evaluation

## 2018-02-12 NOTE — Anesthesia Procedure Notes (Signed)
Procedure Name: Intubation Date/Time: 02/12/2018 1:24 PM Performed by: Glory Buff, CRNA Pre-anesthesia Checklist: Patient identified, Emergency Drugs available, Suction available and Patient being monitored Patient Re-evaluated:Patient Re-evaluated prior to induction Oxygen Delivery Method: Circle system utilized Preoxygenation: Pre-oxygenation with 100% oxygen Induction Type: IV induction Ventilation: Mask ventilation without difficulty Laryngoscope Size: Miller and 3 Grade View: Grade I Tube type: Oral Tube size: 7.5 mm Number of attempts: 1 Airway Equipment and Method: Stylet and Oral airway Placement Confirmation: ETT inserted through vocal cords under direct vision,  positive ETCO2 and breath sounds checked- equal and bilateral Secured at: 23 cm Tube secured with: Tape Dental Injury: Teeth and Oropharynx as per pre-operative assessment

## 2018-02-12 NOTE — Interval H&P Note (Signed)
History and Physical Interval Note:  02/12/2018 1:02 PM  Travis Palmer  has presented today for surgery, with the diagnosis of cbd stone  The various methods of treatment have been discussed with the patient and family. After consideration of risks, benefits and other options for treatment, the patient has consented to  Procedure(s): ENDOSCOPIC RETROGRADE CHOLANGIOPANCREATOGRAPHY (ERCP) WITH PROPOFOL (N/A) as a surgical intervention .  The patient's history has been reviewed, patient examined, no change in status, stable for surgery.  I have reviewed the patient's chart and labs.  Questions were answered to the patient's satisfaction.     Pricilla Riffle. Fuller Plan

## 2018-02-12 NOTE — Transfer of Care (Signed)
Immediate Anesthesia Transfer of Care Note  Patient: Travis Palmer  Procedure(s) Performed: ENDOSCOPIC RETROGRADE CHOLANGIOPANCREATOGRAPHY (ERCP) WITH PROPOFOL (N/A )  Patient Location: PACU  Anesthesia Type:General  Level of Consciousness: awake, alert  and oriented  Airway & Oxygen Therapy: Patient Spontanous Breathing and Patient connected to face mask oxygen  Post-op Assessment: Report given to RN and Post -op Vital signs reviewed and stable  Post vital signs: Reviewed and stable  Last Vitals:  Vitals:   02/12/18 1227  BP: 121/69  Pulse: (!) 57  Resp: 19  Temp: 36.8 C  SpO2: 99%    Last Pain:  Vitals:   02/12/18 1227  TempSrc: Oral         Complications: No apparent anesthesia complications

## 2018-02-13 LAB — URINE DRUGS OF ABUSE SCREEN W ALC, ROUTINE (REF LAB)
Amphetamines, Urine: NEGATIVE ng/mL
BENZODIAZEPINE QUANT UR: NEGATIVE ng/mL
Barbiturate, Ur: NEGATIVE ng/mL
COCAINE (METAB.): NEGATIVE ng/mL
Ethanol U, Quan: NEGATIVE %
METHADONE SCREEN, URINE: NEGATIVE ng/mL
OPIATE QUANT UR: NEGATIVE ng/mL
PHENCYCLIDINE, UR: NEGATIVE ng/mL
Propoxyphene, Urine: NEGATIVE ng/mL

## 2018-02-13 LAB — PANEL 799049: CANNABINOID GC/MS UR: POSITIVE — AB

## 2018-02-16 NOTE — Anesthesia Postprocedure Evaluation (Signed)
Anesthesia Post Note  Patient: Travis Palmer  Procedure(s) Performed: ENDOSCOPIC RETROGRADE CHOLANGIOPANCREATOGRAPHY (ERCP) WITH PROPOFOL (N/A )     Patient location during evaluation: PACU Anesthesia Type: General Level of consciousness: awake and alert Pain management: pain level controlled Vital Signs Assessment: post-procedure vital signs reviewed and stable Respiratory status: spontaneous breathing, nonlabored ventilation, respiratory function stable and patient connected to nasal cannula oxygen Cardiovascular status: blood pressure returned to baseline and stable Postop Assessment: no apparent nausea or vomiting Anesthetic complications: no    Last Vitals:  Vitals:   02/12/18 1420 02/12/18 1430  BP: 130/90 131/79  Pulse: 63 61  Resp: 15 14  Temp:    SpO2: 99% 100%    Last Pain:  Vitals:   02/12/18 1407  TempSrc: Oral                 Tiajuana Amass

## 2018-03-23 ENCOUNTER — Other Ambulatory Visit: Payer: Self-pay | Admitting: Infectious Disease

## 2018-03-23 DIAGNOSIS — B20 Human immunodeficiency virus [HIV] disease: Secondary | ICD-10-CM

## 2018-08-10 ENCOUNTER — Other Ambulatory Visit: Payer: Self-pay | Admitting: Infectious Disease

## 2018-08-10 DIAGNOSIS — F640 Transsexualism: Secondary | ICD-10-CM

## 2018-08-10 DIAGNOSIS — Z789 Other specified health status: Secondary | ICD-10-CM

## 2018-08-12 ENCOUNTER — Other Ambulatory Visit: Payer: Self-pay | Admitting: Infectious Disease

## 2018-08-12 DIAGNOSIS — Z789 Other specified health status: Secondary | ICD-10-CM

## 2018-08-12 DIAGNOSIS — F640 Transsexualism: Secondary | ICD-10-CM

## 2018-10-26 ENCOUNTER — Telehealth: Payer: Self-pay | Admitting: *Deleted

## 2018-10-26 NOTE — Telephone Encounter (Signed)
Patient left message with Pharmacy stating she couldn't get medication. Patient has refills at pharmacy, but has not renewed her ADAP/RW.  She will come tomorrow for financial counseling. She will need to make appointments for labs and follow up as well, as she is overdue for follow up with Dr Hubert Azure, Lanice Schwab, RN

## 2018-10-27 ENCOUNTER — Ambulatory Visit: Payer: Self-pay

## 2018-10-28 ENCOUNTER — Encounter: Payer: Self-pay | Admitting: Infectious Disease

## 2018-11-23 ENCOUNTER — Other Ambulatory Visit: Payer: Self-pay

## 2018-11-23 DIAGNOSIS — B20 Human immunodeficiency virus [HIV] disease: Secondary | ICD-10-CM

## 2018-11-23 DIAGNOSIS — A539 Syphilis, unspecified: Secondary | ICD-10-CM

## 2018-11-24 LAB — T-HELPER CELL (CD4) - (RCID CLINIC ONLY)
CD4 % Helper T Cell: 34 % (ref 33–55)
CD4 T Cell Abs: 640 /uL (ref 400–2700)

## 2018-11-25 LAB — CBC WITH DIFFERENTIAL/PLATELET
ABSOLUTE MONOCYTES: 339 {cells}/uL (ref 200–950)
Basophils Absolute: 39 cells/uL (ref 0–200)
Basophils Relative: 1 %
EOS ABS: 59 {cells}/uL (ref 15–500)
EOS PCT: 1.5 %
HEMATOCRIT: 39.9 % (ref 38.5–50.0)
Hemoglobin: 13.6 g/dL (ref 13.2–17.1)
Lymphs Abs: 1966 cells/uL (ref 850–3900)
MCH: 34.3 pg — ABNORMAL HIGH (ref 27.0–33.0)
MCHC: 34.1 g/dL (ref 32.0–36.0)
MCV: 100.8 fL — ABNORMAL HIGH (ref 80.0–100.0)
MPV: 9.7 fL (ref 7.5–12.5)
Monocytes Relative: 8.7 %
NEUTROS ABS: 1498 {cells}/uL — AB (ref 1500–7800)
Neutrophils Relative %: 38.4 %
PLATELETS: 310 10*3/uL (ref 140–400)
RBC: 3.96 10*6/uL — AB (ref 4.20–5.80)
RDW: 11.7 % (ref 11.0–15.0)
TOTAL LYMPHOCYTE: 50.4 %
WBC: 3.9 10*3/uL (ref 3.8–10.8)

## 2018-11-25 LAB — COMPLETE METABOLIC PANEL WITH GFR
AG Ratio: 1.8 (calc) (ref 1.0–2.5)
ALT: 13 U/L (ref 9–46)
AST: 16 U/L (ref 10–40)
Albumin: 4.4 g/dL (ref 3.6–5.1)
Alkaline phosphatase (APISO): 35 U/L — ABNORMAL LOW (ref 40–115)
BUN: 9 mg/dL (ref 7–25)
CO2: 25 mmol/L (ref 20–32)
Calcium: 9.2 mg/dL (ref 8.6–10.3)
Chloride: 109 mmol/L (ref 98–110)
Creat: 0.85 mg/dL (ref 0.60–1.35)
GFR, Est African American: 141 mL/min/{1.73_m2} (ref >=60)
GFR, Est Non African American: 122 mL/min/{1.73_m2} (ref >=60)
Globulin: 2.4 g/dL (calc) (ref 1.9–3.7)
Glucose, Bld: 86 mg/dL (ref 65–99)
POTASSIUM: 4.2 mmol/L (ref 3.5–5.3)
Sodium: 140 mmol/L (ref 135–146)
Total Bilirubin: 0.4 mg/dL (ref 0.2–1.2)
Total Protein: 6.8 g/dL (ref 6.1–8.1)

## 2018-11-25 LAB — RPR: RPR Ser Ql: NONREACTIVE

## 2018-11-25 LAB — HIV-1 RNA QUANT-NO REFLEX-BLD
HIV 1 RNA Quant: 127 copies/mL — ABNORMAL HIGH
HIV-1 RNA Quant, Log: 2.1 Log copies/mL — ABNORMAL HIGH

## 2018-12-10 ENCOUNTER — Ambulatory Visit: Payer: Self-pay | Admitting: Infectious Disease

## 2018-12-10 ENCOUNTER — Ambulatory Visit: Payer: Self-pay

## 2018-12-23 ENCOUNTER — Ambulatory Visit: Payer: Self-pay

## 2018-12-23 ENCOUNTER — Ambulatory Visit: Payer: Self-pay | Admitting: Infectious Disease

## 2018-12-23 ENCOUNTER — Telehealth: Payer: Self-pay

## 2018-12-23 NOTE — Telephone Encounter (Signed)
Thank you I wonder why she kept making appts with [place] in the meantime. Maybe sudden move to [place]?

## 2018-12-23 NOTE — Telephone Encounter (Signed)
Patient called to cancel appointment. Patient states she is now living in [place] with her sister and will be transferring her care once she locates a clinic nearby. Patient asked about [facility] transferring to new clinic. LPN advised patient that [facility] program will not transfer from state to state and she will have to apply once she finds new clinic. LPN provided patient with [facility] address/phone/fax to have available once she finds a new clinic to fax [facility] medical record release form to release records to new clinic.  S.Shirelle Tootle, LPN

## 2019-02-06 ENCOUNTER — Other Ambulatory Visit: Payer: Self-pay | Admitting: Infectious Disease

## 2019-02-06 DIAGNOSIS — B20 Human immunodeficiency virus [HIV] disease: Secondary | ICD-10-CM

## 2019-02-06 DIAGNOSIS — F640 Transsexualism: Secondary | ICD-10-CM

## 2019-02-06 DIAGNOSIS — Z789 Other specified health status: Secondary | ICD-10-CM

## 2019-02-07 IMAGING — MR MR 3D RECON AT SCANNER
11 series · 16 of 16 positions shown · non-contrast
Comparison: Right upper quadrant ultrasound dated 02/09/2018

CLINICAL DATA: Abdominal pain, cholelithiasis, dilated CBD on
ultrasound.

EXAM:
MRI ABDOMEN WITHOUT CONTRAST  (INCLUDING MRCP)
TECHNIQUE: Multiplanar multisequence MR imaging of the abdomen was performed.
Heavily T2-weighted images of the biliary and pancreatic ducts were
obtained, and three-dimensional MRCP images were rendered by post
processing.

[Series 3: T2 fat-sat · axial · 5.0mm · 0.78mm/px · 1 of 53 slices shown]
[im 1/53]
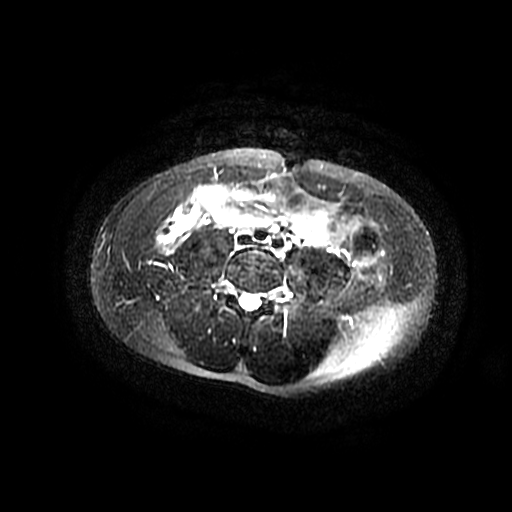

[Series 4: MRCP · coronal · 1.8mm · 0.62mm/px · 2 of 117 slices shown (1 of 3)]
[im 1/117]
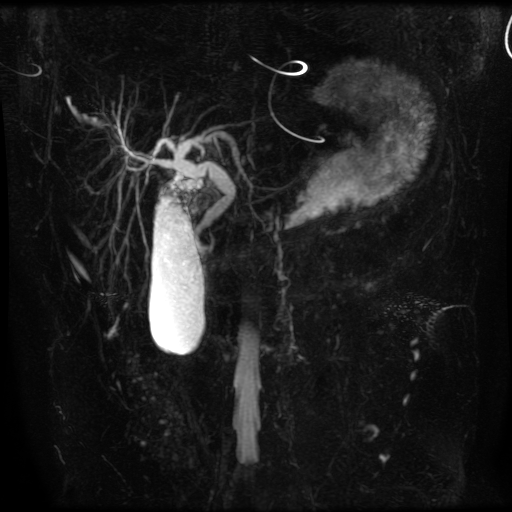
[im 117/117]
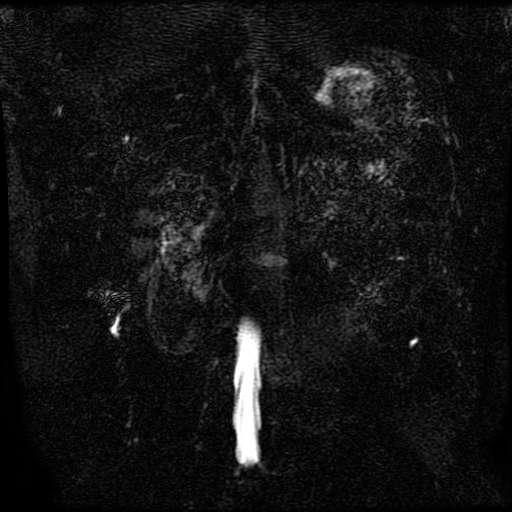

[Series 5: MRCP · coronal · 2.0mm · 0.70mm/px · 1 of 53 slices shown (2 of 3)]
[im 1/53]
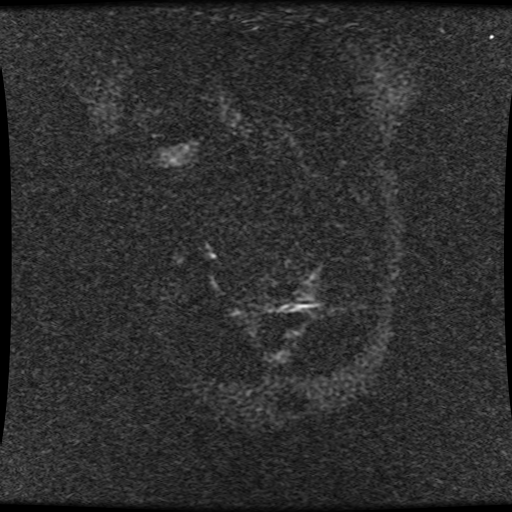

[Series 6: DWI b500 · axial · 6.0mm · 1.48mm/px · 1 of 72 slices shown]
[im 1/72]
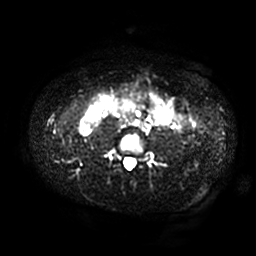

[Series 7: ax dualecho · axial · 5.0mm · 0.72mm/px · z∈[-70,+175]mm · 2 of 100 slices shown]
[im 1/100]
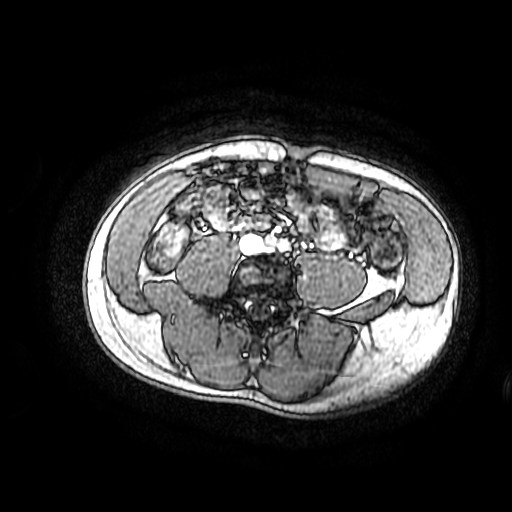
[im 100/100]
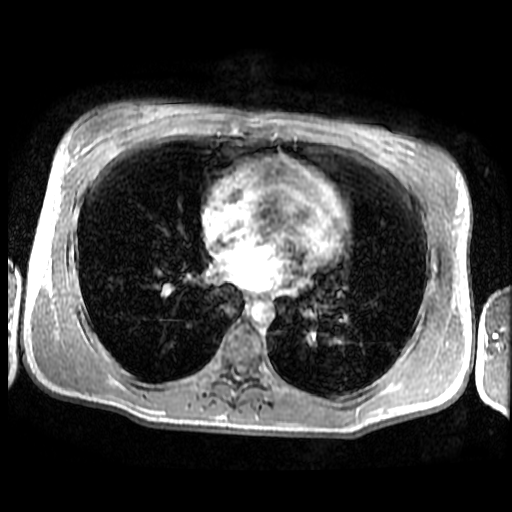

[Series 8: MRCP · sagittal · 40.0mm · 0.70mm/px · 1 of 9 slices shown (3 of 3)]
[im 1/9]
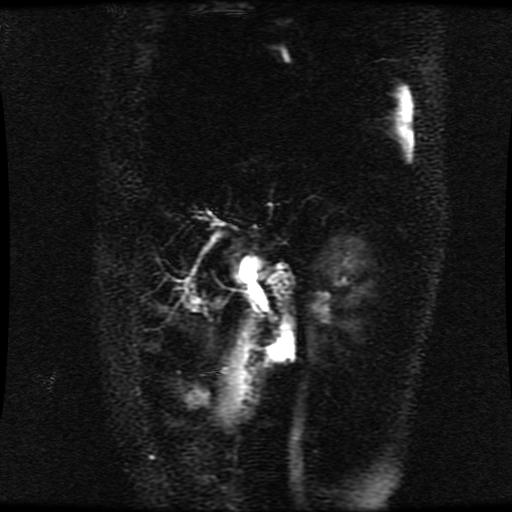

[Series 9: bSSFP fat-sat · coronal · 5.0mm · 0.70mm/px · 1 of 40 slices shown]
[im 1/40]
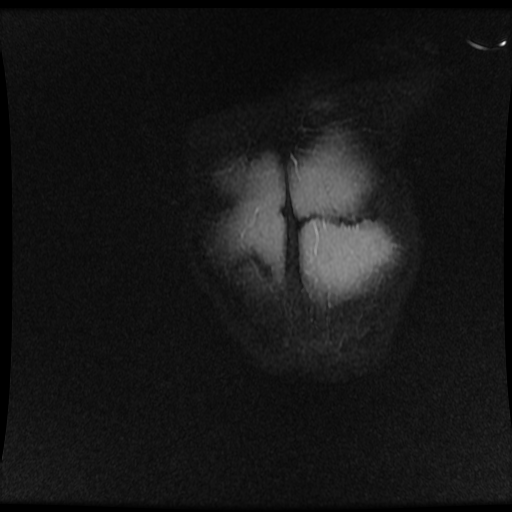

[Series 10: T2 · axial · 5.0mm · 0.72mm/px · 1 of 50 slices shown]
[im 1/50]
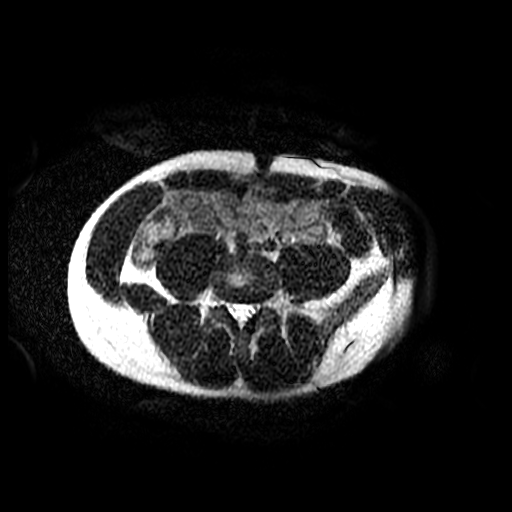

[Series 400: reformatted · axial · 1.8mm · 0.62mm/px · z∈[+75,+204]mm · 3 of 130 slices shown (1 of 2)]
[im 1/130]
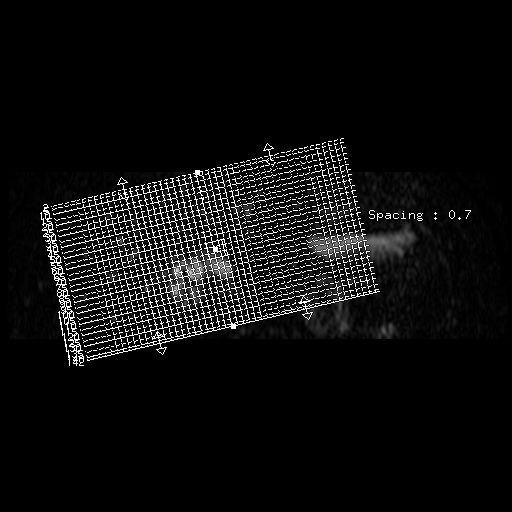
[im 65/130]
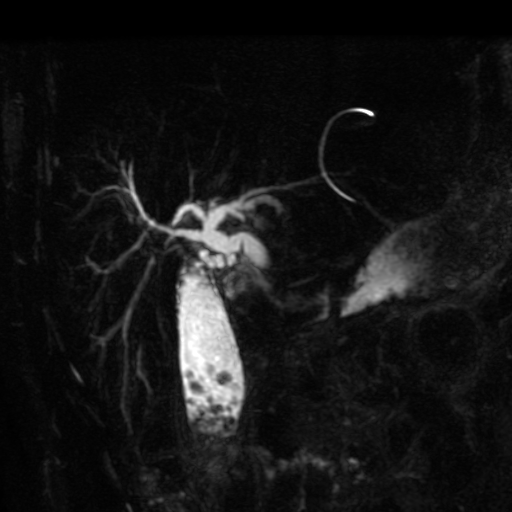
[im 130/130]
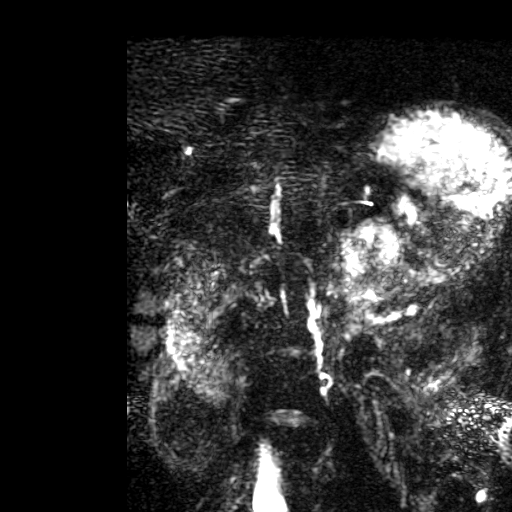

[Series 401: reformatted · axial · 1.8mm · 0.62mm/px · z∈[+75,+204]mm · 2 of 79 slices shown (2 of 2)]
[im 1/79]
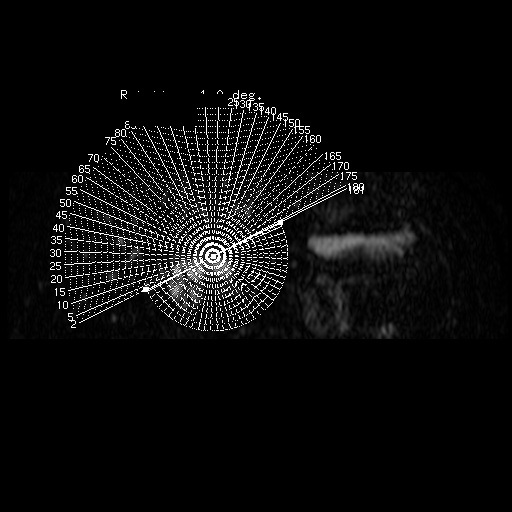
[im 79/79]
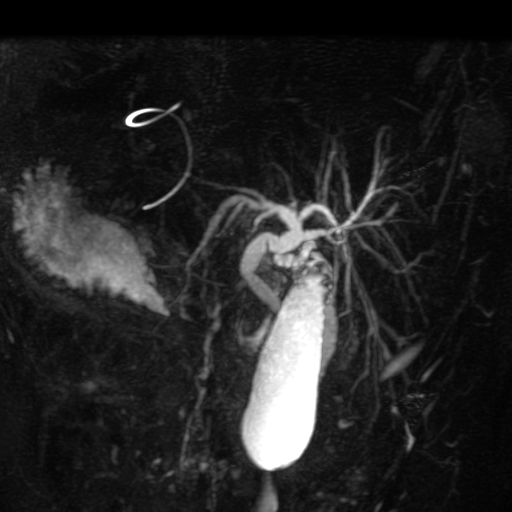

[Series 600: DWI · axial · 6.0mm · 1.48mm/px · 1 of 36 slices shown]
[im 1/36]
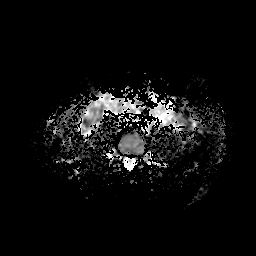

[16 of 16 positions shown; findings below may reference images not displayed]

FINDINGS: Motion degraded images.

Lower chest: Lung bases are clear.

Hepatobiliary: Liver is within normal limits.  No hepatic steatosis.

Numerous layering gallstones (series 10/image 29), without
gallbladder wall thickening or associated inflammatory changes.

Mild intrahepatic ductal dilatation. Dilated common duct, measuring
9 mm. Associated 7 mm distal CBD stone (series 9/image 20).

Pancreas:  Within normal limits.

Spleen:  Within normal limits.

Adrenals/Urinary Tract:  Adrenal glands are within normal limits.

Kidneys are within normal limits.  No hydronephrosis.

Stomach/Bowel: Stomach is within normal limits.

Visualized bowel is unremarkable.

Vascular/Lymphatic:  No evidence of abdominal aortic aneurysm.

No suspicious abdominal lymphadenopathy.

Other:  No abdominal ascites.

Musculoskeletal: No focal osseous lesions.
IMPRESSION: Cholelithiasis, without associated inflammatory changes to suggest
acute cholecystitis.

Mild intrahepatic and extrahepatic ductal dilatation. Dilated common
duct, measuring 9 mm.

Choledocholithiasis with associated 7 mm distal CBD stone. ERCP is
suggested.

## 2019-03-02 ENCOUNTER — Encounter: Payer: Self-pay | Admitting: Infectious Disease

## 2019-03-03 ENCOUNTER — Other Ambulatory Visit: Payer: Self-pay | Admitting: Behavioral Health

## 2019-03-03 ENCOUNTER — Ambulatory Visit: Payer: Self-pay | Admitting: Infectious Diseases

## 2019-03-03 ENCOUNTER — Encounter: Payer: Self-pay | Admitting: Infectious Diseases

## 2019-03-03 ENCOUNTER — Telehealth: Payer: Self-pay | Admitting: Behavioral Health

## 2019-03-03 DIAGNOSIS — B20 Human immunodeficiency virus [HIV] disease: Secondary | ICD-10-CM

## 2019-03-03 MED ORDER — BICTEGRAVIR-EMTRICITAB-TENOFOV 50-200-25 MG PO TABS: 1.0000 | ORAL_TABLET | Freq: Every day | ORAL | 0 refills | Status: DC

## 2019-03-03 NOTE — Telephone Encounter (Signed)
Patient called today stating she is an hour away from the office and will arrive late.  Explained to her that Dr. Tommy Medal would not be available at that time and she would have to reschedule.  Patient was upset that she could not be accommodated.  Patient states it is ridiculous that she cannot just come in when she needs assistance.  Patient states she is out of her medications for 1 week, informed her that a month supply of Biktarvy was sent to [facility] on [address].  Patient states she is worried about her hormones that she is out of.  Informed her that she needs to come to an appointment so she can get lab work because labs need to be closely monitored with hormones.  Rescheduled patients appointment and she states she will come on Friday. Per previous notes patient had moved to [place] but she is stating that was not true and states she is in [place].  Explained the importance of coming to appointments and getting lab work done.  Patient verbalized understanding. Pricilla Riffle RN

## 2019-03-05 ENCOUNTER — Encounter: Payer: Self-pay | Admitting: Infectious Disease

## 2019-03-05 ENCOUNTER — Ambulatory Visit (INDEPENDENT_AMBULATORY_CARE_PROVIDER_SITE_OTHER): Payer: Self-pay | Admitting: Infectious Disease

## 2019-03-05 ENCOUNTER — Other Ambulatory Visit: Payer: Self-pay

## 2019-03-05 DIAGNOSIS — A549 Gonococcal infection, unspecified: Secondary | ICD-10-CM

## 2019-03-05 DIAGNOSIS — A749 Chlamydial infection, unspecified: Secondary | ICD-10-CM

## 2019-03-05 DIAGNOSIS — A539 Syphilis, unspecified: Secondary | ICD-10-CM

## 2019-03-05 DIAGNOSIS — Z789 Other specified health status: Secondary | ICD-10-CM

## 2019-03-05 DIAGNOSIS — Z113 Encounter for screening for infections with a predominantly sexual mode of transmission: Secondary | ICD-10-CM

## 2019-03-05 DIAGNOSIS — F640 Transsexualism: Secondary | ICD-10-CM

## 2019-03-05 DIAGNOSIS — B20 Human immunodeficiency virus [HIV] disease: Secondary | ICD-10-CM

## 2019-03-05 DIAGNOSIS — K8051 Calculus of bile duct without cholangitis or cholecystitis with obstruction: Secondary | ICD-10-CM

## 2019-03-05 MED ORDER — BICTEGRAVIR-EMTRICITAB-TENOFOV 50-200-25 MG PO TABS: 1.0000 | ORAL_TABLET | Freq: Every day | ORAL | 11 refills | Status: DC

## 2019-03-05 MED ORDER — ESTRADIOL 2 MG PO TABS: ORAL_TABLET | ORAL | 11 refills | Status: DC

## 2019-03-05 MED ORDER — SPIRONOLACTONE 100 MG PO TABS: 100.0000 mg | ORAL_TABLET | Freq: Every day | ORAL | 11 refills | Status: DC

## 2019-03-05 NOTE — Progress Notes (Signed)
Virtual Visit via Telephone Note  I connected with Travis Palmer on 03/05/19 at  9:15 AM EDT by telephone and verified that I am speaking with the correct person using two identifiers.   I discussed the limitations, risks, security and privacy concerns of performing an evaluation and management service by telephone and the availability of in person appointments. I also discussed with the patient that there may be a patient responsible charge related to this service. The patient expressed understanding and agreed to proceed.   History of Present Illness:  Travis Palmer is a 23-year-old transgender male with HIV that has been better controlled recently though her last viral load was 125 copies.  She was unfortunately laid on renewal of her HIV medication assistance program but now has that renewed into September.  She says she is taking her Biktarvy faithfully.  She also is taking Aldactone along with estrogen though she says she is run out of those medicines after this final bottle.  Since I last saw her she underwent an ERCP due to a dilated common bile duct in the context of nominal pain and obstructive choledocholithiasis.  Common bile duct was dilated and stone removed and a sphincterotomy performed.  Today she had no complaints other than desire to increase her estrogen dose.  I told her I would feel more comfortable doing that in the context of her having labs performed.  He also inquired about obtaining surgery to be castrated and to help her transition to a transgender woman.  She was concerned that she might not build to have this done without her having insurance.  I told her I would try to investigate this and find out more information if there are places such as for example [facility] who treat uninsured patients and help him with her transition.   Past Medical History:  Diagnosis Date  . Epigastric pain 10/01/2017  . History of bipolar disorder   . HIV infection ([place])   .  Housing problems 08/26/2016  . Routine screening for STI (sexually transmitted infection) 01/26/2018  . Syphilis 08/02/2015    Past Surgical History:  Procedure Laterality Date  . ENDOSCOPIC RETROGRADE CHOLANGIOPANCREATOGRAPHY (ERCP) WITH PROPOFOL N/A 02/12/2018   Procedure: ENDOSCOPIC RETROGRADE CHOLANGIOPANCREATOGRAPHY (ERCP) WITH PROPOFOL;  Surgeon: Ladene Artist, MD;  Location: [facility];  Service: Endoscopy;  Laterality: N/A;  . WISDOM TOOTH EXTRACTION  2014    Family History  Problem Relation Age of Onset  . Lactose intolerance Mother   . Cancer Maternal Aunt   . Hypertension Maternal Grandmother       Social History   Socioeconomic History  . Marital status: Single    Spouse name: Not on file  . Number of children: Not on file  . Years of education: Not on file  . Highest education level: Not on file  Occupational History  . Not on file  Social Needs  . Financial resource strain: Not on file  . Food insecurity:    Worry: Not on file    Inability: Not on file  . Transportation needs:    Medical: Not on file    Non-medical: Not on file  Tobacco Use  . Smoking status: Current Some Day Smoker    Packs/day: 0.00    Types: Cigarettes  . Smokeless tobacco: Never Used  Substance and Sexual Activity  . Alcohol use: Yes    Alcohol/week: 0.0 standard drinks    Comment: weekend drinker  . Drug use: Yes  Frequency: 7.0 times per week    Types: Marijuana    Comment: every other day  . Sexual activity: Not Currently    Comment: given condoms  Lifestyle  . Physical activity:    Days per week: Not on file    Minutes per session: Not on file  . Stress: Not on file  Relationships  . Social connections:    Talks on phone: Not on file    Gets together: Not on file    Attends religious service: Not on file    Active member of club or organization: Not on file    Attends meetings of clubs or organizations: Not on file    Relationship status: Not on file  Other  Topics Concern  . Not on file  Social History Narrative  . Not on file    Allergies  Allergen Reactions  . Apple Itching, Swelling and Other (See Comments)    Mouth swells  . Depakote [Divalproex Sodium] Other (See Comments)    Hospitalized for 3 days for extreme GI upset because of this     Current Outpatient Medications:  .  bictegravir-emtricitabine-tenofovir AF (BIKTARVY) 50-200-25 MG TABS tablet, Take 1 tablet by mouth daily., Disp: 30 tablet, Rfl: 11 .  doxycycline (VIBRA-TABS) 100 MG tablet, Take two tablets within 24-73 hours of sex to prevent STI, Disp: 60 tablet, Rfl: 0 .  estradiol (ESTRACE) 2 MG tablet, Take 2 tablets in the morning and 1 at night, Disp: 90 tablet, Rfl: 11 .  spironolactone (ALDACTONE) 100 MG tablet, Take 1 tablet (100 mg total) by mouth daily., Disp: 30 tablet, Rfl: 11  Otherwise she is doing well and her 12 point review of systems is negative.   Observations/Objective:  HIV disease relatively well controlled:  Transgender identity on Aldactone and or hormonal therapy.  [name] desiring surgery ultimately  Syphilis gonorrhea and Chlamydia: Continue to screen her for these STIs as she is high risk for these.   Assessment and Plan: HIV disease continue Biktarvy and I sent in prescription she needs to renew her HIV medication assistance program in July or August.  She of sexually-transmitted infections tested for syphilis gonorrhea chlamydia and especially make sure we check extra genital sites  Gender identity: Continue current medications we will see if there is a place that we will treat her surgically without insurance if not it would be good idea if she can somehow get insurance.    Follow Up Instructions:    I discussed the assessment and treatment plan with the patient. The patient was provided an opportunity to ask questions and all were answered. The patient agreed with the plan and demonstrated an understanding of the instructions.    The patient was advised to call back or seek an in-person evaluation if the symptoms worsen or if the condition fails to improve as anticipated.  I provided 27 minutes of non-face-to-face time during this encounter.   Alcide Evener, MD

## 2019-07-28 ENCOUNTER — Encounter: Payer: Self-pay | Admitting: Infectious Disease

## 2019-07-28 ENCOUNTER — Other Ambulatory Visit: Payer: Self-pay

## 2019-07-28 ENCOUNTER — Ambulatory Visit (INDEPENDENT_AMBULATORY_CARE_PROVIDER_SITE_OTHER): Payer: Self-pay | Admitting: Infectious Disease

## 2019-07-28 ENCOUNTER — Ambulatory Visit: Payer: Self-pay | Admitting: Infectious Disease

## 2019-07-28 VITALS — BP 126/78 | HR 81 | Temp 97.9°F

## 2019-07-28 DIAGNOSIS — B20 Human immunodeficiency virus [HIV] disease: Secondary | ICD-10-CM

## 2019-07-28 DIAGNOSIS — Z789 Other specified health status: Secondary | ICD-10-CM

## 2019-07-28 DIAGNOSIS — A549 Gonococcal infection, unspecified: Secondary | ICD-10-CM

## 2019-07-28 DIAGNOSIS — F640 Transsexualism: Secondary | ICD-10-CM

## 2019-07-28 DIAGNOSIS — A539 Syphilis, unspecified: Secondary | ICD-10-CM

## 2019-07-28 DIAGNOSIS — K838 Other specified diseases of biliary tract: Secondary | ICD-10-CM

## 2019-07-28 DIAGNOSIS — A749 Chlamydial infection, unspecified: Secondary | ICD-10-CM

## 2019-07-28 MED ORDER — DOXYCYCLINE HYCLATE 100 MG PO TABS: ORAL_TABLET | ORAL | 0 refills | Status: DC

## 2019-07-28 MED ORDER — CEFTRIAXONE SODIUM 250 MG IJ SOLR
250.0000 mg | Freq: Once | INTRAMUSCULAR | Status: AC
  Administered 2019-07-28: 250 mg via INTRAMUSCULAR

## 2019-07-28 MED ORDER — AZITHROMYCIN 250 MG PO TABS
1000.0000 mg | ORAL_TABLET | Freq: Once | ORAL | Status: AC
  Administered 2019-07-28: 1000 mg via ORAL

## 2019-07-28 NOTE — Progress Notes (Signed)
Subjective:   Chief complaint: She would like to be on injectable estrogen rather than pills in terms of reducing pill burden    Patient ID: Travis Palmer, adult    DOB: [birth year], 23 y.o.   MRN: [id number]  HPI  Travis Palmer  returns for followup for HIV care. [name] on BIKTARVY and VL has been suppressed though not had any labs since December.  I did have a visit with her virtually in March.   She had apparently and upon exposure to gonorrhea we will treat for this today as well as testing for gonorrhea and chlamydia and genital and extra genital sites as well as checking for syphilis and all of her other labs.  He is still talk about moving to [place] but not going to do that yet.  She claims to be taking all her medications including the higher dose of spironolactone.  He would like to change to an injectable formulation of estrogen because she does not like the number of pills that she is currently taking.       Past Medical History:  Diagnosis Date  . Epigastric pain 10/01/2017  . History of bipolar disorder   . HIV infection ([place])   . Housing problems 08/26/2016  . Routine screening for STI (sexually transmitted infection) 01/26/2018  . Syphilis 08/02/2015    Past Surgical History:  Procedure Laterality Date  . ENDOSCOPIC RETROGRADE CHOLANGIOPANCREATOGRAPHY (ERCP) WITH PROPOFOL N/A 02/12/2018   Procedure: ENDOSCOPIC RETROGRADE CHOLANGIOPANCREATOGRAPHY (ERCP) WITH PROPOFOL;  Surgeon: Ladene Artist, MD;  Location: [facility];  Service: Endoscopy;  Laterality: N/A;  . WISDOM TOOTH EXTRACTION  2014    Family History  Problem Relation Age of Onset  . Lactose intolerance Mother   . Cancer Maternal Aunt   . Hypertension Maternal Grandmother       Social History   Socioeconomic History  . Marital status: Single    Spouse name: Not on file  . Number of children: Not on file  . Years of education: Not on file  . Highest education level: Not on file  Occupational  History  . Not on file  Social Needs  . Financial resource strain: Not on file  . Food insecurity    Worry: Not on file    Inability: Not on file  . Transportation needs    Medical: Not on file    Non-medical: Not on file  Tobacco Use  . Smoking status: Former Smoker    Packs/day: 0.00    Types: Cigarettes  . Smokeless tobacco: Never Used  Substance and Sexual Activity  . Alcohol use: Yes    Alcohol/week: 0.0 standard drinks    Comment: weekend drinker  . Drug use: Yes    Frequency: 7.0 times per week    Types: Marijuana    Comment: every other day  . Sexual activity: Not Currently    Comment: given condoms  Lifestyle  . Physical activity    Days per week: Not on file    Minutes per session: Not on file  . Stress: Not on file  Relationships  . Social Herbalist on phone: Not on file    Gets together: Not on file    Attends religious service: Not on file    Active member of club or organization: Not on file    Attends meetings of clubs or organizations: Not on file    Relationship status: Not on file  Other Topics Concern  . Not  on file  Social History Narrative  . Not on file    Allergies  Allergen Reactions  . Apple Itching, Swelling and Other (See Comments)    Mouth swells  . Depakote [Divalproex Sodium] Other (See Comments)    Hospitalized for 3 days for extreme GI upset because of this     Current Outpatient Medications:  .  bictegravir-emtricitabine-tenofovir AF (BIKTARVY) 50-200-25 MG TABS tablet, Take 1 tablet by mouth daily., Disp: 30 tablet, Rfl: 11 .  estradiol (ESTRACE) 2 MG tablet, Take 2 tablets in the morning and 1 at night, Disp: 90 tablet, Rfl: 11 .  spironolactone (ALDACTONE) 100 MG tablet, Take 1 tablet (100 mg total) by mouth daily., Disp: 30 tablet, Rfl: 11 .  doxycycline (VIBRA-TABS) 100 MG tablet, Take two tablets within 24-73 hours of sex to prevent STI (Patient not taking: Reported on 07/28/2019), Disp: 60 tablet, Rfl: 0       Review of Systems  Constitutional: Negative for activity change, appetite change, chills, diaphoresis, fatigue, fever and unexpected weight change.  HENT: Negative for congestion, rhinorrhea, sinus pressure, sneezing, sore throat and trouble swallowing.   Eyes: Negative for photophobia and visual disturbance.  Respiratory: Negative for cough, chest tightness, shortness of breath, wheezing and stridor.   Cardiovascular: Negative for chest pain, palpitations and leg swelling.  Gastrointestinal: Negative for abdominal distention, anal bleeding, blood in stool, diarrhea, nausea and vomiting.  Genitourinary: Negative for difficulty urinating, dysuria, flank pain and hematuria.  Musculoskeletal: Negative for arthralgias, back pain, gait problem, joint swelling and myalgias.  Skin: Negative for color change, pallor, rash and wound.  Neurological: Negative for dizziness, tremors, syncope, weakness and light-headedness.  Hematological: Negative for adenopathy. Does not bruise/bleed easily.  Psychiatric/Behavioral: Negative for agitation, behavioral problems, confusion, decreased concentration, dysphoric mood and sleep disturbance.       Objective:   Physical Exam  Constitutional: [name] oriented to person, place, and time. She appears well-developed and well-nourished.  HENT:  Head: Normocephalic and atraumatic.  Eyes: Conjunctivae and EOM are normal.  Neck: Normal range of motion. Neck supple.  Cardiovascular: Normal rate and regular rhythm.  Pulmonary/Chest: Effort normal. No respiratory distress. She has no wheezes.  Abdominal: Soft. Bowel sounds are normal. She exhibits no distension. There is no abdominal tenderness. There is no rebound and no guarding.  Musculoskeletal: Normal range of motion.        General: No tenderness or edema.  Neurological: [name] alert and oriented to person, place, and time.  Skin: Skin is warm and dry. No rash noted. No erythema. No pallor.  Psychiatric: She  has a normal mood and affect. Her speech is normal and behavior is normal. Judgment and thought content normal. Her mood appears not anxious. [name] not hyperactive. Cognition and memory are normal. She does not exhibit a depressed mood.  Nursing note and vitals reviewed.         Assessment & Plan:   HIV: continue BIKTARVY and renew HMAP today. She ONLY HAS FAMILY PLANNING MEDICAID  Transgender: Continue aldactone and Cassie will investigate changing the formulation to an injectable form of estrogen.  We also check serum testosterone and estrogen levels today  STI exposure: Treat with ceftriaxone and azithromycin.  Check RPR, GC, chlamydia from urine , rectum and OP.   Let her take 2 doses of doxy regardless as PEP based on substudy of iPERGAY  Syphilis: sp mx treatments. . Check again today.

## 2019-07-29 LAB — T-HELPER CELL (CD4) - (RCID CLINIC ONLY)
CD4 % Helper T Cell: 39 % (ref 33–65)
CD4 T Cell Abs: 535 /uL (ref 400–1790)

## 2019-07-30 LAB — CYTOLOGY, (ORAL, ANAL, URETHRAL) ANCILLARY ONLY
Chlamydia: POSITIVE — AB
Chlamydia: POSITIVE — AB
Neisseria Gonorrhea: POSITIVE — AB
Neisseria Gonorrhea: POSITIVE — AB

## 2019-07-30 LAB — URINE CYTOLOGY ANCILLARY ONLY
Chlamydia: NEGATIVE
Neisseria Gonorrhea: NEGATIVE

## 2019-08-01 LAB — HIV RNA, RTPCR W/R GT (RTI, PI,INT)
HIV 1 RNA Quant: 30 copies/mL — ABNORMAL HIGH
HIV-1 RNA Quant, Log: 1.48 Log copies/mL — ABNORMAL HIGH

## 2019-08-01 LAB — COMPLETE METABOLIC PANEL WITH GFR
AG Ratio: 1.9 (calc) (ref 1.0–2.5)
ALT: 20 U/L (ref 9–46)
AST: 18 U/L (ref 10–40)
Albumin: 4.4 g/dL (ref 3.6–5.1)
Alkaline phosphatase (APISO): 47 U/L (ref 36–130)
BUN: 15 mg/dL (ref 7–25)
CO2: 28 mmol/L (ref 20–32)
Calcium: 9.4 mg/dL (ref 8.6–10.3)
Chloride: 104 mmol/L (ref 98–110)
Creat: 1.02 mg/dL (ref 0.60–1.35)
GFR, Est African American: 118 mL/min/{1.73_m2} (ref >=60)
GFR, Est Non African American: 102 mL/min/{1.73_m2} (ref >=60)
Globulin: 2.3 g/dL (calc) (ref 1.9–3.7)
Glucose, Bld: 81 mg/dL (ref 65–99)
Potassium: 4.5 mmol/L (ref 3.5–5.3)
Sodium: 139 mmol/L (ref 135–146)
Total Bilirubin: 0.3 mg/dL (ref 0.2–1.2)
Total Protein: 6.7 g/dL (ref 6.1–8.1)

## 2019-08-01 LAB — CBC WITH DIFFERENTIAL/PLATELET
Absolute Monocytes: 491 cells/uL (ref 200–950)
Basophils Absolute: 42 cells/uL (ref 0–200)
Basophils Relative: 1 %
Eosinophils Absolute: 71 cells/uL (ref 15–500)
Eosinophils Relative: 1.7 %
HCT: 40.9 % (ref 38.5–50.0)
Hemoglobin: 13.8 g/dL (ref 13.2–17.1)
Lymphs Abs: 1441 cells/uL (ref 850–3900)
MCH: 32.7 pg (ref 27.0–33.0)
MCHC: 33.7 g/dL (ref 32.0–36.0)
MCV: 96.9 fL (ref 80.0–100.0)
MPV: 9.3 fL (ref 7.5–12.5)
Monocytes Relative: 11.7 %
Neutro Abs: 2155 cells/uL (ref 1500–7800)
Neutrophils Relative %: 51.3 %
Platelets: 338 10*3/uL (ref 140–400)
RBC: 4.22 10*6/uL (ref 4.20–5.80)
RDW: 12.5 % (ref 11.0–15.0)
Total Lymphocyte: 34.3 %
WBC: 4.2 10*3/uL (ref 3.8–10.8)

## 2019-08-01 LAB — RPR: RPR Ser Ql: NONREACTIVE

## 2019-08-01 LAB — ESTROGENS, TOTAL: Estrogen: 170.4 pg/mL (ref 60–190)

## 2019-08-01 LAB — TESTOSTERONE: Testosterone: 771 ng/dL (ref 250–827)

## 2019-08-02 ENCOUNTER — Telehealth: Payer: Self-pay | Admitting: *Deleted

## 2019-08-02 DIAGNOSIS — A549 Gonococcal infection, unspecified: Secondary | ICD-10-CM

## 2019-08-02 MED ORDER — DOXYCYCLINE HYCLATE 100 MG PO TABS: 100.0000 mg | ORAL_TABLET | Freq: Two times a day (BID) | ORAL | 0 refills | Status: DC

## 2019-08-02 NOTE — Telephone Encounter (Signed)
Excellent

## 2019-08-02 NOTE — Telephone Encounter (Signed)
-----   Message from Truman Hayward, MD sent at 08/02/2019  9:14 AM EDT ----- I believe we gave Travis Palmer CTX and azithro in clinic already. I would also like her to take doxycycyline 100mg  bid x 7 days

## 2019-08-02 NOTE — Telephone Encounter (Signed)
Spoke with patient. Reset mychart password, relayed results and treatment for doxycyline per Dr Tommy Medal.  Patient has previous prescription of doxycycline (takes prn after sex for STI prevention), but will fill the treatment course for current infection.  RN updated her phone number as well. [name], RN

## 2019-09-29 ENCOUNTER — Ambulatory Visit (INDEPENDENT_AMBULATORY_CARE_PROVIDER_SITE_OTHER): Payer: Self-pay | Admitting: Infectious Disease

## 2019-09-29 ENCOUNTER — Other Ambulatory Visit: Payer: Self-pay

## 2019-09-29 ENCOUNTER — Encounter: Payer: Self-pay | Admitting: Infectious Disease

## 2019-09-29 ENCOUNTER — Ambulatory Visit (INDEPENDENT_AMBULATORY_CARE_PROVIDER_SITE_OTHER): Payer: Self-pay | Admitting: Pharmacist

## 2019-09-29 VITALS — Wt 175.2 lb

## 2019-09-29 DIAGNOSIS — Z789 Other specified health status: Secondary | ICD-10-CM

## 2019-09-29 DIAGNOSIS — F640 Transsexualism: Secondary | ICD-10-CM

## 2019-09-29 DIAGNOSIS — Z23 Encounter for immunization: Secondary | ICD-10-CM

## 2019-09-29 DIAGNOSIS — A539 Syphilis, unspecified: Secondary | ICD-10-CM

## 2019-09-29 DIAGNOSIS — B20 Human immunodeficiency virus [HIV] disease: Secondary | ICD-10-CM

## 2019-09-29 DIAGNOSIS — Z113 Encounter for screening for infections with a predominantly sexual mode of transmission: Secondary | ICD-10-CM

## 2019-09-29 MED ORDER — ESTRADIOL VALERATE 20 MG/ML IM OIL: 15.0000 mg | TOPICAL_OIL | INTRAMUSCULAR | 12 refills | Status: DC

## 2019-09-29 MED ORDER — SPIRONOLACTONE 100 MG PO TABS: 100.0000 mg | ORAL_TABLET | Freq: Every morning | ORAL | 11 refills | Status: DC

## 2019-09-29 MED ORDER — ESTRADIOL VALERATE 20 MG/ML IM OIL: 15.0000 mg | TOPICAL_OIL | INTRAMUSCULAR | 11 refills | Status: DC

## 2019-09-29 MED ORDER — SPIRONOLACTONE 50 MG PO TABS: 50.0000 mg | ORAL_TABLET | Freq: Every day | ORAL | 11 refills | Status: DC

## 2019-09-29 NOTE — Patient Instructions (Signed)
I will need for you to come back and get a basic metabolic panel in about 2 weeks after increase in your dose of aldactone

## 2019-09-29 NOTE — Progress Notes (Signed)
Subjective:   Chief complaint: She had some gastrointestinal that were worked up [name]  Patient ID: Travis Palmer, adult    DOB: [birth year], 23 y.o.   MRN: [id number]  HPI  Travis Palmer  returns for followup for HIV care. [name] on BIKTARVY and VL has been suppressed though not had any labs since December.  Since I last saw her she had been in [place] and while there had been hospitalized twice for it with abdominal pain she tells me that she was told that she had pancreatitis at one point in time and then later told that she did not have that.  She denies drinking large amounts of alcohol and does not seem to have much other risk factors for pancreatitis but having lacked the records it is hard for me to comment on this.  [name] being adherent to her [place] Odor and was suppressed when we checked labs in August.  She was as mentioned interested in changing over to estradiol injections from her pills.  We also could increase her testosterone blockade by increasing her Aldactone long as he can monitor potassium carefully.  I also very much wanted to give her a flu shot as well as pneumonia shot today and after quite a bit of discussion eventually patient consented to getting vaccinated after long discussion with our infectious disease pharmacist Cassie      Past Medical History:  Diagnosis Date   Epigastric pain 10/01/2017   History of bipolar disorder    HIV infection ([place])    Housing problems 08/26/2016   Routine screening for STI (sexually transmitted infection) 01/26/2018   Syphilis 08/02/2015    Past Surgical History:  Procedure Laterality Date   ENDOSCOPIC RETROGRADE CHOLANGIOPANCREATOGRAPHY (ERCP) WITH PROPOFOL N/A 02/12/2018   Procedure: ENDOSCOPIC RETROGRADE CHOLANGIOPANCREATOGRAPHY (ERCP) WITH PROPOFOL;  Surgeon: Ladene Artist, MD;  Location: [facility];  Service: Endoscopy;  Laterality: N/A;   WISDOM TOOTH EXTRACTION  2014    Family History  Problem Relation Age  of Onset   Lactose intolerance Mother    Cancer Maternal Aunt    Hypertension Maternal Grandmother       Social History   Socioeconomic History   Marital status: Single    Spouse name: Not on file   Number of children: Not on file   Years of education: Not on file   Highest education level: Not on file  Occupational History   Not on file  Social Needs   Financial resource strain: Not on file   Food insecurity    Worry: Not on file    Inability: Not on file   Transportation needs    Medical: Not on file    Non-medical: Not on file  Tobacco Use   Smoking status: Former Smoker    Packs/day: 0.00    Types: Cigarettes   Smokeless tobacco: Never Used  Substance and Sexual Activity   Alcohol use: Yes    Alcohol/week: 0.0 standard drinks    Comment: weekend drinker   Drug use: Not Currently    Frequency: 7.0 times per week    Types: Marijuana    Comment: every other day   Sexual activity: Not Currently    Comment: given condoms  Lifestyle   Physical activity    Days per week: Not on file    Minutes per session: Not on file   Stress: Not on file  Relationships   Social connections    Talks on phone: Not on file  Gets together: Not on file    Attends religious service: Not on file    Active member of club or organization: Not on file    Attends meetings of clubs or organizations: Not on file    Relationship status: Not on file  Other Topics Concern   Not on file  Social History Narrative   Not on file    Allergies  Allergen Reactions   Apple Itching, Swelling and Other (See Comments)    Mouth swells   Depakote [Divalproex Sodium] Other (See Comments)    Hospitalized for 3 days for extreme GI upset because of this     Current Outpatient Medications:    bictegravir-emtricitabine-tenofovir AF (BIKTARVY) 50-200-25 MG TABS tablet, Take 1 tablet by mouth daily., Disp: 30 tablet, Rfl: 11   dicyclomine (BENTYL) 20 MG tablet, TAKE 1 TABLET  BY MOUTH THREE TIMES A DAY AS NEEDED (ABDOMINAL PAIN) FOR UP TO 7 DAYS, Disp: , Rfl:    doxycycline (VIBRA-TABS) 100 MG tablet, Take two tablets within 24-73 hours of sex to prevent STI, Disp: 60 tablet, Rfl: 0   doxycycline (VIBRA-TABS) 100 MG tablet, Take 1 tablet (100 mg total) by mouth 2 (two) times daily., Disp: 14 tablet, Rfl: 0   estradiol (ESTRACE) 2 MG tablet, Take 2 tablets in the morning and 1 at night, Disp: 90 tablet, Rfl: 11   spironolactone (ALDACTONE) 100 MG tablet, Take 1 tablet (100 mg total) by mouth daily., Disp: 30 tablet, Rfl: 11      Review of Systems  Constitutional: Negative for activity change, appetite change, chills, diaphoresis, fatigue, fever and unexpected weight change.  HENT: Negative for congestion, rhinorrhea, sinus pressure, sneezing, sore throat and trouble swallowing.   Eyes: Negative for photophobia and visual disturbance.  Respiratory: Negative for cough, chest tightness, shortness of breath, wheezing and stridor.   Cardiovascular: Negative for chest pain, palpitations and leg swelling.  Gastrointestinal: Positive for abdominal pain. Negative for abdominal distention, anal bleeding, blood in stool, diarrhea, nausea and vomiting.  Genitourinary: Negative for difficulty urinating, dysuria, flank pain and hematuria.  Musculoskeletal: Negative for arthralgias, back pain, gait problem, joint swelling and myalgias.  Skin: Negative for color change, pallor, rash and wound.  Neurological: Negative for dizziness, tremors, syncope, weakness and light-headedness.  Hematological: Negative for adenopathy. Does not bruise/bleed easily.  Psychiatric/Behavioral: Negative for agitation, behavioral problems, confusion, decreased concentration, dysphoric mood and sleep disturbance.       Objective:   Physical Exam  Constitutional: [name] oriented to person, place, and time. She appears well-developed and well-nourished.  HENT:  Head: Normocephalic and  atraumatic.  Eyes: Conjunctivae and EOM are normal.  Neck: Normal range of motion. Neck supple.  Cardiovascular: Normal rate and regular rhythm. Exam reveals no gallop and no friction rub.  No murmur heard. Pulmonary/Chest: Effort normal. No respiratory distress. She has no wheezes.  Abdominal: Soft. Bowel sounds are normal. She exhibits no distension. There is no abdominal tenderness. There is no rebound and no guarding.  Musculoskeletal: Normal range of motion.        General: No tenderness or edema.  Neurological: [name] alert and oriented to person, place, and time.  Skin: Skin is warm and dry. No rash noted. No erythema. No pallor.  Psychiatric: She has a normal mood and affect. Her speech is normal. Judgment and thought content normal. Her mood appears not anxious. [name] agitated. [name] not hyperactive. Cognition and memory are normal. She does not exhibit a depressed mood.  Nursing note and vitals reviewed.         Assessment & Plan:   HIV: continue BIKTARVY   We will try to get her access to an ACA plan given  the new decisions by the HIV medication assistance program in [place]  We will bring her back in January for renewal of that program.  He consented to flu vaccination and pneumonia vaccination but she does need meningococcal and HPV vaccination still.  Transgender:  Her over to injectable estradiol and increase her spironolactone dose.  We will have her come back for metabolic panel 1 week after increase in the Aldactone   Syphilis: sp mx treatments:  I spent greater than 25 minutes with the patient including greater than 50% of time in face to face counsel of the patient regarding risks and benefits of vaccines, regarding how we would optimize her transgender health and also need to adhere to her antiretroviral regimen and in coordination of care with infectious disease pharmacy

## 2019-09-29 NOTE — Progress Notes (Signed)
HPI: Travis Palmer is a 23 y.o. adult who presents to the [facility] for HIV follow-up.  Patient Active Problem List   Diagnosis Date Noted  . Calculus of bile duct without cholangitis with obstruction   . Elevated LFTs   . Common bile duct dilation 02/10/2018  . Routine screening for STI (sexually transmitted infection) 01/26/2018  . Abdominal pain, epigastric 10/01/2017  . 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 Chlamydia 05/07/2017  . Housing problems 08/26/2016  . Syphilis 08/02/2015  . HIV disease ([place]) 01/09/2015  . 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 Cigarette smoker 01/09/2015    Patient's Medications  New Prescriptions   ESTRADIOL VALERATE (DELESTROGEN) 20 MG/ML INJECTION    Inject 0.75 mLs (15 mg total) into the muscle once a week.   SPIRONOLACTONE (ALDACTONE) 50 MG TABLET    Take 1 tablet (50 mg total) by mouth at bedtime.  Previous Medications   BICTEGRAVIR-EMTRICITABINE-TENOFOVIR AF (BIKTARVY) 50-200-25 MG TABS TABLET    Take 1 tablet by mouth daily.   DICYCLOMINE (BENTYL) 20 MG TABLET    TAKE 1 TABLET BY MOUTH THREE TIMES A DAY AS NEEDED (ABDOMINAL PAIN) FOR UP TO 7 DAYS   DOXYCYCLINE (VIBRA-TABS) 100 MG TABLET    Take two tablets within 24-73 hours of sex to prevent STI   DOXYCYCLINE (VIBRA-TABS) 100 MG TABLET    Take 1 tablet (100 mg total) by mouth 2 (two) times daily.   ESTRADIOL (ESTRACE) 2 MG TABLET    Take 2 tablets in the morning and 1 at night  Modified Medications   Modified Medication Previous Medication   SPIRONOLACTONE (ALDACTONE) 100 MG TABLET spironolactone (ALDACTONE) 100 MG tablet      Take 1 tablet (100 mg total) by mouth every morning.    Take 1 tablet (100 mg total) by mouth daily.  Discontinued Medications   No medications on file    Allergies: Allergies  Allergen Reactions  . Apple Itching, Swelling and Other (See Comments)    Mouth swells  . Depakote [Divalproex Sodium] Other (See Comments)    Hospitalized for 3 days for extreme GI  upset because of this    Past Medical History: Past Medical History:  Diagnosis Date  . Epigastric pain 10/01/2017  . History of bipolar disorder   . HIV infection ([place])   . Housing problems 08/26/2016  . Routine screening for STI (sexually transmitted infection) 01/26/2018  . Syphilis 08/02/2015    Social History: Social History   Socioeconomic History  . Marital status: Single    Spouse name: Not on file  . Number of children: Not on file  . Years of education: Not on file  . Highest education level: Not on file  Occupational History  . Not on file  Social Needs  . Financial resource strain: Not on file  . Food insecurity    Worry: Not on file    Inability: Not on file  . Transportation needs    Medical: Not on file    Non-medical: Not on file  Tobacco Use  . Smoking status: Former Smoker    Packs/day: 0.00    Types: Cigarettes  . Smokeless tobacco: Never Used  Substance and Sexual Activity  . Alcohol use: Yes    Alcohol/week: 0.0 standard drinks    Comment: weekend drinker  . Drug use: Not Currently    Frequency: 7.0 times per week    Types: Marijuana    Comment: every other day  . Sexual activity: Not Currently  Comment: given condoms  Lifestyle  . Physical activity    Days per week: Not on file    Minutes per session: Not on file  . Stress: Not on file  Relationships  . Social Herbalist on phone: Not on file    Gets together: Not on file    Attends religious service: Not on file    Active member of club or organization: Not on file    Attends meetings of clubs or organizations: Not on file    Relationship status: Not on file  Other Topics Concern  . Not on file  Social History Narrative  . Not on file    Labs: Lab Results  Component Value Date   HIV1RNAQUANT 30 (H) 07/28/2019   HIV1RNAQUANT 127 (H) 11/23/2018   HIV1RNAQUANT <20 DETECTED (A) 01/26/2018   HIV1RNAVL 50 11/06/2015   HIV1RNAVL 175 09/26/2015   HIV1RNAVL <40  07/19/2015   CD4TABS 535 07/28/2019   CD4TABS 640 11/23/2018   CD4TABS 520 01/26/2018    RPR and STI Lab Results  Component Value Date   LABRPR NON-REACTIVE 07/28/2019   LABRPR NON-REACTIVE 11/23/2018   LABRPR NON-REACTIVE 01/26/2018   LABRPR NON-REACTIVE 08/15/2017   LABRPR NON REAC 04/29/2017   RPRTITER 1:128 (A) 05/24/2015    STI Results GC GC CT CT  Latest Ref Rng & Units - NEGATIVE - NEGATIVE  07/28/2019 Negative - Negative -  07/28/2019 **POSITIVE**(A) - **POSITIVE**(A) -  07/28/2019 **POSITIVE**(A) - **POSITIVE**(A) -  01/26/2018 **POSITIVE**(A) - Negative -  01/26/2018 Negative - Negative -  01/26/2018 Negative - Negative -  08/15/2017 Negative - Negative -  08/15/2017 Negative - Negative -  08/15/2017 Negative - Negative -  04/29/2017 Negative - **POSITIVE**(A) -  04/29/2017 **POSITIVE**(A) - Negative -  12/07/2014 NG: Negative - CT: Negative -  12/20/2013 - NEGATIVE - NEGATIVE    Hepatitis B Lab Results  Component Value Date   HEPBSAB NEG 12/07/2014   HEPBSAG Negative 02/10/2018   HEPBCAB NON REACTIVE 12/07/2014   Hepatitis C No results found for: HEPCAB, HCVRNAPCRQN Hepatitis A Lab Results  Component Value Date   HAV REACTIVE (A) 12/07/2014   Lipids: Lab Results  Component Value Date   CHOL 131 01/26/2018   TRIG 61 01/26/2018   HDL 56 01/26/2018   CHOLHDL 2.3 01/26/2018   VLDL 18 01/31/2015   LDLCALC 61 01/26/2018    Current HIV Regimen: Biktarvy  Assessment: Travis Palmer is here today for a hormone therapy change. She has been taking estradiol tablets 6 mg/day and was interested in lowering her pill burden. Because of this, she was switched to estradiol valerate intramuscular injection 15 mg weekly. Her testosterone was not at goal, and we increased her spironolactone from 100 mg daily to 100 mg in the morning and 50 mg at bedtime.   We counseled her on the appropriate technique to administer her new injectable medication and asked her to contact us if she has  any concerns or questions. She also saw Dr. Tommy Medal today for her HIV follow-up appointment. [name] taking Biktarvy as directed with no adherence issues. She received the influenza vaccine today in addition to the Prevnar 13 vaccine.   Plan: - Start estradiol valerate intramuscular injection  - Increased spironolactone dose to 100 mg in the morning and 50 mg at bedtime - Will follow up with Dr. Tommy Medal on 01/03/2020  Agnes Lawrence, PharmD PGY1 Pharmacy Resident [place] for Infectious Disease 09/29/2019, 10:33 AM

## 2019-10-24 ENCOUNTER — Other Ambulatory Visit: Payer: Self-pay | Admitting: Infectious Disease

## 2019-10-24 DIAGNOSIS — B20 Human immunodeficiency virus [HIV] disease: Secondary | ICD-10-CM

## 2019-10-30 ENCOUNTER — Other Ambulatory Visit: Payer: Self-pay | Admitting: Infectious Disease

## 2019-10-30 DIAGNOSIS — B20 Human immunodeficiency virus [HIV] disease: Secondary | ICD-10-CM

## 2019-11-01 ENCOUNTER — Telehealth: Payer: Self-pay | Admitting: *Deleted

## 2019-11-01 NOTE — Telephone Encounter (Signed)
Patient has tried to refill doxy 100 mg (take 2 tablets within 24-73 hours of sex to prevent STI), but it was denied stating that patient needed to contact her physician's office. Patient did not contact [facility], asked [facility] to do so instead.   Original doxycycline prescription written 07/28/2019 without refill by Dr Tommy Medal.   Will need MD approval for this refill, but patient still needs to come in for lab work.  Patient was supposed to come in mid November for blood work following her increase in spironolactone dose, but has not.  RN attempted to reach patient, but left message asking her to call her doctor's office. [name], RN

## 2019-11-01 NOTE — Telephone Encounter (Signed)
Sharyn Lull thanks yes she should come in for appt

## 2019-11-02 ENCOUNTER — Other Ambulatory Visit: Payer: Self-pay | Admitting: Infectious Disease

## 2019-11-02 DIAGNOSIS — B20 Human immunodeficiency virus [HIV] disease: Secondary | ICD-10-CM

## 2019-11-03 ENCOUNTER — Telehealth: Payer: Self-pay | Admitting: *Deleted

## 2019-11-03 DIAGNOSIS — Z789 Other specified health status: Secondary | ICD-10-CM

## 2019-11-03 DIAGNOSIS — F640 Transsexualism: Secondary | ICD-10-CM

## 2019-11-03 MED ORDER — "SYRINGE 21G X 1-1/2"" 3 ML MISC": 11 refills | Status: DC

## 2019-11-03 NOTE — Telephone Encounter (Signed)
Pharmacy requesting prescription for needles and syringe to inject her delestrogen.  RN spoke with pharmacy, sent prescription for 21g 1.5" needle with 35ml syringe per their request, cosigned by Terri Piedra, NP. [name], RN

## 2019-11-08 ENCOUNTER — Other Ambulatory Visit: Payer: Self-pay | Admitting: Family

## 2019-11-08 DIAGNOSIS — Z789 Other specified health status: Secondary | ICD-10-CM

## 2019-11-08 DIAGNOSIS — IMO0001 Reserved for inherently not codable concepts without codable children: Secondary | ICD-10-CM

## 2019-11-08 DIAGNOSIS — F640 Transsexualism: Secondary | ICD-10-CM

## 2019-11-08 MED ORDER — "SYRINGE 21G X 1-1/2"" 3 ML MISC": 5 refills | Status: DC

## 2019-11-09 ENCOUNTER — Ambulatory Visit (INDEPENDENT_AMBULATORY_CARE_PROVIDER_SITE_OTHER): Payer: Self-pay | Admitting: Infectious Disease

## 2019-11-09 ENCOUNTER — Other Ambulatory Visit: Payer: Self-pay

## 2019-11-09 DIAGNOSIS — A539 Syphilis, unspecified: Secondary | ICD-10-CM

## 2019-11-09 DIAGNOSIS — Z599 Problem related to housing and economic circumstances, unspecified: Secondary | ICD-10-CM

## 2019-11-09 DIAGNOSIS — F640 Transsexualism: Secondary | ICD-10-CM

## 2019-11-09 DIAGNOSIS — A749 Chlamydial infection, unspecified: Secondary | ICD-10-CM

## 2019-11-09 DIAGNOSIS — Z789 Other specified health status: Secondary | ICD-10-CM

## 2019-11-09 DIAGNOSIS — A549 Gonococcal infection, unspecified: Secondary | ICD-10-CM

## 2019-11-09 DIAGNOSIS — B20 Human immunodeficiency virus [HIV] disease: Secondary | ICD-10-CM

## 2019-11-09 MED ORDER — DOXYCYCLINE HYCLATE 100 MG PO TABS: ORAL_TABLET | ORAL | 5 refills | Status: DC

## 2019-11-09 NOTE — Progress Notes (Addendum)
Virtual Visit via VIDEO Note  I connected with Travis Palmer on 11/09/19 at 11:00 AM EST by VIDEO and verified that I am speaking with the correct person using two identifiers.  Location: Patient: Someone elses home (patient is homeless) Provider: my home.   I discussed the limitations, risks, security and privacy concerns of performing an evaluation and management service by VIDEO and the availability of in person appointments.. The patient expressed understanding and agreed to proceed.   History of Present Illness:  Travis Palmer  returns for followup for HIV care via EVISIT>   [name] on BIKTARVY and VL has been suppressed.  She received her flu  Shot but needs a 2nd meningitis vaccine.  She had been taking doxycyline daily but also including the doxy I had rx for PEP.  [name] on estradiol and aldactone to help with transngender male to male.  ROS as in HPI and otherwise negative on 12 point review  Past Medical History:  Diagnosis Date  . Epigastric pain 10/01/2017  . History of bipolar disorder   . HIV infection ([place])   . Housing problems 08/26/2016  . Routine screening for STI (sexually transmitted infection) 01/26/2018  . Syphilis 08/02/2015    Past Surgical History:  Procedure Laterality Date  . ENDOSCOPIC RETROGRADE CHOLANGIOPANCREATOGRAPHY (ERCP) WITH PROPOFOL N/A 02/12/2018   Procedure: ENDOSCOPIC RETROGRADE CHOLANGIOPANCREATOGRAPHY (ERCP) WITH PROPOFOL;  Surgeon: Ladene Artist, MD;  Location: [facility];  Service: Endoscopy;  Laterality: N/A;  . WISDOM TOOTH EXTRACTION  2014    Family History  Problem Relation Age of Onset  . Lactose intolerance Mother   . Cancer Maternal Aunt   . Hypertension Maternal Grandmother       Social History   Socioeconomic History  . Marital status: Single    Spouse name: Not on file  . Number of children: Not on file  . Years of education: Not on file  . Highest education level: Not on file  Occupational History  .  Not on file  Social Needs  . Financial resource strain: Not on file  . Food insecurity    Worry: Not on file    Inability: Not on file  . Transportation needs    Medical: Not on file    Non-medical: Not on file  Tobacco Use  . Smoking status: Former Smoker    Packs/day: 0.00    Types: Cigarettes  . Smokeless tobacco: Never Used  Substance and Sexual Activity  . Alcohol use: Yes    Alcohol/week: 0.0 standard drinks    Comment: weekend drinker  . Drug use: Not Currently    Frequency: 7.0 times per week    Types: Marijuana    Comment: every other day  . Sexual activity: Not Currently    Comment: given condoms  Lifestyle  . Physical activity    Days per week: Not on file    Minutes per session: Not on file  . Stress: Not on file  Relationships  . Social Herbalist on phone: Not on file    Gets together: Not on file    Attends religious service: Not on file    Active member of club or organization: Not on file    Attends meetings of clubs or organizations: Not on file    Relationship status: Not on file  Other Topics Concern  . Not on file  Social History Narrative  . Not on file    Allergies  Allergen Reactions  .  Apple Itching, Swelling and Other (See Comments)    Mouth swells  . Depakote [Divalproex Sodium] Other (See Comments)    Hospitalized for 3 days for extreme GI upset because of this     Current Outpatient Medications:  .  bictegravir-emtricitabine-tenofovir AF (BIKTARVY) 50-200-25 MG TABS tablet, Take 1 tablet by mouth daily., Disp: 30 tablet, Rfl: 11 .  dicyclomine (BENTYL) 20 MG tablet, TAKE 1 TABLET BY MOUTH THREE TIMES A DAY AS NEEDED (ABDOMINAL PAIN) FOR UP TO 7 DAYS, Disp: , Rfl:  .  doxycycline (VIBRA-TABS) 100 MG tablet, Take two tablets within 24-73 hours of sex to prevent STI, Disp: 30 tablet, Rfl: 5 .  estradiol valerate (DELESTROGEN) 20 MG/ML injection, Inject 0.75 mLs (15 mg total) into the muscle once a week., Disp: 5 mL, Rfl: 11  .  spironolactone (ALDACTONE) 100 MG tablet, Take 1 tablet (100 mg total) by mouth every morning., Disp: 30 tablet, Rfl: 11 .  spironolactone (ALDACTONE) 50 MG tablet, Take 1 tablet (50 mg total) by mouth at bedtime., Disp: 30 tablet, Rfl: 11 .  Syringe/Needle, Disp, (SYRINGE 3CC/21GX1-1/2") 21G X 1-1/2" 3 ML MISC, Use as directed with delestrogen, Disp: 4 each, Rfl: 5      Observations/Objective:  She was alert and oriented and in NAD.   She seems likely to be adherent to ARV and medicaitons. 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  Hx of STI including syphilis, GC and chlamydia:  We will continue 200mg  of doxycyline post sex as per the iPERGAY substudy to try to prevent Chlamydia and syphilis. Ensure checking extragenital and genital sites for GC, chlamydia.  Homeless: will see what has been done and check in with Scarlette Calico  Follow Up Instructions:    I discussed the assessment and treatment plan with the patient. The patient was provided an opportunity to ask questions and all were answered. The patient agreed with the plan and demonstrated an understanding of the instructions.   The patient was advised to call back or seek an in-person evaluation if the symptoms worsen or if the condition fails to improve as anticipated.    Alcide Evener, MD

## 2020-01-03 ENCOUNTER — Telehealth: Payer: Self-pay | Admitting: Pharmacist

## 2020-01-03 ENCOUNTER — Ambulatory Visit: Payer: Self-pay

## 2020-01-03 ENCOUNTER — Ambulatory Visit: Payer: Self-pay | Admitting: Infectious Disease

## 2020-01-03 DIAGNOSIS — IMO0001 Reserved for inherently not codable concepts without codable children: Secondary | ICD-10-CM

## 2020-01-03 DIAGNOSIS — F640 Transsexualism: Secondary | ICD-10-CM

## 2020-01-03 MED ORDER — "SYRINGE/NEEDLE (DISP) 25G X 1-1/2"" 3 ML MISC": 11 refills | Status: DC

## 2020-01-03 MED ORDER — "SYRINGE 21G X 1-1/2"" 3 ML MISC": 5 refills | Status: DC

## 2020-01-03 NOTE — Telephone Encounter (Signed)
Patient asked that a new prescription be sent in for needles for her Delestrogen injections. Adalberto Cole student, called [facility] and will send in 25G.

## 2020-01-05 ENCOUNTER — Encounter: Payer: Self-pay | Admitting: Infectious Disease

## 2020-02-04 ENCOUNTER — Telehealth: Payer: Self-pay

## 2020-02-04 NOTE — Telephone Encounter (Signed)
COVID-19 Pre-Screening Questions:02/04/20   Do you currently have a fever (>100 F), chills or unexplained body aches? NO  Are you currently experiencing new cough, shortness of breath, sore throat, runny nose? NO  Have you recently travelled outside the state of [place] in the last 14 days?NO .  Have you been in contact with someone that is currently pending confirmation of Covid19 testing or has been confirmed to have the [place] virus? NO  **If the patient answers NO to ALL questions -  advise the patient to please call the clinic before coming to the office should any symptoms develop.

## 2020-02-09 ENCOUNTER — Other Ambulatory Visit: Payer: Self-pay

## 2020-02-09 ENCOUNTER — Ambulatory Visit (INDEPENDENT_AMBULATORY_CARE_PROVIDER_SITE_OTHER): Payer: Self-pay | Admitting: Infectious Disease

## 2020-02-09 VITALS — BP 129/77 | HR 85 | Temp 98.1°F | Wt 169.4 lb

## 2020-02-09 DIAGNOSIS — A549 Gonococcal infection, unspecified: Secondary | ICD-10-CM

## 2020-02-09 DIAGNOSIS — F17210 Nicotine dependence, cigarettes, uncomplicated: Secondary | ICD-10-CM

## 2020-02-09 DIAGNOSIS — Z789 Other specified health status: Secondary | ICD-10-CM

## 2020-02-09 DIAGNOSIS — B20 Human immunodeficiency virus [HIV] disease: Secondary | ICD-10-CM

## 2020-02-09 DIAGNOSIS — A539 Syphilis, unspecified: Secondary | ICD-10-CM

## 2020-02-09 DIAGNOSIS — A749 Chlamydial infection, unspecified: Secondary | ICD-10-CM

## 2020-02-09 DIAGNOSIS — F640 Transsexualism: Secondary | ICD-10-CM

## 2020-02-09 MED ORDER — CEFTRIAXONE SODIUM 250 MG IJ SOLR
500.0000 mg | Freq: Once | INTRAMUSCULAR | Status: AC
  Administered 2020-02-09: 15:00:00 500 mg via INTRAMUSCULAR

## 2020-02-09 MED ORDER — DOXYCYCLINE HYCLATE 100 MG PO TABS: ORAL_TABLET | ORAL | 8 refills | Status: DC

## 2020-02-09 MED ORDER — BIKTARVY 50-200-25 MG PO TABS: 1.0000 | ORAL_TABLET | Freq: Every day | ORAL | 11 refills | Status: DC

## 2020-02-09 MED ORDER — DOXYCYCLINE HYCLATE 100 MG PO TABS: ORAL_TABLET | ORAL | 11 refills | Status: DC

## 2020-02-09 NOTE — Progress Notes (Signed)
Chief complaint is constipation and pain with receptive anal intercourse Subjective:    Patient ID: Travis Palmer, adult    DOB: 04/03/1996, 24 y.o.   MRN: [id number]  HPI Travis Palmer is a 24 year old African-American transgender woman who has HIV that is been relatively well controlled.  She has a history of recurrent STIs and again has sensation of rectal pain that she is concerned is consistent with a not sexually transmitted infection.  I had her taking doxycycline 200 mg post sex to try to prevent chlamydia and syphilis.  Unfortunately she is running out of doxycycline.  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    Past Medical History:  Diagnosis Date  . Epigastric pain 10/01/2017  . History of bipolar disorder   . HIV infection ([place])   . Housing problems 08/26/2016  . Routine screening for STI (sexually transmitted infection) 01/26/2018  . Syphilis 08/02/2015    Past Surgical History:  Procedure Laterality Date  . ENDOSCOPIC RETROGRADE CHOLANGIOPANCREATOGRAPHY (ERCP) WITH PROPOFOL N/A 02/12/2018   Procedure: ENDOSCOPIC RETROGRADE CHOLANGIOPANCREATOGRAPHY (ERCP) WITH PROPOFOL;  Surgeon: Ladene Artist, MD;  Location: [facility];  Service: Endoscopy;  Laterality: N/A;  . WISDOM TOOTH EXTRACTION  2014    Family History  Problem Relation Age of Onset  . Lactose intolerance Mother   . Cancer Maternal Aunt   . Hypertension Maternal Grandmother       Social History   Socioeconomic History  . Marital status: Single    Spouse name: Not on file  . Number of children: Not on file  . Years of education: Not on file  . Highest education level: Not on file  Occupational History  . Not on file  Tobacco Use  . Smoking status: Former Smoker    Packs/day: 0.00    Types: Cigarettes  . Smokeless tobacco: Never Used  Substance and Sexual Activity  . Alcohol use: Yes    Alcohol/week:  0.0 standard drinks    Comment: weekend drinker  . Drug use: Not Currently    Frequency: 7.0 times per week    Types: Marijuana    Comment: every other day  . Sexual activity: Not Currently    Comment: given condoms  Other Topics Concern  . Not on file  Social History Narrative  . Not on file   Social Determinants of Health   Financial Resource Strain:   . Difficulty of Paying Living Expenses: Not on file  Food Insecurity:   . Worried About Charity fundraiser in the Last Year: Not on file  . Ran Out of Food in the Last Year: Not on file  Transportation Needs:   . Lack of Transportation (Medical): Not on file  . Lack of Transportation (Non-Medical): Not on file  Physical Activity:   . Days of Exercise per Week: Not on file  . Minutes of Exercise per Session: Not on file  Stress:   . Feeling of Stress : Not on file  Social Connections:   . Frequency of Communication with Friends and Family: Not on file  . Frequency of Social Gatherings with Friends and Family: Not on file  . Attends Religious Services: Not on file  . Active Member of Clubs or Organizations: Not on file  . Attends Archivist Meetings: Not on file  . Marital Status: Not on file    Allergies  Allergen Reactions  .  Apple Itching, Swelling and Other (See Comments)    Mouth swells  . Depakote [Divalproex Sodium] Other (See Comments)    Hospitalized for 3 days for extreme GI upset because of this     Current Outpatient Medications:  .  bictegravir-emtricitabine-tenofovir AF (BIKTARVY) 50-200-25 MG TABS tablet, Take 1 tablet by mouth daily., Disp: 30 tablet, Rfl: 11 .  dicyclomine (BENTYL) 20 MG tablet, TAKE 1 TABLET BY MOUTH THREE TIMES A DAY AS NEEDED (ABDOMINAL PAIN) FOR UP TO 7 DAYS, Disp: , Rfl:  .  estradiol valerate (DELESTROGEN) 20 MG/ML injection, Inject 0.75 mLs (15 mg total) into the muscle once a week., Disp: 5 mL, Rfl: 11 .  spironolactone (ALDACTONE) 100 MG tablet, Take 1 tablet (100  mg total) by mouth every morning., Disp: 30 tablet, Rfl: 11 .  spironolactone (ALDACTONE) 50 MG tablet, Take 1 tablet (50 mg total) by mouth at bedtime., Disp: 30 tablet, Rfl: 11 .  SYRINGE-NEEDLE, DISP, 3 ML 25G X 1-1/2" 3 ML MISC, Use as directed, Disp: 4 each, Rfl: 11 .  Syringe/Needle, Disp, (SYRINGE 3CC/21GX1-1/2") 21G X 1-1/2" 3 ML MISC, Use as directed with delestrogen, Disp: 4 each, Rfl: 5 .  doxycycline (VIBRA-TABS) 100 MG tablet, Take two tablets within 24-73 hours of sex to prevent STI (Patient not taking: Reported on 02/09/2020), Disp: 30 tablet, Rfl: 5  Review of Systems  Constitutional: Negative for chills and fever.  HENT: Negative for congestion and sore throat.   Eyes: Negative for photophobia.  Respiratory: Negative for cough, shortness of breath and wheezing.   Cardiovascular: Negative for chest pain, palpitations and leg swelling.  Gastrointestinal: Positive for rectal pain. Negative for abdominal pain, blood in stool, constipation, diarrhea, nausea and vomiting.  Genitourinary: Negative for dysuria, flank pain and hematuria.  Musculoskeletal: Negative for back pain and myalgias.  Skin: Negative for rash.  Neurological: Negative for dizziness, weakness and headaches.  Hematological: Does not bruise/bleed easily.  Psychiatric/Behavioral: Negative for agitation, confusion, dysphoric mood, self-injury and suicidal ideas.       Objective:   Physical Exam Constitutional:      General: [name] not in acute distress.    Appearance: Normal appearance. [name] well-developed. [name] not ill-appearing or diaphoretic.  HENT:     Head: Normocephalic and atraumatic.     Right Ear: Hearing and external ear normal.     Left Ear: Hearing and external ear normal.     Nose: No nasal deformity or rhinorrhea.  Eyes:     General: No scleral icterus.    Extraocular Movements: Extraocular movements intact.     Conjunctiva/sclera: Conjunctivae normal.     Right eye: Right conjunctiva is  not injected.     Left eye: Left conjunctiva is not injected.  Neck:     Vascular: No JVD.  Cardiovascular:     Rate and Rhythm: Normal rate and regular rhythm.     Heart sounds: S1 normal and S2 normal.  Abdominal:     General: There is no distension.     Palpations: Abdomen is soft.  Musculoskeletal:        General: Normal range of motion.     Right shoulder: Normal.     Left shoulder: Normal.     Cervical back: Normal range of motion and neck supple.     Right hip: Normal.     Left hip: Normal.     Right knee: Normal.     Left knee: Normal.  Lymphadenopathy:  Head:     Right side of head: No submandibular, preauricular or posterior auricular adenopathy.     Left side of head: No submandibular, preauricular or posterior auricular adenopathy.     Cervical: No cervical adenopathy.     Right cervical: No superficial or deep cervical adenopathy.    Left cervical: No superficial or deep cervical adenopathy.  Skin:    General: Skin is warm and dry.     Coloration: Skin is not pale.     Findings: No abrasion, bruising, ecchymosis, erythema, lesion or rash.     Nails: There is no clubbing.  Neurological:     General: No focal deficit present.     Mental Status: [name] alert and oriented to person, place, and time.     Sensory: No sensory deficit.     Coordination: Coordination normal.     Gait: Gait normal.  Psychiatric:        Attention and Perception: [name] attentive.        Mood and Affect: Mood normal.        Speech: Speech normal.        Behavior: Behavior normal. Behavior is cooperative.        Thought Content: Thought content normal.        Judgment: Judgment normal.           Assessment & Plan:  Rectal pain: Likely STI: She wants to be preemptively treated for STIs and will give her ceftriaxone 500 mg to cover gonorrhea.  I will give her doxycycline 100 mg twice daily for a week which she can extend if she is not having improvement in her symptoms and could  even extend to a month if she wants to treat syphilis if she has that.  To prevent other STIs I have given her doxycycline 60 tablets so that she can potentially take 200 mg tablets on a daily basis to try to prevent chlamydia and syphilis.  This strategy has not been effective for gonorrhea unfortunately.  HIV disease continue Biktarvy and checking labs today set up my chart visit to review labs in few weeks.  Transgender health continue current therapies.

## 2020-02-10 LAB — CYTOLOGY, (ORAL, ANAL, URETHRAL) ANCILLARY ONLY
Chlamydia: NEGATIVE
Chlamydia: NEGATIVE
Comment: NEGATIVE
Comment: NEGATIVE
Comment: NORMAL
Comment: NORMAL
Neisseria Gonorrhea: NEGATIVE
Neisseria Gonorrhea: POSITIVE — AB

## 2020-02-10 LAB — URINE CYTOLOGY ANCILLARY ONLY
Chlamydia: NEGATIVE
Comment: NEGATIVE
Comment: NORMAL
Neisseria Gonorrhea: NEGATIVE

## 2020-02-10 LAB — T-HELPER CELL (CD4) - (RCID CLINIC ONLY)
CD4 % Helper T Cell: 41 % (ref 33–65)
CD4 T Cell Abs: 837 /uL (ref 400–1790)

## 2020-02-11 ENCOUNTER — Telehealth: Payer: Self-pay

## 2020-02-11 LAB — CBC WITH DIFFERENTIAL/PLATELET
Absolute Monocytes: 578 cells/uL (ref 200–950)
Basophils Absolute: 59 cells/uL (ref 0–200)
Basophils Relative: 1 %
Eosinophils Absolute: 77 cells/uL (ref 15–500)
Eosinophils Relative: 1.3 %
HCT: 36.6 % — ABNORMAL LOW (ref 38.5–50.0)
Hemoglobin: 12.6 g/dL — ABNORMAL LOW (ref 13.2–17.1)
Lymphs Abs: 2059 cells/uL (ref 850–3900)
MCH: 34.5 pg — ABNORMAL HIGH (ref 27.0–33.0)
MCHC: 34.4 g/dL (ref 32.0–36.0)
MCV: 100.3 fL — ABNORMAL HIGH (ref 80.0–100.0)
MPV: 9.4 fL (ref 7.5–12.5)
Monocytes Relative: 9.8 %
Neutro Abs: 3127 cells/uL (ref 1500–7800)
Neutrophils Relative %: 53 %
Platelets: 362 10*3/uL (ref 140–400)
RBC: 3.65 10*6/uL — ABNORMAL LOW (ref 4.20–5.80)
RDW: 11.7 % (ref 11.0–15.0)
Total Lymphocyte: 34.9 %
WBC: 5.9 10*3/uL (ref 3.8–10.8)

## 2020-02-11 LAB — COMPLETE METABOLIC PANEL WITH GFR
AG Ratio: 1.8 (calc) (ref 1.0–2.5)
ALT: 14 U/L (ref 9–46)
AST: 14 U/L (ref 10–40)
Albumin: 4.3 g/dL (ref 3.6–5.1)
Alkaline phosphatase (APISO): 34 U/L — ABNORMAL LOW (ref 36–130)
BUN: 13 mg/dL (ref 7–25)
CO2: 27 mmol/L (ref 20–32)
Calcium: 9.7 mg/dL (ref 8.6–10.3)
Chloride: 103 mmol/L (ref 98–110)
Creat: 0.72 mg/dL (ref 0.60–1.35)
GFR, Est African American: 150 mL/min/{1.73_m2} (ref >=60)
GFR, Est Non African American: 130 mL/min/{1.73_m2} (ref >=60)
Globulin: 2.4 g/dL (calc) (ref 1.9–3.7)
Glucose, Bld: 74 mg/dL (ref 65–99)
Potassium: 4.3 mmol/L (ref 3.5–5.3)
Sodium: 136 mmol/L (ref 135–146)
Total Bilirubin: 0.4 mg/dL (ref 0.2–1.2)
Total Protein: 6.7 g/dL (ref 6.1–8.1)

## 2020-02-11 LAB — HIV-1 RNA QUANT-NO REFLEX-BLD
HIV 1 RNA Quant: 24 copies/mL — ABNORMAL HIGH
HIV-1 RNA Quant, Log: 1.38 Log copies/mL — ABNORMAL HIGH

## 2020-02-11 LAB — RPR: RPR Ser Ql: NONREACTIVE

## 2020-02-11 NOTE — Telephone Encounter (Signed)
Good thing we treated her. COntacts need to be treated and tested, (including HIV --though fortunatley Travis Palmer has nicley suppressed VL. Good thing U=U

## 2020-02-11 NOTE — Telephone Encounter (Signed)
Pt's rectal swab tested pos for Gonorrhea. Received Rocephin 500 mg on  03/03.  Neisseria Gonorrhea PositiveAbnormal    Chlamydia Negative   Comment Normal Reference [place] Chlamydia - Negative   Comment Normal Reference Range Neisseria Gonorrhea - Negative

## 2020-02-11 NOTE — Telephone Encounter (Signed)
Patient informed. Verbalized understanding. [place]

## 2020-03-07 ENCOUNTER — Other Ambulatory Visit: Payer: Self-pay

## 2020-03-07 ENCOUNTER — Encounter: Payer: Self-pay | Admitting: Infectious Disease

## 2020-03-07 ENCOUNTER — Ambulatory Visit (INDEPENDENT_AMBULATORY_CARE_PROVIDER_SITE_OTHER): Payer: Self-pay | Admitting: Infectious Disease

## 2020-03-07 DIAGNOSIS — K6289 Other specified diseases of anus and rectum: Secondary | ICD-10-CM | POA: Insufficient documentation

## 2020-03-07 DIAGNOSIS — Z789 Other specified health status: Secondary | ICD-10-CM

## 2020-03-07 DIAGNOSIS — B20 Human immunodeficiency virus [HIV] disease: Secondary | ICD-10-CM

## 2020-03-07 DIAGNOSIS — Z599 Problem related to housing and economic circumstances, unspecified: Secondary | ICD-10-CM

## 2020-03-07 DIAGNOSIS — Z113 Encounter for screening for infections with a predominantly sexual mode of transmission: Secondary | ICD-10-CM

## 2020-03-07 DIAGNOSIS — A549 Gonococcal infection, unspecified: Secondary | ICD-10-CM

## 2020-03-07 DIAGNOSIS — F640 Transsexualism: Secondary | ICD-10-CM

## 2020-03-07 HISTORY — DX: Other specified diseases of anus and rectum: K62.89

## 2020-03-07 NOTE — Progress Notes (Signed)
Virtual Visit via Telephone Note  I connected with Travis Palmer on 03/07/20 at 10:00 AM EDT by telephone and verified that I am speaking with the correct person using two identifiers.  Location: Patient: Home (friends apartment) Provider: [facility]   I discussed the limitations, risks, security and privacy concerns of performing an evaluation and management service by telephone and the availability of in person appointments. I also discussed with the patient that there may be a patient responsible charge related to this service. The patient expressed understanding and agreed to proceed.   History of Present Illness:  Travis Palmer is a 24 -year-old African-American transgender woman living with HIV that has been well controlled on [place].  She has unfortunately been having to stay at friends places because she still does not have her own apartment at her home.  She believes that someone from one of the organizations at our [facility] had called her to try to help her with this.  She has however in a safe residence and is in good spirits.  He has renewed her HMA P program.  I saw her in clinic a few weeks ago when she came in for routine visit but also was complaining of rectal pain.  We treated her presumptively for gonorrhea and chlamydia with ceftriaxone 500 mg and azithromycin 1 g and also gave her doxycycline.  Her rectal screen for gonorrhea came back positive but oral and urine were negative for gonorrhea and chlamydia.  Syphilis test was negative her HIV remains nicely suppressed on Biktarvy.  Since I last saw her her rectal pain is completely resolved.  [name] claims to be using condoms with intercourse.  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  Review of systems as in HPI otherwise 12 point review systems negative  Past Medical History:  Diagnosis Date  . Epigastric pain 10/01/2017  . History of bipolar disorder   . HIV infection ([place])    . Housing problems 08/26/2016  . Rectal pain 03/07/2020  . Routine screening for STI (sexually transmitted infection) 01/26/2018  . Syphilis 08/02/2015    Past Surgical History:  Procedure Laterality Date  . ENDOSCOPIC RETROGRADE CHOLANGIOPANCREATOGRAPHY (ERCP) WITH PROPOFOL N/A 02/12/2018   Procedure: ENDOSCOPIC RETROGRADE CHOLANGIOPANCREATOGRAPHY (ERCP) WITH PROPOFOL;  Surgeon: Ladene Artist, MD;  Location: [facility];  Service: Endoscopy;  Laterality: N/A;  . WISDOM TOOTH EXTRACTION  2014    Family History  Problem Relation Age of Onset  . Lactose intolerance Mother   . Cancer Maternal Aunt   . Hypertension Maternal Grandmother       Social History   Socioeconomic History  . Marital status: Single    Spouse name: Not on file  . Number of children: Not on file  . Years of education: Not on file  . Highest education level: Not on file  Occupational History  . Not on file  Tobacco Use  . Smoking status: Former Smoker    Packs/day: 0.00    Types: Cigarettes  . Smokeless tobacco: Never Used  Substance and Sexual Activity  . Alcohol use: Yes    Alcohol/week: 0.0 standard drinks    Comment: weekend drinker  . Drug use: Not Currently    Frequency: 7.0 times per week    Types: Marijuana    Comment: every other day  . Sexual activity: Not Currently    Comment: given condoms  Other Topics Concern  . Not on file  Social History Narrative  . Not on file   Social Determinants of Health   Financial Resource Strain:   . Difficulty of Paying Living Expenses:   Food Insecurity:   . Worried About Charity fundraiser in the Last Year:   . Arboriculturist in the Last Year:   Transportation Needs:   . Film/video editor (Medical):   Marland Kitchen Lack of Transportation (Non-Medical):   Physical Activity:   . Days of Exercise per Week:   . Minutes of Exercise per Session:   Stress:   . Feeling of Stress :   Social Connections:   . Frequency of Communication with Friends and  Family:   . Frequency of Social Gatherings with Friends and Family:   . Attends Religious Services:   . Active Member of Clubs or Organizations:   . Attends Archivist Meetings:   Marland Kitchen Marital Status:     Allergies  Allergen Reactions  . Apple Itching, Swelling and Other (See Comments)    Mouth swells  . Depakote [Divalproex Sodium] Other (See Comments)    Hospitalized for 3 days for extreme GI upset because of this     Current Outpatient Medications:  .  bictegravir-emtricitabine-tenofovir AF (BIKTARVY) 50-200-25 MG TABS tablet, Take 1 tablet by mouth daily., Disp: 30 tablet, Rfl: 11 .  dicyclomine (BENTYL) 20 MG tablet, TAKE 1 TABLET BY MOUTH THREE TIMES A DAY AS NEEDED (ABDOMINAL PAIN) FOR UP TO 7 DAYS, Disp: , Rfl:  .  doxycycline (VIBRA-TABS) 100 MG tablet, One tablet q12 x 7 days then TWO tablets after any unprotected sex, Disp: 60 tablet, Rfl: 11 .  estradiol valerate (DELESTROGEN) 20 MG/ML injection, Inject 0.75 mLs (15 mg total) into the muscle once a week., Disp: 5 mL, Rfl: 11 .  spironolactone (ALDACTONE) 100 MG tablet, Take 1 tablet (100 mg total) by mouth every morning., Disp: 30 tablet, Rfl: 11 .  spironolactone (ALDACTONE) 50 MG tablet, Take 1 tablet (50 mg total) by mouth at bedtime., Disp: 30 tablet, Rfl: 11 .  SYRINGE-NEEDLE, DISP, 3 ML 25G X 1-1/2" 3 ML MISC, Use as directed, Disp: 4 each, Rfl: 11 .  Syringe/Needle, Disp, (SYRINGE 3CC/21GX1-1/2") 21G X 1-1/2" 3 ML MISC, Use as directed with delestrogen, Disp: 4 each, Rfl: 5      Observations/Objective:  Travis Palmer  seemed in good spirits and also seemed happy re her VL  Assessment and Plan: HIV disease continue Biktarvy.  Note she is frustrated that she gets multiple deliveries or fill dates are different on her medications from [facility]   I have suggested use the HMA P program to obtain an ACA plan and to talk to our financial counselor regards to this.  We will bring her back in August and set up a phone  visit also with our counselor to make sure she renew his HMA P.  Program.  Gonorrhea: This is been treated partners need to be tested and treated.  Transgender: Continue current therapies  Homelessness: We will see if central [place] network can help or one of the other aid services organizations can help with housing for her.  My recollection is that housing is unfortunately in short supply in [place] at this point in time.   Follow Up Instructions:    I discussed the assessment and treatment plan with the patient. The patient was provided an opportunity to ask questions and all were answered. The patient agreed with the plan and demonstrated an understanding  of the instructions.   The patient was advised to call back or seek an in-person evaluation if the symptoms worsen or if the condition fails to improve as anticipated.  I provided 21 minutes of non-face-to-face time during this encounter.   Alcide Evener, MD

## 2020-03-13 ENCOUNTER — Other Ambulatory Visit: Payer: Self-pay

## 2020-03-13 ENCOUNTER — Telehealth: Payer: Self-pay

## 2020-03-13 NOTE — Telephone Encounter (Signed)
Patient called office today upset about needle sizes for hormones. States she received 21 G and states  they are too small for her to inject hormones. Patient would like to know if MD would be able to send in new prescription for bigger gauge needles. Will forward message to MD to advise on request.  Aundria Rud, CMA

## 2020-03-13 NOTE — Telephone Encounter (Signed)
I do not know what size needles are supposed to be used here. Cassie any thoughts and is this now some change in her rx?

## 2020-03-15 NOTE — Telephone Encounter (Signed)
She has called me about this before. I sent in other ones and even called [facility] to clarify. I am really not sure which ones she wants because I sent in the exact ones she told me to before. It may be that the ones she wants do not come with or aren't supposed to be used with what she is injecting? I'm not sure how to help her with this?

## 2020-03-15 NOTE — Telephone Encounter (Signed)
Maybe Travis Palmer can have pharmacist at her pharmacy go into details re which syringes she has been using that she likes and what we should be ordering?

## 2020-03-16 ENCOUNTER — Other Ambulatory Visit: Payer: Self-pay

## 2020-03-16 DIAGNOSIS — IMO0001 Reserved for inherently not codable concepts without codable children: Secondary | ICD-10-CM

## 2020-03-16 DIAGNOSIS — F640 Transsexualism: Secondary | ICD-10-CM

## 2020-03-16 MED ORDER — "SYRINGE 21G X 1-1/2"" 3 ML MISC": 1 refills | Status: DC

## 2020-03-16 NOTE — Telephone Encounter (Signed)
Attempted to call patient to follow up on concerns regarding needle/syringes. Both numbers listed in chart are not in service.  [place]

## 2020-03-16 NOTE — Telephone Encounter (Signed)
Called [facility] regarding prescription for syringe/ needles. Pharmacy states patient is using both 21 G and 25 G to inject HRT.  Has no refills 21 G gave verbal order to refill with one additional refill. Patient has not reached out to pharmacy with any concerns. Will reach out to patient later today. [place]

## 2020-03-20 ENCOUNTER — Other Ambulatory Visit: Payer: Self-pay | Admitting: Infectious Disease

## 2020-03-20 DIAGNOSIS — Z789 Other specified health status: Secondary | ICD-10-CM

## 2020-03-20 DIAGNOSIS — F640 Transsexualism: Secondary | ICD-10-CM

## 2020-07-24 ENCOUNTER — Ambulatory Visit: Payer: Self-pay | Admitting: Infectious Disease

## 2020-07-26 ENCOUNTER — Encounter: Payer: Self-pay | Admitting: Infectious Disease

## 2020-07-26 ENCOUNTER — Other Ambulatory Visit: Payer: Self-pay

## 2020-07-26 ENCOUNTER — Ambulatory Visit: Payer: Self-pay

## 2020-07-26 ENCOUNTER — Ambulatory Visit (INDEPENDENT_AMBULATORY_CARE_PROVIDER_SITE_OTHER): Payer: Self-pay | Admitting: Infectious Disease

## 2020-07-26 VITALS — BP 133/79 | HR 79 | Wt 183.0 lb

## 2020-07-26 DIAGNOSIS — B20 Human immunodeficiency virus [HIV] disease: Secondary | ICD-10-CM

## 2020-07-26 DIAGNOSIS — Z789 Other specified health status: Secondary | ICD-10-CM

## 2020-07-26 DIAGNOSIS — A549 Gonococcal infection, unspecified: Secondary | ICD-10-CM

## 2020-07-26 DIAGNOSIS — A539 Syphilis, unspecified: Secondary | ICD-10-CM

## 2020-07-26 DIAGNOSIS — F640 Transsexualism: Secondary | ICD-10-CM

## 2020-07-26 DIAGNOSIS — A749 Chlamydial infection, unspecified: Secondary | ICD-10-CM

## 2020-07-26 DIAGNOSIS — Z113 Encounter for screening for infections with a predominantly sexual mode of transmission: Secondary | ICD-10-CM

## 2020-07-26 NOTE — Patient Instructions (Signed)
Make phone/video visit in 4-6 weeks to review labs

## 2020-07-26 NOTE — Progress Notes (Signed)
Subjective:  Chief complaint: followup for HIV disease    Patient ID: Travis Palmer, adult    DOB: Dec 23, 1995, 24 y.o.   MRN: [id number]  HPI  Travis Palmer is a 24 year old African American Transgender woman living with HIV that has been well controlled.   She has not received COVID  Vaccine but I briefly tried to advocate for this. She did want to be tested for STIs and genital and extragenital sites along with other tests.      Past Medical History:  Diagnosis Date  . Epigastric pain 10/01/2017  . History of bipolar disorder   . HIV infection ([place])   . Housing problems 08/26/2016  . Rectal pain 03/07/2020  . Routine screening for STI (sexually transmitted infection) 01/26/2018  . Syphilis 08/02/2015    Past Surgical History:  Procedure Laterality Date  . ENDOSCOPIC RETROGRADE CHOLANGIOPANCREATOGRAPHY (ERCP) WITH PROPOFOL N/A 02/12/2018   Procedure: ENDOSCOPIC RETROGRADE CHOLANGIOPANCREATOGRAPHY (ERCP) WITH PROPOFOL;  Surgeon: Ladene Artist, MD;  Location: [facility];  Service: Endoscopy;  Laterality: N/A;  . WISDOM TOOTH EXTRACTION  2014    Family History  Problem Relation Age of Onset  . Lactose intolerance Mother   . Cancer Maternal Aunt   . Hypertension Maternal Grandmother       Social History   Socioeconomic History  . Marital status: Single    Spouse name: Not on file  . Number of children: Not on file  . Years of education: Not on file  . Highest education level: Not on file  Occupational History  . Not on file  Tobacco Use  . Smoking status: Former Smoker    Packs/day: 0.00    Types: Cigarettes  . Smokeless tobacco: Never Used  Vaping Use  . Vaping Use: Never used  Substance and Sexual Activity  . Alcohol use: Yes    Alcohol/week: 0.0 standard drinks    Comment: weekend drinker  . Drug use: Not Currently    Frequency: 7.0 times per week    Types: Marijuana    Comment: every other day  . Sexual activity: Not Currently    Comment: given condoms    Other Topics Concern  . Not on file  Social History Narrative  . Not on file   Social Determinants of Health   Financial Resource Strain:   . Difficulty of Paying Living Expenses:   Food Insecurity:   . Worried About Charity fundraiser in the Last Year:   . Arboriculturist in the Last Year:   Transportation Needs:   . Film/video editor (Medical):   Marland Kitchen Lack of Transportation (Non-Medical):   Physical Activity:   . Days of Exercise per Week:   . Minutes of Exercise per Session:   Stress:   . Feeling of Stress :   Social Connections:   . Frequency of Communication with Friends and Family:   . Frequency of Social Gatherings with Friends and Family:   . Attends Religious Services:   . Active Member of Clubs or Organizations:   . Attends Archivist Meetings:   Marland Kitchen Marital Status:     Allergies  Allergen Reactions  . Apple Itching, Swelling and Other (See Comments)    Mouth swells  . Depakote [Divalproex Sodium] Other (See Comments)    Hospitalized for 3 days for extreme GI upset because of this     Current Outpatient Medications:  .  bictegravir-emtricitabine-tenofovir AF (BIKTARVY) 50-200-25 MG TABS tablet, Take 1  tablet by mouth daily., Disp: 30 tablet, Rfl: 11 .  doxycycline (VIBRA-TABS) 100 MG tablet, One tablet q12 x 7 days then TWO tablets after any unprotected sex, Disp: 60 tablet, Rfl: 11 .  estradiol valerate (DELESTROGEN) 20 MG/ML injection, Inject 0.75 mLs (15 mg total) into the muscle once a week., Disp: 5 mL, Rfl: 11 .  spironolactone (ALDACTONE) 100 MG tablet, Take 1 tablet (100 mg total) by mouth every morning., Disp: 30 tablet, Rfl: 11 .  spironolactone (ALDACTONE) 50 MG tablet, Take 1 tablet (50 mg total) by mouth at bedtime., Disp: 30 tablet, Rfl: 11 .  SYRINGE-NEEDLE, DISP, 3 ML 25G X 1-1/2" 3 ML MISC, Use as directed, Disp: 4 each, Rfl: 11 .  Syringe/Needle, Disp, (SYRINGE 3CC/21GX1-1/2") 21G X 1-1/2" 3 ML MISC, Use as directed with  delestrogen, Disp: 4 each, Rfl: 1 .  dicyclomine (BENTYL) 20 MG tablet, TAKE 1 TABLET BY MOUTH THREE TIMES A DAY AS NEEDED (ABDOMINAL PAIN) FOR UP TO 7 DAYS (Patient not taking: Reported on 07/26/2020), Disp: , Rfl:    Review of Systems  Constitutional: Negative for activity change, appetite change, chills, diaphoresis, fatigue, fever and unexpected weight change.  HENT: Negative for congestion, rhinorrhea, sinus pressure, sneezing, sore throat and trouble swallowing.   Eyes: Negative for photophobia and visual disturbance.  Respiratory: Negative for cough, chest tightness, shortness of breath, wheezing and stridor.   Cardiovascular: Negative for chest pain, palpitations and leg swelling.  Gastrointestinal: Negative for abdominal distention, abdominal pain, anal bleeding, blood in stool, constipation, diarrhea, nausea and vomiting.  Genitourinary: Negative for difficulty urinating, dysuria, flank pain and hematuria.  Musculoskeletal: Negative for arthralgias, back pain, gait problem, joint swelling and myalgias.  Skin: Negative for color change, pallor, rash and wound.  Neurological: Negative for dizziness, tremors, weakness and light-headedness.  Hematological: Negative for adenopathy. Does not bruise/bleed easily.  Psychiatric/Behavioral: Negative for agitation, behavioral problems, confusion, decreased concentration, dysphoric mood and sleep disturbance.       Objective:   Physical Exam Constitutional:      General: [name] not in acute distress.    Appearance: Normal appearance. [name] well-developed. [name] not ill-appearing or diaphoretic.  HENT:     Head: Normocephalic and atraumatic.     Right Ear: Hearing and external ear normal.     Left Ear: Hearing and external ear normal.     Nose: No nasal deformity or rhinorrhea.  Eyes:     General: No scleral icterus.    Conjunctiva/sclera: Conjunctivae normal.     Right eye: Right conjunctiva is not injected.     Left eye: Left  conjunctiva is not injected.     Pupils: Pupils are equal, round, and reactive to light.  Neck:     Vascular: No JVD.  Cardiovascular:     Rate and Rhythm: Normal rate and regular rhythm.     Heart sounds: Normal heart sounds, S1 normal and S2 normal. No murmur heard.  No friction rub.  Abdominal:     General: Bowel sounds are normal. There is no distension.     Palpations: Abdomen is soft.     Tenderness: There is no abdominal tenderness.  Musculoskeletal:        General: Normal range of motion.     Right shoulder: Normal.     Left shoulder: Normal.     Cervical back: Normal range of motion and neck supple.     Right hip: Normal.     Left hip: Normal.  Right knee: Normal.     Left knee: Normal.  Lymphadenopathy:     Head:     Right side of head: No submandibular, preauricular or posterior auricular adenopathy.     Left side of head: No submandibular, preauricular or posterior auricular adenopathy.     Cervical: No cervical adenopathy.     Right cervical: No superficial or deep cervical adenopathy.    Left cervical: No superficial or deep cervical adenopathy.  Skin:    General: Skin is warm and dry.     Coloration: Skin is not pale.     Findings: No abrasion, bruising, ecchymosis, erythema, lesion or rash.     Nails: There is no clubbing.  Neurological:     Mental Status: [name] alert and oriented to person, place, and time.     Sensory: No sensory deficit.     Coordination: Coordination normal.     Gait: Gait normal.  Psychiatric:        Attention and Perception: [name] attentive.        Mood and Affect: Mood normal.        Speech: Speech normal.        Behavior: Behavior normal. Behavior is cooperative.        Thought Content: Thought content normal.        Judgment: Judgment normal.           Assessment & Plan:   HIV disease: check labs today and continue Biktarvy  STI screening: checking GC and chlamydia in urine, OP, rectum, RPR in serum  Transgender  identity: continue aldactone and estradiol

## 2020-07-27 LAB — CYTOLOGY, (ORAL, ANAL, URETHRAL) ANCILLARY ONLY
Chlamydia: NEGATIVE
Chlamydia: NEGATIVE
Comment: NEGATIVE
Comment: NEGATIVE
Comment: NORMAL
Comment: NORMAL
Neisseria Gonorrhea: NEGATIVE
Neisseria Gonorrhea: NEGATIVE

## 2020-07-27 LAB — URINE CYTOLOGY ANCILLARY ONLY
Chlamydia: NEGATIVE
Comment: NEGATIVE
Comment: NORMAL
Neisseria Gonorrhea: NEGATIVE

## 2020-07-27 LAB — T-HELPER CELL (CD4) - (RCID CLINIC ONLY)
CD4 % Helper T Cell: 38 % (ref 33–65)
CD4 T Cell Abs: 706 /uL (ref 400–1790)

## 2020-07-29 LAB — COMPLETE METABOLIC PANEL WITH GFR
AG Ratio: 1.9 (calc) (ref 1.0–2.5)
ALT: 14 U/L (ref 9–46)
AST: 13 U/L (ref 10–40)
Albumin: 4.6 g/dL (ref 3.6–5.1)
Alkaline phosphatase (APISO): 34 U/L — ABNORMAL LOW (ref 36–130)
BUN: 14 mg/dL (ref 7–25)
CO2: 28 mmol/L (ref 20–32)
Calcium: 9.5 mg/dL (ref 8.6–10.3)
Chloride: 102 mmol/L (ref 98–110)
Creat: 0.8 mg/dL (ref 0.60–1.35)
GFR, Est African American: 143 mL/min/{1.73_m2} (ref >=60)
GFR, Est Non African American: 123 mL/min/{1.73_m2} (ref >=60)
Globulin: 2.4 g/dL (calc) (ref 1.9–3.7)
Glucose, Bld: 81 mg/dL (ref 65–99)
Potassium: 4.4 mmol/L (ref 3.5–5.3)
Sodium: 136 mmol/L (ref 135–146)
Total Bilirubin: 0.3 mg/dL (ref 0.2–1.2)
Total Protein: 7 g/dL (ref 6.1–8.1)

## 2020-07-29 LAB — CBC WITH DIFFERENTIAL/PLATELET
Absolute Monocytes: 449 cells/uL (ref 200–950)
Basophils Absolute: 51 cells/uL (ref 0–200)
Basophils Relative: 1 %
Eosinophils Absolute: 92 cells/uL (ref 15–500)
Eosinophils Relative: 1.8 %
HCT: 38 % — ABNORMAL LOW (ref 38.5–50.0)
Hemoglobin: 13.1 g/dL — ABNORMAL LOW (ref 13.2–17.1)
Lymphs Abs: 1994 cells/uL (ref 850–3900)
MCH: 34.8 pg — ABNORMAL HIGH (ref 27.0–33.0)
MCHC: 34.5 g/dL (ref 32.0–36.0)
MCV: 101.1 fL — ABNORMAL HIGH (ref 80.0–100.0)
MPV: 9.7 fL (ref 7.5–12.5)
Monocytes Relative: 8.8 %
Neutro Abs: 2514 cells/uL (ref 1500–7800)
Neutrophils Relative %: 49.3 %
Platelets: 341 10*3/uL (ref 140–400)
RBC: 3.76 10*6/uL — ABNORMAL LOW (ref 4.20–5.80)
RDW: 11.9 % (ref 11.0–15.0)
Total Lymphocyte: 39.1 %
WBC: 5.1 10*3/uL (ref 3.8–10.8)

## 2020-07-29 LAB — RPR: RPR Ser Ql: NONREACTIVE

## 2020-07-29 LAB — HIV-1 RNA QUANT-NO REFLEX-BLD
HIV 1 RNA Quant: <20 Copies/mL
HIV-1 RNA Quant, Log: <1.3 Log cps/mL

## 2020-07-31 ENCOUNTER — Encounter: Payer: Self-pay | Admitting: Infectious Disease

## 2020-10-06 ENCOUNTER — Other Ambulatory Visit: Payer: Self-pay | Admitting: Pharmacist

## 2020-10-06 DIAGNOSIS — Z789 Other specified health status: Secondary | ICD-10-CM

## 2020-10-25 ENCOUNTER — Other Ambulatory Visit: Payer: Self-pay | Admitting: Pharmacist

## 2020-10-25 DIAGNOSIS — Z789 Other specified health status: Secondary | ICD-10-CM

## 2020-11-16 ENCOUNTER — Other Ambulatory Visit: Payer: Self-pay | Admitting: Family

## 2020-11-16 DIAGNOSIS — IMO0001 Reserved for inherently not codable concepts without codable children: Secondary | ICD-10-CM

## 2020-11-16 DIAGNOSIS — F640 Transsexualism: Secondary | ICD-10-CM

## 2020-12-13 ENCOUNTER — Other Ambulatory Visit: Payer: Self-pay | Admitting: Pharmacist

## 2020-12-13 DIAGNOSIS — Z789 Other specified health status: Secondary | ICD-10-CM

## 2020-12-14 ENCOUNTER — Telehealth: Payer: Self-pay

## 2020-12-14 NOTE — Telephone Encounter (Signed)
Call can not be completed at the time, will try again later. Patient needs a lab and office visit

## 2020-12-15 ENCOUNTER — Other Ambulatory Visit: Payer: Self-pay

## 2020-12-15 DIAGNOSIS — B20 Human immunodeficiency virus [HIV] disease: Secondary | ICD-10-CM

## 2020-12-18 ENCOUNTER — Other Ambulatory Visit: Payer: Self-pay | Admitting: Infectious Disease

## 2020-12-18 DIAGNOSIS — IMO0001 Reserved for inherently not codable concepts without codable children: Secondary | ICD-10-CM

## 2020-12-18 DIAGNOSIS — Z79899 Other long term (current) drug therapy: Secondary | ICD-10-CM

## 2020-12-22 LAB — CBC WITH DIFFERENTIAL/PLATELET
Absolute Monocytes: 441 cells/uL (ref 200–950)
Basophils Absolute: 59 cells/uL (ref 0–200)
Basophils Relative: 1.3 %
Eosinophils Absolute: 59 cells/uL (ref 15–500)
Eosinophils Relative: 1.3 %
HCT: 36.9 % — ABNORMAL LOW (ref 38.5–50.0)
Hemoglobin: 12.6 g/dL — ABNORMAL LOW (ref 13.2–17.1)
Lymphs Abs: 1629 cells/uL (ref 850–3900)
MCH: 34.1 pg — ABNORMAL HIGH (ref 27.0–33.0)
MCHC: 34.1 g/dL (ref 32.0–36.0)
MCV: 100 fL (ref 80.0–100.0)
MPV: 9.7 fL (ref 7.5–12.5)
Monocytes Relative: 9.8 %
Neutro Abs: 2313 cells/uL (ref 1500–7800)
Neutrophils Relative %: 51.4 %
Platelets: 374 10*3/uL (ref 140–400)
RBC: 3.69 10*6/uL — ABNORMAL LOW (ref 4.20–5.80)
RDW: 11.8 % (ref 11.0–15.0)
Total Lymphocyte: 36.2 %
WBC: 4.5 10*3/uL (ref 3.8–10.8)

## 2020-12-22 LAB — COMPLETE METABOLIC PANEL WITH GFR
AG Ratio: 1.8 (calc) (ref 1.0–2.5)
ALT: 71 U/L — ABNORMAL HIGH (ref 9–46)
AST: 17 U/L (ref 10–40)
Albumin: 4.3 g/dL (ref 3.6–5.1)
Alkaline phosphatase (APISO): 51 U/L (ref 36–130)
BUN: 11 mg/dL (ref 7–25)
CO2: 26 mmol/L (ref 20–32)
Calcium: 9.1 mg/dL (ref 8.6–10.3)
Chloride: 104 mmol/L (ref 98–110)
Creat: 0.67 mg/dL (ref 0.60–1.35)
GFR, Est African American: 154 mL/min/{1.73_m2} (ref >=60)
GFR, Est Non African American: 133 mL/min/{1.73_m2} (ref >=60)
Globulin: 2.4 g/dL (calc) (ref 1.9–3.7)
Glucose, Bld: 93 mg/dL (ref 65–99)
Potassium: 3.9 mmol/L (ref 3.5–5.3)
Sodium: 137 mmol/L (ref 135–146)
Total Bilirubin: 0.3 mg/dL (ref 0.2–1.2)
Total Protein: 6.7 g/dL (ref 6.1–8.1)

## 2020-12-22 LAB — HIV-1 RNA QUANT-NO REFLEX-BLD
HIV 1 RNA Quant: <20 Copies/mL
HIV-1 RNA Quant, Log: <1.3 Log cps/mL

## 2020-12-22 LAB — RPR: RPR Ser Ql: NONREACTIVE

## 2021-01-03 ENCOUNTER — Other Ambulatory Visit: Payer: Self-pay

## 2021-01-03 ENCOUNTER — Telehealth (INDEPENDENT_AMBULATORY_CARE_PROVIDER_SITE_OTHER): Payer: Self-pay | Admitting: Infectious Disease

## 2021-01-03 ENCOUNTER — Encounter: Payer: Self-pay | Admitting: Infectious Disease

## 2021-01-03 DIAGNOSIS — Z599 Problem related to housing and economic circumstances, unspecified: Secondary | ICD-10-CM

## 2021-01-03 DIAGNOSIS — A539 Syphilis, unspecified: Secondary | ICD-10-CM

## 2021-01-03 DIAGNOSIS — D539 Nutritional anemia, unspecified: Secondary | ICD-10-CM

## 2021-01-03 DIAGNOSIS — F32A Depression, unspecified: Secondary | ICD-10-CM

## 2021-01-03 DIAGNOSIS — B20 Human immunodeficiency virus [HIV] disease: Secondary | ICD-10-CM

## 2021-01-03 DIAGNOSIS — A549 Gonococcal infection, unspecified: Secondary | ICD-10-CM

## 2021-01-03 DIAGNOSIS — Z113 Encounter for screening for infections with a predominantly sexual mode of transmission: Secondary | ICD-10-CM

## 2021-01-03 DIAGNOSIS — F331 Major depressive disorder, recurrent, moderate: Secondary | ICD-10-CM

## 2021-01-03 DIAGNOSIS — R1013 Epigastric pain: Secondary | ICD-10-CM

## 2021-01-03 DIAGNOSIS — F17210 Nicotine dependence, cigarettes, uncomplicated: Secondary | ICD-10-CM

## 2021-01-03 DIAGNOSIS — A749 Chlamydial infection, unspecified: Secondary | ICD-10-CM

## 2021-01-03 DIAGNOSIS — Z789 Other specified health status: Secondary | ICD-10-CM

## 2021-01-03 HISTORY — DX: Depression, unspecified: F32.A

## 2021-01-03 HISTORY — DX: Nutritional anemia, unspecified: D53.9

## 2021-01-03 NOTE — Progress Notes (Signed)
Virtual Visit via Telephone Note  I connected with Travis Palmer on 01/03/21 at  3:00 PM EST by telephone and verified that I am speaking with the correct person using two identifiers.  Location: Patient: [facility] Provider: [facility]   I discussed the limitations, risks, security and privacy concerns of performing an evaluation and management service by telephone and the availability of in person appointments. I also discussed with the patient that there may be a patient responsible charge related to this service. The patient expressed understanding and agreed to proceed.   History of Present Illness:  Travis Palmer is a 25 year old and gender black woman who is highly adherent to her antiretroviral regimen of Biktarvy with an undetectable viral load.  [name] still taking doxycycline for postexposure prophylaxis to prevent chlamydia and syphilis.  She was 90 that she thought she was being tested for sexually transmitted infections when she had her labs drawn.  She did have a syphilis done from blood but has not had any GC chlamydia testing.  I have arranged for her to have that done via a lab visit when she comes to meet with Marcie Bal next week.  He is continuing to take her estradiol injections long with spironolactone.  [name] still unfortunately homeless and has been living at different peoples apartments and houses in the interim.  She says that she is safe in the places that she has been staying.  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  [name] going to meet with Marcie Bal next week for counseling.  Past Medical History:  Diagnosis Date  . Epigastric pain 10/01/2017  . History of bipolar disorder   . HIV infection ([place])   . Housing problems 08/26/2016  . Rectal pain 03/07/2020  . Routine screening for STI (sexually transmitted infection) 01/26/2018  . Syphilis 08/02/2015    Past Surgical History:  Procedure Laterality Date  . ENDOSCOPIC RETROGRADE  CHOLANGIOPANCREATOGRAPHY (ERCP) WITH PROPOFOL N/A 02/12/2018   Procedure: ENDOSCOPIC RETROGRADE CHOLANGIOPANCREATOGRAPHY (ERCP) WITH PROPOFOL;  Surgeon: Ladene Artist, MD;  Location: [facility];  Service: Endoscopy;  Laterality: N/A;  . WISDOM TOOTH EXTRACTION  2014    Family History  Problem Relation Age of Onset  . Lactose intolerance Mother   . Cancer Maternal Aunt   . Hypertension Maternal Grandmother       Social History   Socioeconomic History  . Marital status: Single    Spouse name: Not on file  . Number of children: Not on file  . Years of education: Not on file  . Highest education level: Not on file  Occupational History  . Not on file  Tobacco Use  . Smoking status: Former Smoker    Packs/day: 0.00    Types: Cigarettes  . Smokeless tobacco: Never Used  Vaping Use  . Vaping Use: Never used  Substance and Sexual Activity  . Alcohol use: Yes    Alcohol/week: 0.0 standard drinks    Comment: weekend drinker  . Drug use: Not Currently    Frequency: 7.0 times per week    Types: Marijuana    Comment: every other day  . Sexual activity: Not Currently    Comment: given condoms  Other Topics Concern  . Not on file  Social History Narrative  . Not on file   Social Determinants of Health   Financial Resource Strain: Not on file  Food Insecurity: Not on file  Transportation Needs: Not on file  Physical  Activity: Not on file  Stress: Not on file  Social Connections: Not on file    Allergies  Allergen Reactions  . Apple Itching, Swelling and Other (See Comments)    Mouth swells  . Depakote [Divalproex Sodium] Other (See Comments)    Hospitalized for 3 days for extreme GI upset because of this     Current Outpatient Medications:  .  bictegravir-emtricitabine-tenofovir AF (BIKTARVY) 50-200-25 MG TABS tablet, Take 1 tablet by mouth daily., Disp: 30 tablet, Rfl: 11 .  doxycycline (VIBRA-TABS) 100 MG tablet, One tablet q12 x 7 days then TWO tablets after  any unprotected sex, Disp: 60 tablet, Rfl: 11 .  estradiol valerate (DELESTROGEN) 20 MG/ML injection, ADMINISTER 0.75 ML(15 MG) IN THE MUSCLE 1 TIME A WEEK, Disp: 5 mL, Rfl: 1 .  spironolactone (ALDACTONE) 100 MG tablet, TAKE 1 TABLET(100 MG) BY MOUTH EVERY MORNING, Disp: 30 tablet, Rfl: 1 .  spironolactone (ALDACTONE) 50 MG tablet, TAKE 1 TABLET(50 MG) BY MOUTH AT BEDTIME, Disp: 30 tablet, Rfl: 1 .  SYRINGE-NEEDLE, DISP, 3 ML (B-D 3CC LUER-LOK SYR 21GX1-1/2) 21G X 1-1/2" 3 ML MISC, USE WITH DELESTROGEN AS DIRECTED, Disp: 4 each, Rfl: 0 .  SYRINGE-NEEDLE, DISP, 3 ML 25G X 1-1/2" 3 ML MISC, Use as directed, Disp: 4 each, Rfl: 11    Observations/Objective:  Travis Palmer.  To be in relatively good spirits over the telephone.  T  Assessment and Plan: HIV disease continue Biktarvy  Major depression continue to meet with Marcie Bal consider SSRI  Transgender health: Continue with her current therapies of estradiol and spironolactone.  STI testing as mentioned we will screen for gonorrhea chlamydia genital and extra genital sites.  Macrocytic anemia check folate B12 iron panel when she comes for labs next  Follow Up Instructions:    I discussed the assessment and treatment plan with the patient. The patient was provided an opportunity to ask questions and all were answered. The patient agreed with the plan and demonstrated an understanding of the instructions.   The patient was advised to call back or seek an in-person evaluation if the symptoms worsen or if the condition fails to improve as anticipated.  I provided 25 minutes of non-face-to-face time during this encounter.   Alcide Evener, MD

## 2021-01-11 ENCOUNTER — Ambulatory Visit: Payer: Self-pay

## 2021-01-11 ENCOUNTER — Other Ambulatory Visit: Payer: Self-pay

## 2021-01-11 DIAGNOSIS — Z789 Other specified health status: Secondary | ICD-10-CM

## 2021-01-11 DIAGNOSIS — B20 Human immunodeficiency virus [HIV] disease: Secondary | ICD-10-CM

## 2021-01-12 LAB — URINE CYTOLOGY ANCILLARY ONLY
Chlamydia: NEGATIVE
Comment: NEGATIVE
Comment: NORMAL
Neisseria Gonorrhea: NEGATIVE

## 2021-01-12 LAB — T-HELPER CELL (CD4) - (RCID CLINIC ONLY)
CD4 % Helper T Cell: 38 % (ref 33–65)
CD4 T Cell Abs: 847 /uL (ref 400–1790)

## 2021-01-15 LAB — COMPLETE METABOLIC PANEL WITH GFR
AG Ratio: 1.8 (calc) (ref 1.0–2.5)
ALT: 9 U/L (ref 9–46)
AST: 12 U/L (ref 10–40)
Albumin: 4 g/dL (ref 3.6–5.1)
Alkaline phosphatase (APISO): 32 U/L — ABNORMAL LOW (ref 36–130)
BUN: 10 mg/dL (ref 7–25)
CO2: 24 mmol/L (ref 20–32)
Calcium: 8.8 mg/dL (ref 8.6–10.3)
Chloride: 105 mmol/L (ref 98–110)
Creat: 0.71 mg/dL (ref 0.60–1.35)
GFR, Est African American: 150 mL/min/{1.73_m2} (ref >=60)
GFR, Est Non African American: 129 mL/min/{1.73_m2} (ref >=60)
Globulin: 2.2 g/dL (calc) (ref 1.9–3.7)
Glucose, Bld: 86 mg/dL (ref 65–99)
Potassium: 4.3 mmol/L (ref 3.5–5.3)
Sodium: 138 mmol/L (ref 135–146)
Total Bilirubin: 0.4 mg/dL (ref 0.2–1.2)
Total Protein: 6.2 g/dL (ref 6.1–8.1)

## 2021-01-15 LAB — CBC WITH DIFFERENTIAL/PLATELET
Absolute Monocytes: 586 cells/uL (ref 200–950)
Basophils Absolute: 50 cells/uL (ref 0–200)
Basophils Relative: 0.8 %
Eosinophils Absolute: 107 cells/uL (ref 15–500)
Eosinophils Relative: 1.7 %
HCT: 36.1 % — ABNORMAL LOW (ref 38.5–50.0)
Hemoglobin: 12.3 g/dL — ABNORMAL LOW (ref 13.2–17.1)
Lymphs Abs: 2363 cells/uL (ref 850–3900)
MCH: 33.3 pg — ABNORMAL HIGH (ref 27.0–33.0)
MCHC: 34.1 g/dL (ref 32.0–36.0)
MCV: 97.8 fL (ref 80.0–100.0)
MPV: 10 fL (ref 7.5–12.5)
Monocytes Relative: 9.3 %
Neutro Abs: 3194 cells/uL (ref 1500–7800)
Neutrophils Relative %: 50.7 %
Platelets: 326 10*3/uL (ref 140–400)
RBC: 3.69 10*6/uL — ABNORMAL LOW (ref 4.20–5.80)
RDW: 11.8 % (ref 11.0–15.0)
Total Lymphocyte: 37.5 %
WBC: 6.3 10*3/uL (ref 3.8–10.8)

## 2021-01-15 LAB — HIV-1 RNA QUANT-NO REFLEX-BLD
HIV 1 RNA Quant: <20 Copies/mL — ABNORMAL HIGH
HIV-1 RNA Quant, Log: <1.3 Log cps/mL — ABNORMAL HIGH

## 2021-01-15 LAB — RPR: RPR Ser Ql: NONREACTIVE

## 2021-01-23 ENCOUNTER — Other Ambulatory Visit: Payer: Self-pay | Admitting: Pharmacist

## 2021-01-23 ENCOUNTER — Telehealth: Payer: Self-pay

## 2021-01-23 DIAGNOSIS — Z789 Other specified health status: Secondary | ICD-10-CM

## 2021-01-23 NOTE — Telephone Encounter (Signed)
Received refill request for spironolactone routing to provider if ok to approve refill request.  Travis Palmer

## 2021-01-25 NOTE — Telephone Encounter (Signed)
Ok to refill   thanks

## 2021-01-29 ENCOUNTER — Other Ambulatory Visit: Payer: Self-pay | Admitting: Infectious Disease

## 2021-01-29 DIAGNOSIS — F640 Transsexualism: Secondary | ICD-10-CM

## 2021-01-29 DIAGNOSIS — IMO0001 Reserved for inherently not codable concepts without codable children: Secondary | ICD-10-CM

## 2021-01-30 ENCOUNTER — Ambulatory Visit: Payer: Self-pay

## 2021-02-27 ENCOUNTER — Other Ambulatory Visit: Payer: Self-pay | Admitting: Infectious Disease

## 2021-02-27 DIAGNOSIS — B20 Human immunodeficiency virus [HIV] disease: Secondary | ICD-10-CM

## 2021-03-06 ENCOUNTER — Encounter: Payer: Self-pay | Admitting: Infectious Disease

## 2021-03-27 ENCOUNTER — Other Ambulatory Visit: Payer: Self-pay | Admitting: Infectious Diseases

## 2021-03-27 DIAGNOSIS — Z789 Other specified health status: Secondary | ICD-10-CM

## 2021-05-04 ENCOUNTER — Other Ambulatory Visit: Payer: Self-pay | Admitting: Pharmacist

## 2021-05-31 ENCOUNTER — Other Ambulatory Visit: Payer: Self-pay | Admitting: Infectious Diseases

## 2021-05-31 DIAGNOSIS — Z789 Other specified health status: Secondary | ICD-10-CM

## 2021-06-01 NOTE — Telephone Encounter (Signed)
Please advise re: spironolactone. There are currently 2 active Rxs, one for 50mg  at bedtime and one for 100mg  "q morning with 50mg ". There is pharmacy note 09/29/19 stating patient should be on both, but does not look like they are both being filled. Thanks!

## 2021-06-04 NOTE — Telephone Encounter (Signed)
Hmmm. You are right - but when I saw her in October, she was supposed to be taking 100 mg in AM and 50 mg in PM. I am not sure about the 150 mg in AM. Dr. Tommy Medal  may have increased her dose, but I don't see it mentioned in any of his notes. Will defer to him.

## 2021-06-05 ENCOUNTER — Other Ambulatory Visit: Payer: Self-pay

## 2021-06-05 DIAGNOSIS — B20 Human immunodeficiency virus [HIV] disease: Secondary | ICD-10-CM

## 2021-06-05 MED ORDER — DOXYCYCLINE HYCLATE 100 MG PO TABS: ORAL_TABLET | ORAL | 5 refills | Status: DC

## 2021-06-06 ENCOUNTER — Other Ambulatory Visit: Payer: Self-pay

## 2021-06-06 ENCOUNTER — Other Ambulatory Visit: Payer: Self-pay | Admitting: Family

## 2021-06-06 DIAGNOSIS — IMO0001 Reserved for inherently not codable concepts without codable children: Secondary | ICD-10-CM

## 2021-06-06 DIAGNOSIS — B20 Human immunodeficiency virus [HIV] disease: Secondary | ICD-10-CM

## 2021-06-06 MED ORDER — "BD LUER-LOK SYRINGE 21G X 1-1/2"" 3 ML MISC": 4 refills | Status: DC

## 2021-06-07 ENCOUNTER — Telehealth: Payer: Self-pay

## 2021-06-07 ENCOUNTER — Other Ambulatory Visit: Payer: Self-pay

## 2021-06-07 DIAGNOSIS — Z789 Other specified health status: Secondary | ICD-10-CM

## 2021-06-07 DIAGNOSIS — B20 Human immunodeficiency virus [HIV] disease: Secondary | ICD-10-CM

## 2021-06-07 MED ORDER — ESTRADIOL VALERATE 20 MG/ML IM OIL: TOPICAL_OIL | INTRAMUSCULAR | 0 refills | Status: DC

## 2021-06-07 MED ORDER — BIKTARVY 50-200-25 MG PO TABS: 1.0000 | ORAL_TABLET | Freq: Every day | ORAL | 0 refills | Status: DC

## 2021-06-07 MED ORDER — DOXYCYCLINE HYCLATE 100 MG PO TABS: ORAL_TABLET | ORAL | 0 refills | Status: DC

## 2021-06-07 MED ORDER — SPIRONOLACTONE 100 MG PO TABS: ORAL_TABLET | ORAL | 0 refills | Status: DC
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
## 2021-06-15 ENCOUNTER — Telehealth: Payer: Self-pay

## 2021-06-15 NOTE — Telephone Encounter (Signed)
[facility] called and I gave verbal to fill 18G needles.

## 2021-06-19 ENCOUNTER — Ambulatory Visit (INDEPENDENT_AMBULATORY_CARE_PROVIDER_SITE_OTHER): Payer: Self-pay | Admitting: Infectious Disease

## 2021-06-19 ENCOUNTER — Other Ambulatory Visit: Payer: Self-pay

## 2021-06-19 ENCOUNTER — Encounter: Payer: Self-pay | Admitting: Infectious Disease

## 2021-06-19 VITALS — BP 120/78 | HR 69 | Temp 98.3°F | Wt 202.0 lb

## 2021-06-19 DIAGNOSIS — F17210 Nicotine dependence, cigarettes, uncomplicated: Secondary | ICD-10-CM

## 2021-06-19 DIAGNOSIS — Z789 Other specified health status: Secondary | ICD-10-CM

## 2021-06-19 DIAGNOSIS — A749 Chlamydial infection, unspecified: Secondary | ICD-10-CM

## 2021-06-19 DIAGNOSIS — B20 Human immunodeficiency virus [HIV] disease: Secondary | ICD-10-CM

## 2021-06-19 DIAGNOSIS — A549 Gonococcal infection, unspecified: Secondary | ICD-10-CM

## 2021-06-19 DIAGNOSIS — Z599 Problem related to housing and economic circumstances, unspecified: Secondary | ICD-10-CM

## 2021-06-19 DIAGNOSIS — R7989 Other specified abnormal findings of blood chemistry: Secondary | ICD-10-CM

## 2021-06-19 DIAGNOSIS — R21 Rash and other nonspecific skin eruption: Secondary | ICD-10-CM

## 2021-06-19 DIAGNOSIS — A539 Syphilis, unspecified: Secondary | ICD-10-CM

## 2021-06-19 MED ORDER — PENICILLIN G BENZATHINE 1200000 UNIT/2ML IM SUSY
1.2000 10*6.[IU] | PREFILLED_SYRINGE | Freq: Once | INTRAMUSCULAR | Status: AC
  Administered 2021-06-19: 1.2 10*6.[IU] via INTRAMUSCULAR

## 2021-06-19 MED ORDER — SPIRONOLACTONE 50 MG PO TABS: 50.0000 mg | ORAL_TABLET | Freq: Every day | ORAL | 4 refills | Status: DC

## 2021-06-19 NOTE — Patient Instructions (Signed)
Lab appt next week after being on 150mg  dose (100 + 50 mg spironolactone)
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
## 2021-06-20 ENCOUNTER — Ambulatory Visit: Payer: Self-pay | Admitting: Infectious Disease

## 2021-06-20 LAB — URINE CYTOLOGY ANCILLARY ONLY
Chlamydia: NEGATIVE
Comment: NEGATIVE
Comment: NORMAL
Neisseria Gonorrhea: NEGATIVE

## 2021-06-20 LAB — CYTOLOGY, (ORAL, ANAL, URETHRAL) ANCILLARY ONLY
Chlamydia: NEGATIVE
Chlamydia: NEGATIVE
Comment: NEGATIVE
Comment: NEGATIVE
Comment: NORMAL
Comment: NORMAL
Neisseria Gonorrhea: NEGATIVE
Neisseria Gonorrhea: NEGATIVE

## 2021-06-20 LAB — T-HELPER CELL (CD4) - (RCID CLINIC ONLY)
CD4 % Helper T Cell: 36 % (ref 33–65)
CD4 T Cell Abs: 529 /uL (ref 400–1790)

## 2021-06-21 LAB — CBC WITH DIFFERENTIAL/PLATELET
Absolute Monocytes: 316 cells/uL (ref 200–950)
Basophils Absolute: 40 cells/uL (ref 0–200)
Basophils Relative: 1 %
Eosinophils Absolute: 40 cells/uL (ref 15–500)
Eosinophils Relative: 1 %
HCT: 36.8 % — ABNORMAL LOW (ref 38.5–50.0)
Hemoglobin: 12.3 g/dL — ABNORMAL LOW (ref 13.2–17.1)
Lymphs Abs: 1604 cells/uL (ref 850–3900)
MCH: 32.9 pg (ref 27.0–33.0)
MCHC: 33.4 g/dL (ref 32.0–36.0)
MCV: 98.4 fL (ref 80.0–100.0)
MPV: 9.6 fL (ref 7.5–12.5)
Monocytes Relative: 7.9 %
Neutro Abs: 2000 cells/uL (ref 1500–7800)
Neutrophils Relative %: 50 %
Platelets: 317 10*3/uL (ref 140–400)
RBC: 3.74 10*6/uL — ABNORMAL LOW (ref 4.20–5.80)
RDW: 11.6 % (ref 11.0–15.0)
Total Lymphocyte: 40.1 %
WBC: 4 10*3/uL (ref 3.8–10.8)

## 2021-06-21 LAB — COMPLETE METABOLIC PANEL WITH GFR
AG Ratio: 1.8 (calc) (ref 1.0–2.5)
ALT: 24 U/L (ref 9–46)
AST: 20 U/L (ref 10–40)
Albumin: 4.3 g/dL (ref 3.6–5.1)
Alkaline phosphatase (APISO): 32 U/L — ABNORMAL LOW (ref 36–130)
BUN: 14 mg/dL (ref 7–25)
CO2: 27 mmol/L (ref 20–32)
Calcium: 9 mg/dL (ref 8.6–10.3)
Chloride: 107 mmol/L (ref 98–110)
Creat: 0.85 mg/dL (ref 0.60–1.24)
Globulin: 2.4 g/dL (calc) (ref 1.9–3.7)
Glucose, Bld: 76 mg/dL (ref 65–99)
Potassium: 4.3 mmol/L (ref 3.5–5.3)
Sodium: 138 mmol/L (ref 135–146)
Total Bilirubin: 0.3 mg/dL (ref 0.2–1.2)
Total Protein: 6.7 g/dL (ref 6.1–8.1)
eGFR: 123 mL/min/{1.73_m2} (ref >=60)

## 2021-06-21 LAB — TESTOSTERONE: Testosterone: 145 ng/dL — ABNORMAL LOW (ref 250–827)

## 2021-06-21 LAB — HIV-1 RNA QUANT-NO REFLEX-BLD
HIV 1 RNA Quant: 26 Copies/mL — ABNORMAL HIGH
HIV-1 RNA Quant, Log: 1.42 Log cps/mL — ABNORMAL HIGH

## 2021-06-21 LAB — ESTRADIOL: Estradiol: 33 pg/mL (ref <=39)

## 2021-06-21 LAB — RPR: RPR Ser Ql: NONREACTIVE

## 2021-06-25 ENCOUNTER — Telehealth: Payer: Self-pay

## 2021-06-25 NOTE — Telephone Encounter (Signed)
Patient called requesting provider complete form that will allow her to change her gender on her driver's license from male to male. Will route to provider.   Beryle Flock, RN

## 2021-06-27 ENCOUNTER — Other Ambulatory Visit: Payer: Self-pay

## 2021-06-28 ENCOUNTER — Other Ambulatory Visit: Payer: Self-pay

## 2021-07-04 ENCOUNTER — Other Ambulatory Visit: Payer: Self-pay

## 2021-07-10 ENCOUNTER — Other Ambulatory Visit: Payer: Self-pay

## 2021-07-10 DIAGNOSIS — Z789 Other specified health status: Secondary | ICD-10-CM

## 2021-07-10 NOTE — Telephone Encounter (Signed)
Called patient to inform her form has been completed by MD. Will need to complete her portion of form before taking it to [facility]. Patient will pick up form after labs today. Will leave in accordion folder at front desk. Leatrice Jewels, RMA

## 2021-07-11 LAB — BASIC METABOLIC PANEL
BUN: 11 mg/dL (ref 7–25)
CO2: 27 mmol/L (ref 20–32)
Calcium: 8.8 mg/dL (ref 8.6–10.3)
Chloride: 104 mmol/L (ref 98–110)
Creat: 0.95 mg/dL (ref 0.60–1.24)
Glucose, Bld: 89 mg/dL (ref 65–99)
Potassium: 4.6 mmol/L (ref 3.5–5.3)
Sodium: 135 mmol/L (ref 135–146)

## 2021-07-17 ENCOUNTER — Other Ambulatory Visit: Payer: Self-pay

## 2021-07-17 DIAGNOSIS — Z789 Other specified health status: Secondary | ICD-10-CM

## 2021-07-17 MED ORDER — SPIRONOLACTONE 100 MG PO TABS: ORAL_TABLET | ORAL | 3 refills | Status: DC

## 2021-07-19 ENCOUNTER — Other Ambulatory Visit: Payer: Self-pay

## 2021-07-19 DIAGNOSIS — Z789 Other specified health status: Secondary | ICD-10-CM

## 2021-07-19 MED ORDER — ESTRADIOL VALERATE 20 MG/ML IM OIL: TOPICAL_OIL | INTRAMUSCULAR | 0 refills | Status: DC

## 2021-08-20 ENCOUNTER — Other Ambulatory Visit: Payer: Self-pay

## 2021-08-20 DIAGNOSIS — Z789 Other specified health status: Secondary | ICD-10-CM

## 2021-08-20 MED ORDER — ESTRADIOL VALERATE 20 MG/ML IM OIL: TOPICAL_OIL | INTRAMUSCULAR | 2 refills | Status: DC

## 2021-10-17 ENCOUNTER — Other Ambulatory Visit: Payer: Self-pay

## 2021-10-17 DIAGNOSIS — B20 Human immunodeficiency virus [HIV] disease: Secondary | ICD-10-CM

## 2021-10-17 DIAGNOSIS — Z113 Encounter for screening for infections with a predominantly sexual mode of transmission: Secondary | ICD-10-CM

## 2021-10-17 DIAGNOSIS — Z79899 Other long term (current) drug therapy: Secondary | ICD-10-CM

## 2021-10-22 ENCOUNTER — Other Ambulatory Visit: Payer: Self-pay

## 2021-11-12 ENCOUNTER — Encounter: Payer: Self-pay | Admitting: Infectious Disease

## 2021-11-28 ENCOUNTER — Other Ambulatory Visit: Payer: Self-pay

## 2021-11-28 ENCOUNTER — Ambulatory Visit: Payer: Self-pay

## 2021-12-04 ENCOUNTER — Other Ambulatory Visit: Payer: Self-pay

## 2021-12-04 DIAGNOSIS — B20 Human immunodeficiency virus [HIV] disease: Secondary | ICD-10-CM

## 2021-12-04 MED ORDER — BIKTARVY 50-200-25 MG PO TABS: 1.0000 | ORAL_TABLET | Freq: Every day | ORAL | 0 refills | Status: DC

## 2021-12-04 NOTE — Telephone Encounter (Signed)
Received voicemail from [employer] requesting new [facility] Rx be sent to [facility] in [place].   Per Development worker, community, ADAP application was sent on 12/21.   Beryle Flock, RN

## 2021-12-26 ENCOUNTER — Ambulatory Visit: Payer: Self-pay | Admitting: Infectious Disease

## 2021-12-26 ENCOUNTER — Ambulatory Visit: Payer: Self-pay | Admitting: Infectious Diseases

## 2021-12-27 ENCOUNTER — Other Ambulatory Visit: Payer: Self-pay

## 2021-12-27 ENCOUNTER — Ambulatory Visit (INDEPENDENT_AMBULATORY_CARE_PROVIDER_SITE_OTHER): Payer: Self-pay | Admitting: Pharmacist

## 2021-12-27 DIAGNOSIS — Z789 Other specified health status: Secondary | ICD-10-CM

## 2021-12-27 DIAGNOSIS — B20 Human immunodeficiency virus [HIV] disease: Secondary | ICD-10-CM

## 2021-12-27 DIAGNOSIS — Z79899 Other long term (current) drug therapy: Secondary | ICD-10-CM

## 2021-12-27 DIAGNOSIS — Z113 Encounter for screening for infections with a predominantly sexual mode of transmission: Secondary | ICD-10-CM

## 2021-12-27 MED ORDER — DOXYCYCLINE HYCLATE 100 MG PO TABS: ORAL_TABLET | ORAL | 0 refills | Status: DC

## 2021-12-27 MED ORDER — ESTRADIOL VALERATE 20 MG/ML IM OIL: TOPICAL_OIL | INTRAMUSCULAR | 2 refills | Status: DC

## 2021-12-27 MED ORDER — SPIRONOLACTONE 100 MG PO TABS: ORAL_TABLET | ORAL | 3 refills | Status: DC

## 2021-12-27 MED ORDER — SPIRONOLACTONE 50 MG PO TABS: 50.0000 mg | ORAL_TABLET | Freq: Every day | ORAL | 3 refills | Status: DC

## 2021-12-27 MED ORDER — BIKTARVY 50-200-25 MG PO TABS: 1.0000 | ORAL_TABLET | Freq: Every day | ORAL | 5 refills | Status: DC

## 2021-12-27 NOTE — Progress Notes (Signed)
12/27/2021  HPI: Travis Palmer is a 26 y.o. adult who presents to the [facility] clinic for HIV follow-up.  Patient Active Problem List   Diagnosis Date Noted   Depression 01/03/2021   Macrocytic anemia 01/03/2021   Rectal pain 03/07/2020   Calculus of bile duct without cholangitis with obstruction    Elevated LFTs    Common bile duct dilation 02/10/2018   Routine screening for STI (sexually transmitted infection) 01/26/2018   Abdominal pain, epigastric 10/01/2017   GC (gonococcus infection) 05/07/2017   Chlamydia 05/07/2017   Housing problems 08/26/2016   Syphilis 08/02/2015   HIV disease ([place]) 01/09/2015   Transgender 01/09/2015   Cigarette smoker 01/09/2015    Patient's Medications  New Prescriptions   No medications on file  Previous Medications   BD HYPODERMIC NEEDLE 18G X 1" MISC    USE AS DIRECTED.   BICTEGRAVIR-EMTRICITABINE-TENOFOVIR AF (BIKTARVY) 50-200-25 MG TABS TABLET    Take 1 tablet by mouth daily.   DOXYCYCLINE (VIBRA-TABS) 100 MG TABLET    TAKE 1 TABLET BY MOUTH EVERY 12 HOURS FOR 7 DAYS, THEN 2 TABLETS AFTER ANY UNPROTECTED SEX   ESTRADIOL VALERATE (DELESTROGEN) 20 MG/ML INJECTION    ADMINISTER 0.75 ML(15 MG) IN THE MUSCLE 1 TIME A WEEK   SPIRONOLACTONE (ALDACTONE) 100 MG TABLET    TAKE 1 TABLET BY MOUTH EVERY MORNING WITH 50 MG   SPIRONOLACTONE (ALDACTONE) 50 MG TABLET    Take 1 tablet (50 mg total) by mouth daily.   SYRINGE-NEEDLE, DISP, 3 ML (B-D 3CC LUER-LOK SYR 21GX1-1/2) 21G X 1-1/2" 3 ML MISC    Use as directed for delestrogen  Modified Medications   No medications on file  Discontinued Medications   No medications on file    Allergies: Allergies  Allergen Reactions   Apple Itching, Swelling and Other (See Comments)    Mouth swells   Depakote [Divalproex Sodium] Other (See Comments)    Hospitalized for 3 days for extreme GI upset because of this    Past Medical History: Past Medical History:  Diagnosis Date   Depression 01/03/2021    Epigastric pain 10/01/2017   History of bipolar disorder    HIV infection ([place])    Housing problems 08/26/2016   Macrocytic anemia 01/03/2021   Rectal pain 03/07/2020   Routine screening for STI (sexually transmitted infection) 01/26/2018   Syphilis 08/02/2015    Social History: Social History   Socioeconomic History   Marital status: Single    Spouse name: Not on file   Number of children: Not on file   Years of education: Not on file   Highest education level: Not on file  Occupational History   Not on file  Tobacco Use   Smoking status: Former    Packs/day: 0.00    Types: Cigarettes   Smokeless tobacco: Never  Vaping Use   Vaping Use: Never used  Substance and Sexual Activity   Alcohol use: Yes    Alcohol/week: 0.0 standard drinks    Comment: weekend drinker   Drug use: Not Currently    Frequency: 7.0 times per week    Types: Marijuana    Comment: every other day   Sexual activity: Not Currently    Comment: given condoms  Other Topics Concern   Not on file  Social History Narrative   Not on file   Social Determinants of Health   Financial Resource Strain: Not on file  Food Insecurity: Not on file  Transportation Needs: Not  on file  Physical Activity: Not on file  Stress: Not on file  Social Connections: Not on file    Labs: Lab Results  Component Value Date   HIV1RNAQUANT 26 (H) 06/19/2021   HIV1RNAQUANT <20 (H) 01/11/2021   HIV1RNAQUANT <20 12/15/2020   HIV1RNAVL 50 11/06/2015   HIV1RNAVL 175 09/26/2015   HIV1RNAVL <40 07/19/2015   CD4TABS 529 06/19/2021   CD4TABS 847 01/11/2021   CD4TABS 706 07/26/2020    RPR and STI Lab Results  Component Value Date   LABRPR NON-REACTIVE 06/19/2021   LABRPR NON-REACTIVE 01/11/2021   LABRPR NON-REACTIVE 12/15/2020   LABRPR NON-REACTIVE 07/26/2020   LABRPR NON-REACTIVE 02/09/2020   RPRTITER 1:128 (A) 05/24/2015    STI Results GC GC CT CT  Latest Ref Rng & Units - NEGATIVE - NEGATIVE  06/19/2021 Negative -  Negative -  06/19/2021 Negative - Negative -  06/19/2021 Negative - Negative -  01/11/2021 Negative - Negative -  07/26/2020 Negative - Negative -  07/26/2020 Negative - Negative -  07/26/2020 Negative - Negative -  02/09/2020 Negative - Negative -  02/09/2020 Positive(A) - Negative -  02/09/2020 Negative - Negative -  07/28/2019 Negative - Negative -  07/28/2019 **POSITIVE**(A) - **POSITIVE**(A) -  07/28/2019 **POSITIVE**(A) - **POSITIVE**(A) -  01/26/2018 **POSITIVE**(A) - Negative -  01/26/2018 Negative - Negative -  01/26/2018 Negative - Negative -  08/15/2017 Negative - Negative -  08/15/2017 Negative - Negative -  08/15/2017 Negative - Negative -  04/29/2017 Negative - **POSITIVE**(A) -    Hepatitis B Lab Results  Component Value Date   HEPBSAB NEG 12/07/2014   HEPBSAG Negative 02/10/2018   HEPBCAB NON REACTIVE 12/07/2014   Hepatitis C No results found for: HEPCAB, HCVRNAPCRQN Hepatitis A Lab Results  Component Value Date   HAV REACTIVE (A) 12/07/2014   Lipids: Lab Results  Component Value Date   CHOL 131 01/26/2018   TRIG 61 01/26/2018   HDL 56 01/26/2018   CHOLHDL 2.3 01/26/2018   VLDL 18 01/31/2015   LDLCALC 61 01/26/2018    Current HIV Regimen: Biktarvy  Assessment: Travis Palmer is here to follow up for HIV and transgender care. She missed her last few appointments with Dr. Tommy Medal. She states that she is taking her Biktarvy every day and needs a refill, which I will send in today. She has been on and off of her hormone therapy and is asking for other options today. She states that she previously took a suppository from her friend. I am assuming she is referring to estrogen suppositories that are meant to be given vaginally. She used them rectally. I am not sure of the effect that can have rectally but I did not recommend it. She also states that her friend gets birth control shots in [place].   She has an appointment at [facility] transgender clinic in April. I advised her to discuss hormone  therapy with them and she was fine with that. I will refill her medications today.  She desperately wants to do [place] and even asked to start it today. I gave her information regarding the process and told her that her track record with showing up to appointments wasn't good. She said if it was for her medicine, then she would definitely show up. I will discuss with Dr. Tommy Medal. I made a follow up appointment for March with Dr. Tommy Medal to see her before her referral to [facility]. She did not have any signs or symptoms of any STIs today but will  screen her anyway.   Plan: - HIV viral load, CD4, urine/pharyngeal/rectal GC/CT screening, lipid panel, CMET, CBC, RPR today - F/u with Dr. Tommy Medal on 3/1  Jered Heiny L. [facility]ney, PharmD, BCIDP, AAHIVP, CPP Clinical Pharmacist Practitioner Infectious Diseases [place] [facility] 12/27/2021, 11:45 AM

## 2021-12-28 LAB — URINE CYTOLOGY ANCILLARY ONLY
Chlamydia: NEGATIVE
Comment: NEGATIVE
Comment: NORMAL
Neisseria Gonorrhea: NEGATIVE

## 2021-12-28 LAB — CYTOLOGY, (ORAL, ANAL, URETHRAL) ANCILLARY ONLY
Chlamydia: NEGATIVE
Chlamydia: NEGATIVE
Comment: NEGATIVE
Comment: NEGATIVE
Comment: NORMAL
Comment: NORMAL
Neisseria Gonorrhea: NEGATIVE
Neisseria Gonorrhea: NEGATIVE

## 2021-12-28 LAB — T-HELPER CELL (CD4) - (RCID CLINIC ONLY)
CD4 % Helper T Cell: 37 % (ref 33–65)
CD4 T Cell Abs: 745 /uL (ref 400–1790)

## 2021-12-29 LAB — COMPREHENSIVE METABOLIC PANEL
AG Ratio: 1.8 (calc) (ref 1.0–2.5)
ALT: 14 U/L (ref 9–46)
AST: 15 U/L (ref 10–40)
Albumin: 4.4 g/dL (ref 3.6–5.1)
Alkaline phosphatase (APISO): 41 U/L (ref 36–130)
BUN: 12 mg/dL (ref 7–25)
CO2: 28 mmol/L (ref 20–32)
Calcium: 9.1 mg/dL (ref 8.6–10.3)
Chloride: 106 mmol/L (ref 98–110)
Creat: 0.72 mg/dL (ref 0.60–1.24)
Globulin: 2.4 g/dL (calc) (ref 1.9–3.7)
Glucose, Bld: 93 mg/dL (ref 65–99)
Potassium: 4.5 mmol/L (ref 3.5–5.3)
Sodium: 137 mmol/L (ref 135–146)
Total Bilirubin: 0.4 mg/dL (ref 0.2–1.2)
Total Protein: 6.8 g/dL (ref 6.1–8.1)

## 2021-12-29 LAB — CBC WITH DIFFERENTIAL/PLATELET
Absolute Monocytes: 409 cells/uL (ref 200–950)
Basophils Absolute: 48 cells/uL (ref 0–200)
Basophils Relative: 1.1 %
Eosinophils Absolute: 48 cells/uL (ref 15–500)
Eosinophils Relative: 1.1 %
HCT: 38.8 % (ref 38.5–50.0)
Hemoglobin: 13.1 g/dL — ABNORMAL LOW (ref 13.2–17.1)
Lymphs Abs: 2125 cells/uL (ref 850–3900)
MCH: 32.6 pg (ref 27.0–33.0)
MCHC: 33.8 g/dL (ref 32.0–36.0)
MCV: 96.5 fL (ref 80.0–100.0)
MPV: 9.4 fL (ref 7.5–12.5)
Monocytes Relative: 9.3 %
Neutro Abs: 1769 cells/uL (ref 1500–7800)
Neutrophils Relative %: 40.2 %
Platelets: 389 10*3/uL (ref 140–400)
RBC: 4.02 10*6/uL — ABNORMAL LOW (ref 4.20–5.80)
RDW: 13.6 % (ref 11.0–15.0)
Total Lymphocyte: 48.3 %
WBC: 4.4 10*3/uL (ref 3.8–10.8)

## 2021-12-29 LAB — LIPID PANEL
Cholesterol: 127 mg/dL (ref <200)
HDL: 48 mg/dL (ref >=40)
LDL Cholesterol (Calc): 67 mg/dL (calc)
Non-HDL Cholesterol (Calc): 79 mg/dL (calc) (ref <130)
Total CHOL/HDL Ratio: 2.6 (calc) (ref <5.0)
Triglycerides: 50 mg/dL (ref <150)

## 2021-12-29 LAB — HIV-1 RNA QUANT-NO REFLEX-BLD
HIV 1 RNA Quant: 136 Copies/mL — ABNORMAL HIGH
HIV-1 RNA Quant, Log: 2.13 Log cps/mL — ABNORMAL HIGH

## 2021-12-29 LAB — RPR: RPR Ser Ql: NONREACTIVE

## 2021-12-31 ENCOUNTER — Encounter: Payer: Self-pay | Admitting: Pharmacist

## 2022-01-08 ENCOUNTER — Encounter: Payer: Self-pay | Admitting: Infectious Disease

## 2022-02-06 ENCOUNTER — Ambulatory Visit: Payer: Self-pay | Admitting: Infectious Disease

## 2022-05-01 ENCOUNTER — Encounter: Payer: Self-pay | Admitting: Infectious Diseases

## 2022-06-12 ENCOUNTER — Other Ambulatory Visit: Payer: Self-pay

## 2022-06-12 ENCOUNTER — Ambulatory Visit (INDEPENDENT_AMBULATORY_CARE_PROVIDER_SITE_OTHER): Payer: Self-pay | Admitting: Pharmacist

## 2022-06-12 DIAGNOSIS — B20 Human immunodeficiency virus [HIV] disease: Secondary | ICD-10-CM

## 2022-06-12 DIAGNOSIS — Z789 Other specified health status: Secondary | ICD-10-CM

## 2022-06-12 DIAGNOSIS — Z113 Encounter for screening for infections with a predominantly sexual mode of transmission: Secondary | ICD-10-CM

## 2022-06-12 MED ORDER — CABOTEGRAVIR & RILPIVIRINE ER 600 & 900 MG/3ML IM SUER: 1.0000 | INTRAMUSCULAR | 5 refills | Status: DC

## 2022-06-12 MED ORDER — SPIRONOLACTONE 50 MG PO TABS: 50.0000 mg | ORAL_TABLET | Freq: Every day | ORAL | 3 refills | Status: DC

## 2022-06-12 MED ORDER — DOXYCYCLINE HYCLATE 100 MG PO TABS: ORAL_TABLET | ORAL | 0 refills | Status: DC

## 2022-06-12 MED ORDER — ESTRADIOL VALERATE 20 MG/ML IM OIL: TOPICAL_OIL | INTRAMUSCULAR | 2 refills | Status: DC

## 2022-06-12 MED ORDER — SPIRONOLACTONE 100 MG PO TABS: ORAL_TABLET | ORAL | 3 refills | Status: DC

## 2022-06-12 MED ORDER — CABOTEGRAVIR & RILPIVIRINE ER 600 & 900 MG/3ML IM SUER: 1.0000 | INTRAMUSCULAR | 1 refills | Status: DC

## 2022-06-12 NOTE — Progress Notes (Signed)
06/12/2022  HPI: Travis Palmer is a 26 y.o. adult who presents to the [facility] clinic for HIV follow-up.  Patient Active Problem List   Diagnosis Date Noted   Depression 01/03/2021   Macrocytic anemia 01/03/2021   Rectal pain 03/07/2020   Calculus of bile duct without cholangitis with obstruction    Elevated LFTs    Common bile duct dilation 02/10/2018   Routine screening for STI (sexually transmitted infection) 01/26/2018   Abdominal pain, epigastric 10/01/2017   GC (gonococcus infection) 05/07/2017   Chlamydia 05/07/2017   Housing problems 08/26/2016   Syphilis 08/02/2015   HIV disease ([place]) 01/09/2015   Transgender 01/09/2015   Cigarette smoker 01/09/2015    Patient's Medications  New Prescriptions   CABOTEGRAVIR & RILPIVIRINE ER (CABENUVA) 600 & 900 MG/3ML INJECTION    Inject 1 kit into the muscle every 30 (thirty) days.   CABOTEGRAVIR & RILPIVIRINE ER (CABENUVA) 600 & 900 MG/3ML INJECTION    Inject 1 kit into the muscle every 2 (two) months.  Previous Medications   BD HYPODERMIC NEEDLE 18G X 1" MISC    USE AS DIRECTED.   BICTEGRAVIR-EMTRICITABINE-TENOFOVIR AF (BIKTARVY) 50-200-25 MG TABS TABLET    Take 1 tablet by mouth daily.   SYRINGE-NEEDLE, DISP, 3 ML (B-D 3CC LUER-LOK SYR 21GX1-1/2) 21G X 1-1/2" 3 ML MISC    Use as directed for delestrogen  Modified Medications   Modified Medication Previous Medication   DOXYCYCLINE (VIBRA-TABS) 100 MG TABLET doxycycline (VIBRA-TABS) 100 MG tablet      Take 2 tablets after unprotected sex    Take 2 tablets after unprotected sex   ESTRADIOL VALERATE (DELESTROGEN) 20 MG/ML INJECTION estradiol valerate (DELESTROGEN) 20 MG/ML injection      ADMINISTER 0.75 ML(15 MG) IN THE MUSCLE 1 TIME A WEEK    ADMINISTER 0.75 ML(15 MG) IN THE MUSCLE 1 TIME A WEEK   SPIRONOLACTONE (ALDACTONE) 100 MG TABLET spironolactone (ALDACTONE) 100 MG tablet      TAKE 1 TABLET BY MOUTH EVERY MORNING WITH 50 MG    TAKE 1 TABLET BY MOUTH EVERY MORNING WITH 50  MG   SPIRONOLACTONE (ALDACTONE) 50 MG TABLET spironolactone (ALDACTONE) 50 MG tablet      Take 1 tablet (50 mg total) by mouth daily. Take with one 100 mg tablet.    Take 1 tablet (50 mg total) by mouth daily. Take with one 100 mg tablet.  Discontinued Medications   No medications on file    Allergies: Allergies  Allergen Reactions   Apple Juice Itching, Swelling and Other (See Comments)    Mouth swells   Depakote [Divalproex Sodium] Other (See Comments)    Hospitalized for 3 days for extreme GI upset because of this    Past Medical History: Past Medical History:  Diagnosis Date   Depression 01/03/2021   Epigastric pain 10/01/2017   History of bipolar disorder    HIV infection ([place])    Housing problems 08/26/2016   Macrocytic anemia 01/03/2021   Rectal pain 03/07/2020   Routine screening for STI (sexually transmitted infection) 01/26/2018   Syphilis 08/02/2015    Social History: Social History   Socioeconomic History   Marital status: Single    Spouse name: Not on file   Number of children: Not on file   Years of education: Not on file   Highest education level: Not on file  Occupational History   Not on file  Tobacco Use   Smoking status: Former    Packs/day: 0.00  Types: Cigarettes   Smokeless tobacco: Never  Vaping Use   Vaping Use: Never used  Substance and Sexual Activity   Alcohol use: Yes    Alcohol/week: 0.0 standard drinks of alcohol    Comment: weekend drinker   Drug use: Not Currently    Frequency: 7.0 times per week    Types: Marijuana    Comment: every other day   Sexual activity: Not Currently    Comment: given condoms  Other Topics Concern   Not on file  Social History Narrative   Not on file   Social Determinants of Health   Financial Resource Strain: Not on file  Food Insecurity: Not on file  Transportation Needs: Not on file  Physical Activity: Not on file  Stress: Not on file  Social Connections: Not on file    Labs: Lab Results   Component Value Date   HIV1RNAQUANT 136 (H) 12/27/2021   HIV1RNAQUANT 26 (H) 06/19/2021   HIV1RNAQUANT <20 (H) 01/11/2021   HIV1RNAVL 50 11/06/2015   HIV1RNAVL 175 09/26/2015   HIV1RNAVL <40 07/19/2015   CD4TABS 745 12/27/2021   CD4TABS 529 06/19/2021   CD4TABS 847 01/11/2021    RPR and STI Lab Results  Component Value Date   LABRPR NON-REACTIVE 12/27/2021   LABRPR NON-REACTIVE 06/19/2021   LABRPR NON-REACTIVE 01/11/2021   LABRPR NON-REACTIVE 12/15/2020   LABRPR NON-REACTIVE 07/26/2020   RPRTITER 1:128 (A) 05/24/2015    STI Results GC GC CT CT  Latest Ref Rng & Units  NEGATIVE  NEGATIVE  12/27/2021 11:58 AM Negative    Negative    Negative   Negative    Negative    Negative    06/19/2021 10:37 AM Negative    Negative    Negative   Negative    Negative    Negative    01/11/2021 12:03 PM Negative   Negative    07/26/2020  3:43 PM Negative    Negative    Negative   Negative    Negative    Negative    02/09/2020  2:41 PM Negative    Positive    Negative   Negative    Negative    Negative    07/28/2019 12:00 AM **POSITIVE**    **POSITIVE**    Negative   **POSITIVE**    **POSITIVE**    Negative    01/26/2018 12:00 AM Negative    Negative    **POSITIVE**  C  Negative    Negative    Negative  C   08/15/2017 12:00 AM Negative    Negative    Negative   Negative    Negative    Negative    04/29/2017 12:00 AM **POSITIVE**    Negative   Negative    **POSITIVE**    12/07/2014 12:00 AM NG: Negative   CT: Negative    12/20/2013  1:00 PM  NEGATIVE   NEGATIVE     C Corrected result   Multiple values from one day are sorted in reverse-chronological order    Hepatitis B Lab Results  Component Value Date   HEPBSAB NEG 12/07/2014   HEPBSAG Negative 02/10/2018   HEPBCAB NON REACTIVE 12/07/2014   Hepatitis C No results found for: "HEPCAB", "HCVRNAPCRQN" Hepatitis A Lab Results  Component Value Date   HAV REACTIVE (A) 12/07/2014   Lipids: Lab Results   Component Value Date   CHOL 127 12/27/2021   TRIG 50 12/27/2021   HDL 48 12/27/2021   CHOLHDL 2.6 12/27/2021   VLDL  18 01/31/2015   LDLCALC 67 12/27/2021    Current HIV Regimen: Biktarvy  Assessment: Travis Palmer is here today to follow up for her HIV infection. [name] doing well on Biktarvy and states that she is taking it every day. [name] still interested in [place] and is asking if she can start today. We do not have her injection here from [facility], so I told her that she would have to come back on another day. I tried to get her started back in January, but she never scheduled her first injection appointment. I reminded her of the commitment and need to come to appointments on time and within her window. She continues to state that it will not be a problem.   [name] asking for refills of her hormone therapy. She decided that establishing care at the [facility] transgender clinic is not a good idea right now as she is not working and is homeless. She will follow up with them next year in hopes things are better. I will refill her estradiol and spironolactone today. She will also meet with financial today to renew her Brodee Mauritz.   She has small bumps on her chest that are not visible with the naked eye but she can feel them. She thinks it is due to sun exposure because she has been outside a lot lately tanning but not wearing sunscreen. I did not visibly see any rash, so I doubt it is due to syphilis. I advised her to wear sunscreen as it could be sun poisoning. [name] asking for STI screening today. Will check labs and see her back next week to start [place]. She knows that she has to answer her phone to talk to [facility] before they are able to send her injection to the clinic.   Plan: - HIV viral load, RPR, urine/pharyngeal/rectal cytologies today - Refill estradiol and spironolactone - Send Cabenuva Rx to [facility] - F/u with me on Monday 7/10 for first Cabenuva  injection  Nasri Boakye L. Mykeal Carrick, PharmD, BCIDP, AAHIVP, CPP Clinical Pharmacist Practitioner Infectious Diseases [place] [facility] 06/12/2022, 2:25 PM

## 2022-06-13 LAB — CYTOLOGY, (ORAL, ANAL, URETHRAL) ANCILLARY ONLY
Chlamydia: NEGATIVE
Chlamydia: POSITIVE — AB
Comment: NEGATIVE
Comment: NEGATIVE
Comment: NORMAL
Comment: NORMAL
Neisseria Gonorrhea: NEGATIVE
Neisseria Gonorrhea: NEGATIVE

## 2022-06-13 LAB — URINE CYTOLOGY ANCILLARY ONLY
Chlamydia: NEGATIVE
Comment: NEGATIVE
Comment: NORMAL
Neisseria Gonorrhea: NEGATIVE

## 2022-06-14 ENCOUNTER — Telehealth: Payer: Self-pay

## 2022-06-14 LAB — HIV-1 RNA QUANT-NO REFLEX-BLD
HIV 1 RNA Quant: <20 Copies/mL — ABNORMAL HIGH
HIV-1 RNA Quant, Log: <1.3 Log cps/mL — ABNORMAL HIGH

## 2022-06-14 LAB — RPR: RPR Ser Ql: NONREACTIVE

## 2022-06-14 NOTE — Telephone Encounter (Signed)
[facility] Patient Advocate Encounter  Patient's medication Kern Reap) have been couriered to [facility] from [employer] and will be administered on the patient next office visit on 06/17/22.  [name] , [facility] Patient [facility] for Infectious Disease Phone: [phone] Fax:  [phone]

## 2022-06-14 NOTE — Telephone Encounter (Signed)
Thank you :)

## 2022-06-14 NOTE — Telephone Encounter (Signed)
Received notification that patient's rectal swab positive for chlamydia. Will route to provider.   Beryle Flock, RN

## 2022-06-17 ENCOUNTER — Other Ambulatory Visit: Payer: Self-pay

## 2022-06-17 ENCOUNTER — Ambulatory Visit (INDEPENDENT_AMBULATORY_CARE_PROVIDER_SITE_OTHER): Payer: Self-pay | Admitting: Pharmacist

## 2022-06-17 DIAGNOSIS — Z79890 Hormone replacement therapy (postmenopausal): Secondary | ICD-10-CM

## 2022-06-17 DIAGNOSIS — B20 Human immunodeficiency virus [HIV] disease: Secondary | ICD-10-CM

## 2022-06-17 DIAGNOSIS — A749 Chlamydial infection, unspecified: Secondary | ICD-10-CM

## 2022-06-17 MED ORDER — "BD HYPODERMIC NEEDLE 18G X 1"" MISC": 0 refills | Status: DC

## 2022-06-17 MED ORDER — CABOTEGRAVIR & RILPIVIRINE ER 600 & 900 MG/3ML IM SUER
1.0000 | Freq: Once | INTRAMUSCULAR | Status: AC
  Administered 2022-06-17: 1 via INTRAMUSCULAR

## 2022-06-17 MED ORDER — AZITHROMYCIN 250 MG PO TABS
1000.0000 mg | ORAL_TABLET | Freq: Once | ORAL | Status: AC
  Administered 2022-06-17: 1000 mg via ORAL

## 2022-06-17 MED ORDER — "BD LUER-LOK SYRINGE 21G X 1-1/2"" 3 ML MISC": 4 refills | Status: DC

## 2022-06-17 NOTE — Progress Notes (Signed)
HPI: Travis Palmer is a 26 y.o. adult who presents to the [facility] clinic for [place] administration.  Patient Active Problem List   Diagnosis Date Noted   Depression 01/03/2021   Macrocytic anemia 01/03/2021   Rectal pain 03/07/2020   Calculus of bile duct without cholangitis with obstruction    Elevated LFTs    Common bile duct dilation 02/10/2018   Routine screening for STI (sexually transmitted infection) 01/26/2018   Abdominal pain, epigastric 10/01/2017   GC (gonococcus infection) 05/07/2017   Chlamydia 05/07/2017   Housing problems 08/26/2016   Syphilis 08/02/2015   HIV disease ([place]) 01/09/2015   Transgender 01/09/2015   Cigarette smoker 01/09/2015    Patient's Medications  New Prescriptions   No medications on file  Previous Medications   BICTEGRAVIR-EMTRICITABINE-TENOFOVIR AF (BIKTARVY) 50-200-25 MG TABS TABLET    Take 1 tablet by mouth daily.   CABOTEGRAVIR & RILPIVIRINE ER (CABENUVA) 600 & 900 MG/3ML INJECTION    Inject 1 kit into the muscle every 30 (thirty) days.   CABOTEGRAVIR & RILPIVIRINE ER (CABENUVA) 600 & 900 MG/3ML INJECTION    Inject 1 kit into the muscle every 2 (two) months.   DOXYCYCLINE (VIBRA-TABS) 100 MG TABLET    Take 2 tablets after unprotected sex   ESTRADIOL VALERATE (DELESTROGEN) 20 MG/ML INJECTION    ADMINISTER 0.75 ML(15 MG) IN THE MUSCLE 1 TIME A WEEK   SPIRONOLACTONE (ALDACTONE) 100 MG TABLET    TAKE 1 TABLET BY MOUTH EVERY MORNING WITH 50 MG   SPIRONOLACTONE (ALDACTONE) 50 MG TABLET    Take 1 tablet (50 mg total) by mouth daily. Take with one 100 mg tablet.  Modified Medications   Modified Medication Previous Medication   NEEDLE, DISP, 18 G (BD HYPODERMIC NEEDLE) 18G X 1" MISC BD HYPODERMIC NEEDLE 18G X 1" MISC      USE AS DIRECTED.    USE AS DIRECTED.   SYRINGE-NEEDLE, DISP, 3 ML (B-D 3CC LUER-LOK SYR 21GX1-1/2) 21G X 1-1/2" 3 ML MISC SYRINGE-NEEDLE, DISP, 3 ML (B-D 3CC LUER-LOK SYR 21GX1-1/2) 21G X 1-1/2" 3 ML MISC      Use as  directed for delestrogen    Use as directed for delestrogen  Discontinued Medications   No medications on file    Allergies: Allergies  Allergen Reactions   Apple Juice Itching, Swelling and Other (See Comments)    Mouth swells   Depakote [Divalproex Sodium] Other (See Comments)    Hospitalized for 3 days for extreme GI upset because of this    Past Medical History: Past Medical History:  Diagnosis Date   Depression 01/03/2021   Epigastric pain 10/01/2017   History of bipolar disorder    HIV infection ([place])    Housing problems 08/26/2016   Macrocytic anemia 01/03/2021   Rectal pain 03/07/2020   Routine screening for STI (sexually transmitted infection) 01/26/2018   Syphilis 08/02/2015    Social History: Social History   Socioeconomic History   Marital status: Single    Spouse name: Not on file   Number of children: Not on file   Years of education: Not on file   Highest education level: Not on file  Occupational History   Not on file  Tobacco Use   Smoking status: Former    Packs/day: 0.00    Types: Cigarettes   Smokeless tobacco: Never  Vaping Use   Vaping Use: Never used  Substance and Sexual Activity   Alcohol use: Yes    Alcohol/week: 0.0 standard  drinks of alcohol    Comment: weekend drinker   Drug use: Not Currently    Frequency: 7.0 times per week    Types: Marijuana    Comment: every other day   Sexual activity: Not Currently    Comment: given condoms  Other Topics Concern   Not on file  Social History Narrative   Not on file   Social Determinants of Health   Financial Resource Strain: Not on file  Food Insecurity: Not on file  Transportation Needs: Not on file  Physical Activity: Not on file  Stress: Not on file  Social Connections: Not on file    Labs: Lab Results  Component Value Date   HIV1RNAQUANT <20 (H) 06/12/2022   HIV1RNAQUANT 136 (H) 12/27/2021   HIV1RNAQUANT 26 (H) 06/19/2021   HIV1RNAVL 50 11/06/2015   HIV1RNAVL 175  09/26/2015   HIV1RNAVL <40 07/19/2015   CD4TABS 745 12/27/2021   CD4TABS 529 06/19/2021   CD4TABS 847 01/11/2021    RPR and STI Lab Results  Component Value Date   LABRPR NON-REACTIVE 06/12/2022   LABRPR NON-REACTIVE 12/27/2021   LABRPR NON-REACTIVE 06/19/2021   LABRPR NON-REACTIVE 01/11/2021   LABRPR NON-REACTIVE 12/15/2020   RPRTITER 1:128 (A) 05/24/2015    STI Results GC GC CT CT  Latest Ref Rng & Units  NEGATIVE  NEGATIVE  06/12/2022  2:19 PM Negative    Negative    Negative   Positive    Negative    Negative    12/27/2021 11:58 AM Negative    Negative    Negative   Negative    Negative    Negative    06/19/2021 10:37 AM Negative    Negative    Negative   Negative    Negative    Negative    01/11/2021 12:03 PM Negative   Negative    07/26/2020  3:43 PM Negative    Negative    Negative   Negative    Negative    Negative    02/09/2020  2:41 PM Negative    Positive    Negative   Negative    Negative    Negative    07/28/2019 12:00 AM **POSITIVE**    **POSITIVE**    Negative   **POSITIVE**    **POSITIVE**    Negative    01/26/2018 12:00 AM Negative    Negative    **POSITIVE**  C  Negative    Negative    Negative  C   08/15/2017 12:00 AM Negative    Negative    Negative   Negative    Negative    Negative    04/29/2017 12:00 AM **POSITIVE**    Negative   Negative    **POSITIVE**    12/07/2014 12:00 AM NG: Negative   CT: Negative    12/20/2013  1:00 PM  NEGATIVE   NEGATIVE     C Corrected result   Multiple values from one day are sorted in reverse-chronological order    Hepatitis B Lab Results  Component Value Date   HEPBSAB NEG 12/07/2014   HEPBSAG Negative 02/10/2018   HEPBCAB NON REACTIVE 12/07/2014   Hepatitis C No results found for: "HEPCAB", "HCVRNAPCRQN" Hepatitis A Lab Results  Component Value Date   HAV REACTIVE (A) 12/07/2014   Lipids: Lab Results  Component Value Date   CHOL 127 12/27/2021   TRIG 50 12/27/2021   HDL 48  12/27/2021   CHOLHDL 2.6 12/27/2021   VLDL 18 01/31/2015   LDLCALC  67 12/27/2021    Current HIV Regimen: Biktarvy  TARGET DATE: The 10th  Assessment: [name] presents today for her first initiation injection for Cabenuva. Counseled that [place] is two separate intramuscular injections in the gluteal muscle on each side for each visit. Explained that the second injection is 30 days after the initial injection then every 2 months thereafter. Discussed the rare but significant chance of developing resistance despite compliance. Explained that showing up to injection appointments is very important and warned that if 2 appointments are missed, it will be reassessed by their provider whether they are a good candidate for injection therapy. Counseled on possible side effects associated with the injections such as injection site pain, which is usually mild to moderate in nature, injection site nodules, and injection site reactions. Asked to call the clinic or send me a mychart message if they experience any issues, such as fatigue, nausea, headache, rash, or dizziness. Advised that they can take ibuprofen or tylenol for injection site pain if needed.   Administered cabotegravir 659m/3mL in left upper outer quadrant of the gluteal muscle. Administered rilpivirine 900 mg/345min the right upper outer quadrant of the gluteal muscle. Monitored patient for 10 minutes after injection. Injections were tolerated well without issue. Counseled to stop taking Biktarvy after today's dose and to call with any issues that may arise. Will make follow up appointments for second initiation injection in 30 days.  She brought in her estradiol injection and wanted me to show her how to inject. She did not have any needles with her and asked if we had needles here. We do not have correct needles for injection, so I was unable to show her. Sent needles to [facility] again and asked her to go pick them up. Offered for her to come  back and I can show her how to inject if she needs.   Her rectal screen was positive for chlamydia last week. She was not sure when she could get to the pharmacy so I had her take azithromycin PO 1 gm x 1 today in clinic. She will follow up with Dr. VaTommy Medaln August for her 2nd injection.  Plan: - Stop Biktarvy after today's dose - First Cabenuva injections administered - Second initiation injection scheduled for 8/9 with Dr. VaTommy Medal Azithromycin 1 gm PO x 1 for rectal chlamydia - Call with any issues or questions  Desira Alessandrini L. Arling Cerone, PharmD, BCIDP, AAHIVP, CPWaynesborolinical Pharmacist Practitioner [facility]

## 2022-07-10 ENCOUNTER — Telehealth: Payer: Self-pay | Admitting: Pharmacist

## 2022-07-10 NOTE — Telephone Encounter (Signed)
Patient's specialty medication Kern Reap) was delivered from [facility] and will be administered at next office visit on 8/9.  Alfonse Spruce, PharmD, CPP, [place] Clinical Pharmacist Practitioner [facility]

## 2022-07-17 ENCOUNTER — Encounter: Payer: Self-pay | Admitting: Infectious Disease

## 2022-07-17 ENCOUNTER — Other Ambulatory Visit: Payer: Self-pay

## 2022-07-17 ENCOUNTER — Ambulatory Visit (INDEPENDENT_AMBULATORY_CARE_PROVIDER_SITE_OTHER): Payer: Self-pay | Admitting: Infectious Disease

## 2022-07-17 VITALS — BP 127/75 | HR 60 | Temp 98.5°F | Ht 68.0 in | Wt 208.0 lb

## 2022-07-17 DIAGNOSIS — A539 Syphilis, unspecified: Secondary | ICD-10-CM

## 2022-07-17 DIAGNOSIS — A749 Chlamydial infection, unspecified: Secondary | ICD-10-CM

## 2022-07-17 DIAGNOSIS — Z789 Other specified health status: Secondary | ICD-10-CM

## 2022-07-17 DIAGNOSIS — B20 Human immunodeficiency virus [HIV] disease: Secondary | ICD-10-CM

## 2022-07-17 DIAGNOSIS — R21 Rash and other nonspecific skin eruption: Secondary | ICD-10-CM

## 2022-07-17 HISTORY — DX: Rash and other nonspecific skin eruption: R21

## 2022-07-17 MED ORDER — CABOTEGRAVIR & RILPIVIRINE ER 600 & 900 MG/3ML IM SUER
1.0000 | Freq: Once | INTRAMUSCULAR | Status: AC
  Administered 2022-07-17: 1 via INTRAMUSCULAR

## 2022-07-17 MED ORDER — DOXYCYCLINE HYCLATE 100 MG PO TABS: ORAL_TABLET | ORAL | 5 refills | Status: DC

## 2022-07-17 NOTE — Progress Notes (Signed)
Subjective:  Chief complaint faint rash that she thinks is due to her chlamydia infection  Patient ID: Travis Palmer, adult    DOB: Feb 06, 1996, 26 y.o.   MRN: [id number]  HPI  Travis Palmer is a 26 year old transgender woman who has HIV infection and is currently on [place] set to receive her second dose today.  She had tested positive for rectal chlamydia at last visit was given a dose of azithromycin.  She does not appear to have been taking her doxycycline postexposure prophylaxis but we went over that again today she has a rash that she shows me on her chest that seems fairly faint and she is concerned this is due to her chlamydia.    Past Medical History:  Diagnosis Date   Depression 01/03/2021   Epigastric pain 10/01/2017   History of bipolar disorder    HIV infection ([place])    Housing problems 08/26/2016   Macrocytic anemia 01/03/2021   Rectal pain 03/07/2020   Routine screening for STI (sexually transmitted infection) 01/26/2018   Syphilis 08/02/2015    Past Surgical History:  Procedure Laterality Date   ENDOSCOPIC RETROGRADE CHOLANGIOPANCREATOGRAPHY (ERCP) WITH PROPOFOL N/A 02/12/2018   Procedure: ENDOSCOPIC RETROGRADE CHOLANGIOPANCREATOGRAPHY (ERCP) WITH PROPOFOL;  Surgeon: Ladene Artist, MD;  Location: [facility];  Service: Endoscopy;  Laterality: N/A;   WISDOM TOOTH EXTRACTION  2014    Family History  Problem Relation Age of Onset   Lactose intolerance Mother    Cancer Maternal Aunt    Hypertension Maternal Grandmother       Social History   Socioeconomic History   Marital status: Single    Spouse name: Not on file   Number of children: Not on file   Years of education: Not on file   Highest education level: Not on file  Occupational History   Not on file  Tobacco Use   Smoking status: Former    Packs/day: 0.00    Types: Cigarettes   Smokeless tobacco: Never  Vaping Use   Vaping Use: Never used  Substance and Sexual Activity   Alcohol use: Yes     Alcohol/week: 0.0 standard drinks of alcohol    Comment: weekend drinker   Drug use: Not Currently    Frequency: 7.0 times per week    Types: Marijuana    Comment: every other day   Sexual activity: Not Currently    Comment: given condoms  Other Topics Concern   Not on file  Social History Narrative   Not on file   Social Determinants of Health   Financial Resource Strain: Not on file  Food Insecurity: Not on file  Transportation Needs: Not on file  Physical Activity: Not on file  Stress: Not on file  Social Connections: Not on file    Allergies  Allergen Reactions   Apple Juice Itching, Swelling and Other (See Comments)    Mouth swells   Depakote [Divalproex Sodium] Other (See Comments)    Hospitalized for 3 days for extreme GI upset because of this     Current Outpatient Medications:    bictegravir-emtricitabine-tenofovir AF (BIKTARVY) 50-200-25 MG TABS tablet, Take 1 tablet by mouth daily., Disp: 30 tablet, Rfl: 5   cabotegravir & rilpivirine ER (CABENUVA) 600 & 900 MG/3ML injection, Inject 1 kit into the muscle every 30 (thirty) days., Disp: 6 mL, Rfl: 1   cabotegravir & rilpivirine ER (CABENUVA) 600 & 900 MG/3ML injection, Inject 1 kit into the muscle every 2 (two) months., Disp: 6  mL, Rfl: 5   doxycycline (VIBRA-TABS) 100 MG tablet, Take 2 tablets after unprotected sex, Disp: 60 tablet, Rfl: 0   estradiol valerate (DELESTROGEN) 20 MG/ML injection, ADMINISTER 0.75 ML(15 MG) IN THE MUSCLE 1 TIME A WEEK, Disp: 5 mL, Rfl: 2   NEEDLE, DISP, 18 G (BD HYPODERMIC NEEDLE) 18G X 1" MISC, USE AS DIRECTED., Disp: 4 each, Rfl: 0   spironolactone (ALDACTONE) 100 MG tablet, TAKE 1 TABLET BY MOUTH EVERY MORNING WITH 50 MG, Disp: 30 tablet, Rfl: 3   spironolactone (ALDACTONE) 50 MG tablet, Take 1 tablet (50 mg total) by mouth daily. Take with one 100 mg tablet., Disp: 30 tablet, Rfl: 3   SYRINGE-NEEDLE, DISP, 3 ML (B-D 3CC LUER-LOK SYR 21GX1-1/2) 21G X 1-1/2" 3 ML MISC, Use as directed  for delestrogen, Disp: 4 each, Rfl: 4   Review of Systems  Constitutional:  Negative for activity change, appetite change, chills, diaphoresis, fatigue, fever and unexpected weight change.  HENT:  Negative for congestion, rhinorrhea, sinus pressure, sneezing, sore throat and trouble swallowing.   Eyes:  Negative for photophobia and visual disturbance.  Respiratory:  Negative for cough, chest tightness, shortness of breath, wheezing and stridor.   Cardiovascular:  Negative for chest pain, palpitations and leg swelling.  Gastrointestinal:  Negative for abdominal distention, abdominal pain, anal bleeding, blood in stool, constipation, diarrhea, nausea and vomiting.  Genitourinary:  Negative for difficulty urinating, dysuria, flank pain and hematuria.  Musculoskeletal:  Negative for arthralgias, back pain, gait problem, joint swelling and myalgias.  Skin:  Positive for rash. Negative for color change, pallor and wound.  Neurological:  Negative for dizziness, tremors, weakness, light-headedness and headaches.  Hematological:  Negative for adenopathy. Does not bruise/bleed easily.  Psychiatric/Behavioral:  Negative for agitation, behavioral problems, confusion, decreased concentration, dysphoric mood, sleep disturbance and suicidal ideas.        Objective:   Physical Exam Constitutional:      Appearance: [name] well-developed.  HENT:     Head: Normocephalic and atraumatic.  Eyes:     Conjunctiva/sclera: Conjunctivae normal.  Cardiovascular:     Rate and Rhythm: Normal rate and regular rhythm.  Pulmonary:     Effort: Pulmonary effort is normal. No respiratory distress.     Breath sounds: No wheezing.  Abdominal:     General: There is no distension.     Palpations: Abdomen is soft.  Musculoskeletal:        General: No tenderness. Normal range of motion.     Cervical back: Normal range of motion and neck supple.  Skin:    General: Skin is warm and dry.     Coloration: Skin is not pale.      Findings: No erythema.  Neurological:     General: No focal deficit present.     Mental Status: [name] alert and oriented to person, place, and time.  Psychiatric:        Mood and Affect: Mood normal.        Behavior: Behavior normal.        Thought Content: Thought content normal.        Judgment: Judgment normal.   Hard to appreciate the area that she is saying is a rash there is some other hyperpigmented areas that are chronic        Assessment & Plan:   HIV disease:  [name] receiving Cabenuva IM injection #2 today and then I have scheduled her in 2 months to get another injection and then  2 months after that.  We also check a viral load today.  History of chlamydia infection: We will check for gonorrhea and chlamydia in the urine as well as oropharynx and rectum.  We also check a syphilis test  History of rash: Her all of her recent RPRs have been seronegative.  Will repeat an RPR now  Transgender identity she wanted to have education on how to administer estradiol and was given instructions by Alfonse Spruce with [facility]

## 2022-07-19 LAB — CYTOLOGY, (ORAL, ANAL, URETHRAL) ANCILLARY ONLY
Chlamydia: NEGATIVE
Chlamydia: NEGATIVE
Comment: NEGATIVE
Comment: NEGATIVE
Comment: NORMAL
Comment: NORMAL
Neisseria Gonorrhea: NEGATIVE
Neisseria Gonorrhea: NEGATIVE

## 2022-07-19 LAB — HIV-1 RNA QUANT-NO REFLEX-BLD
HIV 1 RNA Quant: <20 Copies/mL — ABNORMAL HIGH
HIV-1 RNA Quant, Log: <1.3 Log cps/mL — ABNORMAL HIGH

## 2022-07-19 LAB — RPR: RPR Ser Ql: NONREACTIVE

## 2022-07-19 LAB — URINE CYTOLOGY ANCILLARY ONLY
Chlamydia: NEGATIVE
Comment: NEGATIVE
Comment: NEGATIVE
Comment: NORMAL
Neisseria Gonorrhea: NEGATIVE
Trichomonas: NEGATIVE

## 2022-09-10 ENCOUNTER — Telehealth: Payer: Self-pay

## 2022-09-10 NOTE — Telephone Encounter (Signed)
[facility] Patient Advocate Encounter  Patient's medication Travis Palmer) have been couriered to [facility] from [employer] and will be administered on the patient next office visit on 09/16/22.  [name] , [facility] Patient [facility] for Infectious Disease Phone: [phone] Fax:  [phone]

## 2022-09-16 ENCOUNTER — Ambulatory Visit: Payer: Self-pay | Admitting: Infectious Disease

## 2022-09-23 ENCOUNTER — Ambulatory Visit (INDEPENDENT_AMBULATORY_CARE_PROVIDER_SITE_OTHER): Payer: Self-pay | Admitting: Infectious Disease

## 2022-09-23 ENCOUNTER — Other Ambulatory Visit: Payer: Self-pay

## 2022-09-23 ENCOUNTER — Encounter: Payer: Self-pay | Admitting: Infectious Disease

## 2022-09-23 VITALS — BP 124/76 | HR 67 | Temp 97.6°F

## 2022-09-23 DIAGNOSIS — Z789 Other specified health status: Secondary | ICD-10-CM

## 2022-09-23 DIAGNOSIS — Z113 Encounter for screening for infections with a predominantly sexual mode of transmission: Secondary | ICD-10-CM

## 2022-09-23 DIAGNOSIS — B20 Human immunodeficiency virus [HIV] disease: Secondary | ICD-10-CM

## 2022-09-23 DIAGNOSIS — Z79899 Other long term (current) drug therapy: Secondary | ICD-10-CM

## 2022-09-23 DIAGNOSIS — Z79890 Hormone replacement therapy (postmenopausal): Secondary | ICD-10-CM

## 2022-09-23 DIAGNOSIS — F640 Transsexualism: Secondary | ICD-10-CM

## 2022-09-23 MED ORDER — DOXYCYCLINE HYCLATE 100 MG PO TABS: ORAL_TABLET | ORAL | 5 refills | Status: DC

## 2022-09-23 MED ORDER — CABOTEGRAVIR & RILPIVIRINE ER 600 & 900 MG/3ML IM SUER
1.0000 | Freq: Once | INTRAMUSCULAR | Status: AC
  Administered 2022-09-23: 1 via INTRAMUSCULAR

## 2022-09-23 MED ORDER — SPIRONOLACTONE 50 MG PO TABS: 50.0000 mg | ORAL_TABLET | Freq: Every day | ORAL | 5 refills | Status: DC

## 2022-09-23 MED ORDER — ESTRADIOL VALERATE 20 MG/ML IM OIL: TOPICAL_OIL | INTRAMUSCULAR | 5 refills | Status: DC

## 2022-09-23 MED ORDER — SPIRONOLACTONE 100 MG PO TABS: ORAL_TABLET | ORAL | 5 refills | Status: DC

## 2022-09-23 MED ORDER — "BD HYPODERMIC NEEDLE 18G X 1"" MISC": 0 refills | Status: DC

## 2022-09-23 MED ORDER — "BD LUER-LOK SYRINGE 21G X 1-1/2"" 3 ML MISC": 4 refills | Status: DC

## 2022-09-23 NOTE — Progress Notes (Signed)
Subjective:  Chief complaint : She would like her estradiol and spironolactone refilled again  Patient ID: Travis Palmer, adult    DOB: August 16, 1996, 26 y.o.   MRN: [id number]  HPI  Travis Palmer is a 26 year old transgender woman who has HIV infection and is currently on [place]  She has not been taking spironolactone and estradiol but wants to get back onto these medications.  [name] taking doxycycline 2 tablets after unprotected sex to prevent chlamydia syphilis and potentially gonorrhea.  She did not want STI testing today.    Past Medical History:  Diagnosis Date   Depression 01/03/2021   Epigastric pain 10/01/2017   History of bipolar disorder    HIV infection ([place])    Housing problems 08/26/2016   Macrocytic anemia 01/03/2021   Rash 07/17/2022   Rectal pain 03/07/2020   Routine screening for STI (sexually transmitted infection) 01/26/2018   Syphilis 08/02/2015    Past Surgical History:  Procedure Laterality Date   ENDOSCOPIC RETROGRADE CHOLANGIOPANCREATOGRAPHY (ERCP) WITH PROPOFOL N/A 02/12/2018   Procedure: ENDOSCOPIC RETROGRADE CHOLANGIOPANCREATOGRAPHY (ERCP) WITH PROPOFOL;  Surgeon: Ladene Artist, MD;  [facility];  Service: Endoscopy;  Laterality: N/A;   WISDOM TOOTH EXTRACTION  2014    Family History  Problem Relation Age of Onset   Lactose intolerance Mother    Cancer Maternal Aunt    Hypertension Maternal Grandmother       Social History   Socioeconomic History   Marital status: Single    Spouse name: Not on file   Number of children: Not on file   Years of education: Not on file   Highest education level: Not on file  Occupational History   Not on file  Tobacco Use   Smoking status: Former    Packs/day: 0.00    Types: Cigarettes   Smokeless tobacco: Never  Vaping Use   Vaping Use: Never used  Substance and Sexual Activity   Alcohol use: Yes    Alcohol/week: 0.0 standard drinks of alcohol    Comment: weekend drinker   Drug use: Not Currently     Frequency: 7.0 times per week    Types: Marijuana    Comment: every other day   Sexual activity: Not Currently    Comment: accepted condoms  Other Topics Concern   Not on file  Social History Narrative   Not on file   Social Determinants of Health   Financial Resource Strain: Not on file  Food Insecurity: Not on file  Transportation Needs: Not on file  Physical Activity: Not on file  Stress: Not on file  Social Connections: Not on file    Allergies  Allergen Reactions   Apple Juice Itching, Swelling and Other (See Comments)    Mouth swells   Depakote [Divalproex Sodium] Other (See Comments)    Hospitalized for 3 days for extreme GI upset because of this     Current Outpatient Medications:    cabotegravir & rilpivirine ER (CABENUVA) 600 & 900 MG/3ML injection, Inject 1 kit into the muscle every 30 (thirty) days., Disp: 6 mL, Rfl: 1   cabotegravir & rilpivirine ER (CABENUVA) 600 & 900 MG/3ML injection, Inject 1 kit into the muscle every 2 (two) months., Disp: 6 mL, Rfl: 5   doxycycline (VIBRA-TABS) 100 MG tablet, Take 2 tablets after unprotected sex, Disp: 60 tablet, Rfl: 5   estradiol valerate (DELESTROGEN) 20 MG/ML injection, ADMINISTER 0.75 ML(15 MG) IN THE MUSCLE 1 TIME A WEEK, Disp: 5 mL, Rfl:  2   NEEDLE, DISP, 18 G (BD HYPODERMIC NEEDLE) 18G X 1" MISC, USE AS DIRECTED., Disp: 4 each, Rfl: 0   spironolactone (ALDACTONE) 100 MG tablet, TAKE 1 TABLET BY MOUTH EVERY MORNING WITH 50 MG, Disp: 30 tablet, Rfl: 3   spironolactone (ALDACTONE) 50 MG tablet, Take 1 tablet (50 mg total) by mouth daily. Take with one 100 mg tablet., Disp: 30 tablet, Rfl: 3   SYRINGE-NEEDLE, DISP, 3 ML (B-D 3CC LUER-LOK SYR 21GX1-1/2) 21G X 1-1/2" 3 ML MISC, Use as directed for delestrogen, Disp: 4 each, Rfl: 4   Review of Systems  Constitutional:  Negative for activity change, appetite change, chills, diaphoresis, fatigue, fever and unexpected weight change.  HENT:  Negative for congestion,  rhinorrhea, sinus pressure, sneezing, sore throat and trouble swallowing.   Eyes:  Negative for photophobia and visual disturbance.  Respiratory:  Negative for cough, chest tightness, shortness of breath, wheezing and stridor.   Cardiovascular:  Negative for chest pain, palpitations and leg swelling.  Gastrointestinal:  Negative for abdominal distention, abdominal pain, anal bleeding, blood in stool, constipation, diarrhea, nausea and vomiting.  Genitourinary:  Negative for difficulty urinating, dysuria, flank pain and hematuria.  Musculoskeletal:  Negative for arthralgias, back pain, gait problem, joint swelling and myalgias.  Skin:  Negative for color change, pallor, rash and wound.  Neurological:  Negative for dizziness, tremors, weakness and light-headedness.  Hematological:  Negative for adenopathy. Does not bruise/bleed easily.  Psychiatric/Behavioral:  Negative for agitation, behavioral problems, confusion, decreased concentration, dysphoric mood and sleep disturbance.        Objective:   Physical Exam Vitals reviewed: 2..  Constitutional:      Appearance: [name] well-developed.  HENT:     Head: Normocephalic and atraumatic.  Eyes:     Conjunctiva/sclera: Conjunctivae normal.  Cardiovascular:     Rate and Rhythm: Normal rate and regular rhythm.  Pulmonary:     Effort: Pulmonary effort is normal. No respiratory distress.     Breath sounds: No wheezing.  Abdominal:     General: There is no distension.     Palpations: Abdomen is soft.  Musculoskeletal:        General: No tenderness. Normal range of motion.     Cervical back: Normal range of motion and neck supple.  Skin:    General: Skin is warm and dry.     Coloration: Skin is not pale.     Findings: No erythema or rash.  Neurological:     General: No focal deficit present.     Mental Status: [name] alert and oriented to person, place, and time.  Psychiatric:        Mood and Affect: Mood normal.        Behavior:  Behavior normal.        Thought Content: Thought content normal.        Judgment: Judgment normal.   Hard to appreciate the area that she is saying is a rash there is some other hyperpigmented areas that are chronic        Assessment & Plan:   HIV disease:  I am checking a viral load and CD4 count today I am giving her a Cabenuva IM every 2 month injection today   History of gonorrhea and chlamydia syphilis: Patient did not want STI testing today but will do in the future we are using doxycycline 200 mg after sex as postexposure prophylaxis  Transgender identity: We will recheck testosterone and estradiol though she says  she has not been on androgen blockade or estradiol for probably 5 months.  Vaccine counseling recommended flu shot updated COVID-19 shot --did not have in clinic yet.

## 2022-09-24 LAB — URINE CYTOLOGY ANCILLARY ONLY
Chlamydia: NEGATIVE
Comment: NEGATIVE
Comment: NORMAL
Neisseria Gonorrhea: NEGATIVE

## 2022-09-24 LAB — CYTOLOGY, (ORAL, ANAL, URETHRAL) ANCILLARY ONLY
Chlamydia: NEGATIVE
Comment: NEGATIVE
Comment: NORMAL
Neisseria Gonorrhea: NEGATIVE

## 2022-09-24 LAB — T-HELPER CELLS (CD4) COUNT (NOT AT ARMC)
CD4 % Helper T Cell: 40 % (ref 33–65)
CD4 T Cell Abs: 849 /uL (ref 400–1790)

## 2022-09-25 ENCOUNTER — Telehealth: Payer: Self-pay

## 2022-09-25 LAB — CYTOLOGY, (ORAL, ANAL, URETHRAL) ANCILLARY ONLY
Chlamydia: NEGATIVE
Comment: NEGATIVE
Comment: NORMAL
Neisseria Gonorrhea: POSITIVE — AB

## 2022-09-25 LAB — ESTRADIOL: Estradiol: 35 pg/mL (ref <=39)

## 2022-09-25 LAB — HIV-1 RNA QUANT-NO REFLEX-BLD
HIV 1 RNA Quant: <20 Copies/mL — ABNORMAL HIGH
HIV-1 RNA Quant, Log: <1.3 Log cps/mL — ABNORMAL HIGH

## 2022-09-25 LAB — TESTOSTERONE: Testosterone: 671 ng/dL (ref 250–827)

## 2022-09-25 LAB — RPR: RPR Ser Ql: NONREACTIVE

## 2022-09-25 NOTE — Telephone Encounter (Signed)
-----   Message from Truman Hayward, MD sent at 09/25/2022  3:55 PM EDT ----- Travis Palmer needs '500mg'$  IM ceftriaxone to treat her gonorrhea. Partners need to be treated/tested if HIV status unknown tested for HIV, referred for PrEP ----- Message ----- From: Interface, [facility] Lab Results In Sent: 09/24/2022   3:13 AM EDT To: Truman Hayward, MD

## 2022-09-25 NOTE — Telephone Encounter (Signed)
Called patient to relay results, no answer and unable to leave voicemail.   Beryle Flock, RN

## 2022-09-26 ENCOUNTER — Ambulatory Visit (INDEPENDENT_AMBULATORY_CARE_PROVIDER_SITE_OTHER): Payer: Self-pay

## 2022-09-26 ENCOUNTER — Other Ambulatory Visit: Payer: Self-pay

## 2022-09-26 DIAGNOSIS — A549 Gonococcal infection, unspecified: Secondary | ICD-10-CM

## 2022-09-26 MED ORDER — CEFTRIAXONE SODIUM 500 MG IJ SOLR
500.0000 mg | Freq: Once | INTRAMUSCULAR | Status: AC
  Administered 2022-09-26: 500 mg via INTRAMUSCULAR

## 2022-09-26 NOTE — Telephone Encounter (Signed)
I spoke to the patient and she will be coming in today for treatment. I have also explained to the patient that doxy will not treat gonorrhea. Patient verbalized understanding.  Travis Palmer Brooks Sailors

## 2022-10-24 ENCOUNTER — Other Ambulatory Visit: Payer: Self-pay

## 2022-10-24 DIAGNOSIS — Z79890 Hormone replacement therapy (postmenopausal): Secondary | ICD-10-CM

## 2022-10-24 MED ORDER — "BD HYPODERMIC NEEDLE 18G X 1"" MISC": 0 refills | Status: DC

## 2022-11-07 ENCOUNTER — Telehealth: Payer: Self-pay

## 2022-11-07 NOTE — Telephone Encounter (Signed)
[facility] Patient Advocate Encounter  Patient's medications (cabenuva) have been couriered to [facility] from [facility]: [phone], and will be administered on  11/14/2022.

## 2022-11-14 ENCOUNTER — Other Ambulatory Visit: Payer: Self-pay

## 2022-11-14 ENCOUNTER — Ambulatory Visit (INDEPENDENT_AMBULATORY_CARE_PROVIDER_SITE_OTHER): Payer: Self-pay | Admitting: Pharmacist

## 2022-11-14 DIAGNOSIS — B20 Human immunodeficiency virus [HIV] disease: Secondary | ICD-10-CM

## 2022-11-14 DIAGNOSIS — Z113 Encounter for screening for infections with a predominantly sexual mode of transmission: Secondary | ICD-10-CM

## 2022-11-14 MED ORDER — CABOTEGRAVIR & RILPIVIRINE ER 600 & 900 MG/3ML IM SUER
1.0000 | Freq: Once | INTRAMUSCULAR | Status: AC
  Administered 2022-11-14: 1 via INTRAMUSCULAR

## 2022-11-14 NOTE — Progress Notes (Signed)
HPI: Travis Palmer is a 26 y.o. adult who presents to the [facility] for [place] administration.  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   CABOTEGRAVIR & RILPIVIRINE ER (CABENUVA) 600 & 900 MG/3ML INJECTION    Inject 1 kit into the muscle every 2 (two) months.   DOXYCYCLINE (VIBRA-TABS) 100 MG TABLET    Take 2 tablets after unprotected sex   ESTRADIOL VALERATE (DELESTROGEN) 20 MG/ML INJECTION    ADMINISTER 0.75 ML(15 MG) IN THE MUSCLE 1 TIME A WEEK   NEEDLE, DISP, 18 G (BD HYPODERMIC NEEDLE) 18G X 1" MISC    USE AS DIRECTED.   SPIRONOLACTONE (ALDACTONE) 100 MG TABLET    TAKE 1 TABLET BY MOUTH EVERY MORNING WITH 50 MG   SPIRONOLACTONE (ALDACTONE) 50 MG TABLET    Take 1 tablet (50 mg total) by mouth daily. Take with one 100 mg tablet.   SYRINGE-NEEDLE, DISP, 3 ML (B-D 3CC LUER-LOK SYR 21GX1-1/2) 21G X 1-1/2" 3 ML MISC    Use as directed for delestrogen  Modified Medications   No medications on file  Discontinued Medications   No medications on file    Allergies: Allergies  Allergen Reactions   Apple Juice Itching, Swelling and Other (See Comments)    Mouth swells   Depakote [Divalproex Sodium] Other (See Comments)     Hospitalized for 3 days for extreme GI upset because of this    Past Medical History: Past Medical History:  Diagnosis Date   Depression 01/03/2021   Epigastric pain 10/01/2017   History of bipolar disorder    HIV infection ([place])    Housing problems 08/26/2016   Macrocytic anemia 01/03/2021   Rash 07/17/2022   Rectal pain 03/07/2020   Routine screening for STI (sexually transmitted infection) 01/26/2018   Syphilis 08/02/2015    Social History: Social History   Socioeconomic History   Marital status: Single    Spouse name: Not on file   Number of children: Not on file   Years of education: Not on file   Highest education level: Not on file  Occupational History   Not on file  Tobacco Use   Smoking status: Former    Packs/day: 0.00    Types: Cigarettes   Smokeless tobacco: Never  Vaping Use   Vaping Use: Never used  Substance and Sexual Activity   Alcohol use: Yes    Alcohol/week: 0.0 standard drinks of alcohol    Comment: weekend drinker   Drug use: Not Currently    Frequency: 7.0 times per week    Types: Marijuana    Comment: every other day   Sexual activity: Not Currently    Comment: accepted condoms  Other Topics  Concern   Not on file  Social History Narrative   Not on file   Social Determinants of Health   Financial Resource Strain: Not on file  Food Insecurity: Not on file  Transportation Needs: Not on file  Physical Activity: Not on file  Stress: Not on file  Social Connections: Not on file    Labs: Lab Results  Component Value Date   HIV1RNAQUANT <20 (H) 09/23/2022   HIV1RNAQUANT <20 (H) 07/17/2022   HIV1RNAQUANT <20 (H) 06/12/2022   HIV1RNAVL 50 11/06/2015   HIV1RNAVL 175 09/26/2015   HIV1RNAVL <40 07/19/2015   CD4TABS 849 09/23/2022   CD4TABS 745 12/27/2021   CD4TABS 529 06/19/2021    RPR and STI Lab Results  Component Value Date   LABRPR NON-REACTIVE 09/23/2022   LABRPR NON-REACTIVE 07/17/2022   LABRPR NON-REACTIVE 06/12/2022    LABRPR NON-REACTIVE 12/27/2021   LABRPR NON-REACTIVE 06/19/2021   RPRTITER 1:128 (A) 05/24/2015    STI Results GC GC CT CT  Latest Ref Rng & Units  NEGATIVE  NEGATIVE  09/23/2022  2:58 PM Negative    Positive    Negative   Negative    Negative    Negative    07/17/2022  3:23 PM Negative    Negative    Negative   Negative    Negative    Negative    06/12/2022  2:19 PM Negative    Negative    Negative   Positive    Negative    Negative    12/27/2021 11:58 AM Negative    Negative    Negative   Negative    Negative    Negative    06/19/2021 10:37 AM Negative    Negative    Negative   Negative    Negative    Negative    01/11/2021 12:03 PM Negative   Negative    07/26/2020  3:43 PM Negative    Negative    Negative   Negative    Negative    Negative    02/09/2020  2:41 PM Negative    Positive    Negative   Negative    Negative    Negative    07/28/2019 12:00 AM **POSITIVE**    **POSITIVE**    Negative   **POSITIVE**    **POSITIVE**    Negative    01/26/2018 12:00 AM Negative    Negative    **POSITIVE**  C  Negative    Negative    Negative  C   08/15/2017 12:00 AM Negative    Negative    Negative   Negative    Negative    Negative    04/29/2017 12:00 AM **POSITIVE**    Negative   Negative    **POSITIVE**    12/07/2014 12:00 AM NG: Negative   CT: Negative    12/20/2013  1:00 PM  NEGATIVE   NEGATIVE     C Corrected result   Multiple values from one day are sorted in reverse-chronological order    Hepatitis B Lab Results  Component Value Date   HEPBSAB NEG 12/07/2014   HEPBSAG Negative 02/10/2018   HEPBCAB NON REACTIVE 12/07/2014   Hepatitis C No results found for: "HEPCAB", "HCVRNAPCRQN" Hepatitis A Lab Results  Component Value Date   HAV REACTIVE (A) 12/07/2014   Lipids: Lab Results  Component Value Date   CHOL 127 12/27/2021   TRIG 50 12/27/2021   HDL 48 12/27/2021   CHOLHDL 2.6 12/27/2021   VLDL 18 01/31/2015  [place] 67 12/27/2021     TARGET DATE: The 11th  Assessment: Travis Palmer presents today for her maintenance Cabenuva injections. Past injections were tolerated well without issues except for mild pain x 1 day. She has a new yorkie puppy and wishes for it to become an emotional support animal. Requesting STI testing today so will do all three sites. Asked about flu and COVID vaccine which she declined.  Administered cabotegravir 684m/3mL in left upper outer quadrant of the gluteal muscle. Administered rilpivirine 900 mg/349min the right upper outer quadrant of the gluteal muscle. No issues with injections. She will follow up in 2 months for next set of injections.  Plan: - Cabenuva injections administered - HIV RNA, RPR, and urine/rectal/pharyngeal cytologies for GC/chlamydia today - Next injections scheduled for 01/13/23 with me - Call with any issues or questions  Latish Toutant L. Chizaram Latino, PharmD, BCIDP, AAHIVP, CPDaguaolinical Pharmacist Practitioner [place] [facility]

## 2022-11-15 LAB — CYTOLOGY, (ORAL, ANAL, URETHRAL) ANCILLARY ONLY
Chlamydia: NEGATIVE
Chlamydia: NEGATIVE
Comment: NEGATIVE
Comment: NEGATIVE
Comment: NORMAL
Comment: NORMAL
Neisseria Gonorrhea: NEGATIVE
Neisseria Gonorrhea: NEGATIVE

## 2022-11-15 LAB — URINE CYTOLOGY ANCILLARY ONLY
Chlamydia: NEGATIVE
Comment: NEGATIVE
Comment: NORMAL
Neisseria Gonorrhea: NEGATIVE

## 2022-11-17 LAB — RPR: RPR Ser Ql: NONREACTIVE

## 2022-11-17 LAB — HIV-1 RNA QUANT-NO REFLEX-BLD
HIV 1 RNA Quant: <20 Copies/mL — ABNORMAL HIGH
HIV-1 RNA Quant, Log: <1.3 Log cps/mL — ABNORMAL HIGH

## 2023-01-09 ENCOUNTER — Telehealth: Payer: Self-pay

## 2023-01-09 NOTE — Telephone Encounter (Signed)
[facility] Patient Advocate Encounter  Patient's medication Kern Reap) have been couriered to [facility] from [employer] and will be administered on the patient next office visit on 01/13/22.  [name] , [facility] Patient [facility] for Infectious Disease Phone: [phone] Fax:  [phone]

## 2023-01-13 ENCOUNTER — Other Ambulatory Visit: Payer: Self-pay

## 2023-01-13 ENCOUNTER — Ambulatory Visit (INDEPENDENT_AMBULATORY_CARE_PROVIDER_SITE_OTHER): Payer: Self-pay | Admitting: Pharmacist

## 2023-01-13 DIAGNOSIS — B20 Human immunodeficiency virus [HIV] disease: Secondary | ICD-10-CM

## 2023-01-13 DIAGNOSIS — Z113 Encounter for screening for infections with a predominantly sexual mode of transmission: Secondary | ICD-10-CM

## 2023-01-13 MED ORDER — CABOTEGRAVIR & RILPIVIRINE ER 600 & 900 MG/3ML IM SUER
1.0000 | Freq: Once | INTRAMUSCULAR | Status: AC
  Administered 2023-01-13: 1 via INTRAMUSCULAR

## 2023-01-13 MED ORDER — DOXYCYCLINE HYCLATE 100 MG PO TABS: ORAL_TABLET | ORAL | 5 refills | Status: DC

## 2023-01-13 NOTE — Progress Notes (Signed)
HPI: Travis Palmer is a 27 y.o. adult who presents to the [facility] for [place] administration.  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   CABOTEGRAVIR & RILPIVIRINE ER (CABENUVA) 600 & 900 MG/3ML INJECTION    Inject 1 kit into the muscle every 2 (two) months.   DOXYCYCLINE (VIBRA-TABS) 100 MG TABLET    Take 2 tablets after unprotected sex   ESTRADIOL VALERATE (DELESTROGEN) 20 MG/ML INJECTION    ADMINISTER 0.75 ML(15 MG) IN THE MUSCLE 1 TIME A WEEK   NEEDLE, DISP, 18 G (BD HYPODERMIC NEEDLE) 18G X 1" MISC    USE AS DIRECTED.   SPIRONOLACTONE (ALDACTONE) 100 MG TABLET    TAKE 1 TABLET BY MOUTH EVERY MORNING WITH 50 MG   SPIRONOLACTONE (ALDACTONE) 50 MG TABLET    Take 1 tablet (50 mg total) by mouth daily. Take with one 100 mg tablet.   SYRINGE-NEEDLE, DISP, 3 ML (B-D 3CC LUER-LOK SYR 21GX1-1/2) 21G X 1-1/2" 3 ML MISC    Use as directed for delestrogen  Modified Medications   No medications on file  Discontinued Medications   No medications on file    Allergies: Allergies  Allergen Reactions   Apple Juice Itching, Swelling and Other (See Comments)    Mouth swells   Depakote [Divalproex Sodium] Other (See Comments)     Hospitalized for 3 days for extreme GI upset because of this    Past Medical History: Past Medical History:  Diagnosis Date   Depression 01/03/2021   Epigastric pain 10/01/2017   History of bipolar disorder    HIV infection ([place])    Housing problems 08/26/2016   Macrocytic anemia 01/03/2021   Rash 07/17/2022   Rectal pain 03/07/2020   Routine screening for STI (sexually transmitted infection) 01/26/2018   Syphilis 08/02/2015    Social History: Social History   Socioeconomic History   Marital status: Single    Spouse name: Not on file   Number of children: Not on file   Years of education: Not on file   Highest education level: Not on file  Occupational History   Not on file  Tobacco Use   Smoking status: Former    Packs/day: 0.00    Types: Cigarettes   Smokeless tobacco: Never  Vaping Use   Vaping Use: Never used  Substance and Sexual Activity   Alcohol use: Yes    Alcohol/week: 0.0 standard drinks of alcohol    Comment: weekend drinker   Drug use: Not Currently    Frequency: 7.0 times per week    Types: Marijuana    Comment: every other day   Sexual activity: Not Currently    Comment: accepted condoms  Other Topics  Concern   Not on file  Social History Narrative   Not on file   Social Determinants of Health   Financial Resource Strain: Not on file  Food Insecurity: Not on file  Transportation Needs: Not on file  Physical Activity: Not on file  Stress: Not on file  Social Connections: Not on file    Labs: Lab Results  Component Value Date   HIV1RNAQUANT <20 (H) 11/14/2022   HIV1RNAQUANT <20 (H) 09/23/2022   HIV1RNAQUANT <20 (H) 07/17/2022   HIV1RNAVL 50 11/06/2015   HIV1RNAVL 175 09/26/2015   HIV1RNAVL <40 07/19/2015   CD4TABS 849 09/23/2022   CD4TABS 745 12/27/2021   CD4TABS 529 06/19/2021    RPR and STI Lab Results  Component Value Date   LABRPR NON-REACTIVE 11/14/2022   LABRPR NON-REACTIVE 09/23/2022   LABRPR NON-REACTIVE 07/17/2022    LABRPR NON-REACTIVE 06/12/2022   LABRPR NON-REACTIVE 12/27/2021   RPRTITER 1:128 (A) 05/24/2015    STI Results GC GC CT CT  Latest Ref Rng & Units  NEGATIVE  NEGATIVE  11/14/2022  2:28 PM Negative    Negative    Negative   Negative    Negative    Negative    09/23/2022  2:58 PM Negative    Positive    Negative   Negative    Negative    Negative    07/17/2022  3:23 PM Negative    Negative    Negative   Negative    Negative    Negative    06/12/2022  2:19 PM Negative    Negative    Negative   Positive    Negative    Negative    12/27/2021 11:58 AM Negative    Negative    Negative   Negative    Negative    Negative    06/19/2021 10:37 AM Negative    Negative    Negative   Negative    Negative    Negative    01/11/2021 12:03 PM Negative   Negative    07/26/2020  3:43 PM Negative    Negative    Negative   Negative    Negative    Negative    02/09/2020  2:41 PM Negative    Positive    Negative   Negative    Negative    Negative    07/28/2019 12:00 AM **POSITIVE**    **POSITIVE**    Negative   **POSITIVE**    **POSITIVE**    Negative    01/26/2018 12:00 AM Negative    Negative    **POSITIVE**  C  Negative    Negative    Negative  C   08/15/2017 12:00 AM Negative    Negative    Negative   Negative    Negative    Negative    04/29/2017 12:00 AM **POSITIVE**    Negative   Negative    **POSITIVE**    12/07/2014 12:00 AM NG: Negative   CT: Negative    12/20/2013  1:00 PM  NEGATIVE   NEGATIVE     C Corrected result   Multiple values from one day are sorted in reverse-chronological order    Hepatitis B Lab Results  Component Value Date   HEPBSAB NEG 12/07/2014   HEPBSAG Negative 02/10/2018   HEPBCAB NON REACTIVE 12/07/2014   Hepatitis C No results found for: "HEPCAB", "HCVRNAPCRQN" Hepatitis A Lab Results  Component Value Date   HAV REACTIVE (A) 12/07/2014   Lipids: Lab Results  Component  Value Date   CHOL 127 12/27/2021   TRIG 50 12/27/2021    HDL 48 12/27/2021   CHOLHDL 2.6 12/27/2021   VLDL 18 01/31/2015   LDLCALC 67 12/27/2021    TARGET DATE: The 10th  Assessment: Travis Palmer presents today for her maintenance Cabenuva injections. Past injections were tolerated well without issues. Agrees to STI testing today. She has been on [place] injections since July 2023. Will check a HIV RNA today and then start spacing out her labs to every other visit as long as she remains undetectable.  Administered cabotegravir '600mg'$ /53m in left upper outer quadrant of the gluteal muscle. Administered rilpivirine 900 mg/316min the right upper outer quadrant of the gluteal muscle. No issues with injections. She will follow up in 2 months for next set of injections.  Plan: - Cabenuva injections administered - HIV RNA, RPR, and urine/rectal/pharyngeal cytologies for GC/chlamydia today - Next injections scheduled for 03/12/23 - Call with any issues or questions  Travis Palmer, PharmD, BCIDP, AAHIVP, CPLovettsvillelinical Pharmacist Practitioner [place]

## 2023-01-14 LAB — CYTOLOGY, (ORAL, ANAL, URETHRAL) ANCILLARY ONLY
Chlamydia: NEGATIVE
Chlamydia: NEGATIVE
Comment: NEGATIVE
Comment: NEGATIVE
Comment: NORMAL
Comment: NORMAL
Neisseria Gonorrhea: NEGATIVE
Neisseria Gonorrhea: NEGATIVE

## 2023-01-14 LAB — URINE CYTOLOGY ANCILLARY ONLY
Chlamydia: NEGATIVE
Comment: NEGATIVE
Comment: NORMAL
Neisseria Gonorrhea: NEGATIVE

## 2023-01-15 LAB — HIV-1 RNA QUANT-NO REFLEX-BLD
HIV 1 RNA Quant: NOT DETECTED Copies/mL
HIV-1 RNA Quant, Log: NOT DETECTED Log cps/mL

## 2023-01-15 LAB — RPR: RPR Ser Ql: NONREACTIVE

## 2023-03-04 ENCOUNTER — Telehealth: Payer: Self-pay

## 2023-03-04 NOTE — Telephone Encounter (Signed)
[facility] Patient Advocate Encounter  Patient's medication Kern Reap) have been couriered to [facility] from [employer] and will be administered on the patient next office visit on 03/12/23.  [name] , [facility] Patient [facility] Phone: [phone] Fax:  [phone]

## 2023-03-12 ENCOUNTER — Ambulatory Visit: Payer: Self-pay | Admitting: Pharmacist

## 2023-03-13 ENCOUNTER — Other Ambulatory Visit: Payer: Self-pay

## 2023-03-13 ENCOUNTER — Ambulatory Visit (INDEPENDENT_AMBULATORY_CARE_PROVIDER_SITE_OTHER): Payer: Medicaid Other | Admitting: Pharmacist

## 2023-03-13 DIAGNOSIS — Z23 Encounter for immunization: Secondary | ICD-10-CM | POA: Diagnosis not present

## 2023-03-13 DIAGNOSIS — B20 Human immunodeficiency virus [HIV] disease: Secondary | ICD-10-CM | POA: Diagnosis not present

## 2023-03-13 DIAGNOSIS — Z113 Encounter for screening for infections with a predominantly sexual mode of transmission: Secondary | ICD-10-CM

## 2023-03-13 MED ORDER — CABOTEGRAVIR & RILPIVIRINE ER 600 & 900 MG/3ML IM SUER
1.0000 | Freq: Once | INTRAMUSCULAR | Status: AC
Start: 2023-03-13 — End: 2023-03-13
  Administered 2023-03-13: 1 via INTRAMUSCULAR

## 2023-03-13 NOTE — Progress Notes (Signed)
HPI: Travis Palmer is a 27 y.o. adult who presents to the [facility] for [place] administration.  Patient Active Problem List   Diagnosis Date Noted   Rash 07/17/2022   Depression 01/03/2021   Macrocytic anemia 01/03/2021   Rectal pain 03/07/2020   Calculus of bile duct without cholangitis with obstruction    Elevated LFTs    Common bile duct dilation 02/10/2018   Routine screening for STI (sexually transmitted infection) 01/26/2018   Abdominal pain, epigastric 10/01/2017   GC (gonococcus infection) 05/07/2017   Chlamydia 05/07/2017   Housing problems 08/26/2016   Syphilis 08/02/2015   HIV disease ([place]) 01/09/2015   Transgender 01/09/2015   Cigarette smoker 01/09/2015    Patient's Medications  New Prescriptions   No medications on file  Previous Medications   CABOTEGRAVIR & RILPIVIRINE ER (CABENUVA) 600 & 900 MG/3ML INJECTION    Inject 1 kit into the muscle every 30 (thirty) days.   CABOTEGRAVIR & RILPIVIRINE ER (CABENUVA) 600 & 900 MG/3ML INJECTION    Inject 1 kit into the muscle every 2 (two) months.   DOXYCYCLINE (VIBRA-TABS) 100 MG TABLET    Take 2 tablets after unprotected sex   ESTRADIOL VALERATE (DELESTROGEN) 20 MG/ML INJECTION    ADMINISTER 0.75 ML(15 MG) IN THE MUSCLE 1 TIME A WEEK   NEEDLE, DISP, 18 G (BD HYPODERMIC NEEDLE) 18G X 1" MISC    USE AS DIRECTED.   SPIRONOLACTONE (ALDACTONE) 100 MG TABLET    TAKE 1 TABLET BY MOUTH EVERY MORNING WITH 50 MG   SPIRONOLACTONE (ALDACTONE) 50 MG TABLET    Take 1 tablet (50 mg total) by mouth daily. Take with one 100 mg tablet.   SYRINGE-NEEDLE, DISP, 3 ML (B-D 3CC LUER-LOK SYR 21GX1-1/2) 21G X 1-1/2" 3 ML MISC    Use as directed for delestrogen  Modified Medications   No medications on file  Discontinued Medications   No medications on file    Allergies: Allergies  Allergen Reactions   Apple Juice Itching, Swelling and Other (See Comments)    Mouth swells   Depakote [Divalproex Sodium] Other (See Comments)     Hospitalized for 3 days for extreme GI upset because of this    Past Medical History: Past Medical History:  Diagnosis Date   Depression 01/03/2021   Epigastric pain 10/01/2017   History of bipolar disorder    HIV infection ([place])    Housing problems 08/26/2016   Macrocytic anemia 01/03/2021   Rash 07/17/2022   Rectal pain 03/07/2020   Routine screening for STI (sexually transmitted infection) 01/26/2018   Syphilis 08/02/2015    Social History: Social History   Socioeconomic History   Marital status: Single    Spouse name: Not on file   Number of children: Not on file   Years of education: Not on file   Highest education level: Not on file  Occupational History   Not on file  Tobacco Use   Smoking status: Former    Packs/day: 0    Types: Cigarettes   Smokeless tobacco: Never  Vaping Use   Vaping Use: Never used  Substance and Sexual Activity   Alcohol use: Yes    Alcohol/week: 0.0 standard drinks of alcohol    Comment: weekend drinker   Drug use: Not Currently    Frequency: 7.0 times per week    Types: Marijuana    Comment: every other day   Sexual activity: Not Currently    Comment: accepted condoms  Other Topics  Concern   Not on file  Social History Narrative   Not on file   Social Determinants of Health   Financial Resource Strain: Not on file  Food Insecurity: Not on file  Transportation Needs: Not on file  Physical Activity: Not on file  Stress: Not on file  Social Connections: Not on file    Labs: Lab Results  Component Value Date   HIV1RNAQUANT Not Detected 01/13/2023   HIV1RNAQUANT <20 (H) 11/14/2022   HIV1RNAQUANT <20 (H) 09/23/2022   HIV1RNAVL 50 11/06/2015   HIV1RNAVL 175 09/26/2015   HIV1RNAVL <40 07/19/2015   CD4TABS 849 09/23/2022   CD4TABS 745 12/27/2021   CD4TABS 529 06/19/2021    RPR and STI Lab Results  Component Value Date   LABRPR NON-REACTIVE 01/13/2023   LABRPR NON-REACTIVE 11/14/2022   LABRPR NON-REACTIVE 09/23/2022    LABRPR NON-REACTIVE 07/17/2022   LABRPR NON-REACTIVE 06/12/2022   RPRTITER 1:128 (A) 05/24/2015    STI Results GC GC CT CT  Latest Ref Rng & Units  NEGATIVE  NEGATIVE  01/13/2023  3:12 PM Negative    Negative    Negative   Negative    Negative    Negative    11/14/2022  2:28 PM Negative    Negative    Negative   Negative    Negative    Negative    09/23/2022  2:58 PM Negative    Positive    Negative   Negative    Negative    Negative    07/17/2022  3:23 PM Negative    Negative    Negative   Negative    Negative    Negative    06/12/2022  2:19 PM Negative    Negative    Negative   Positive    Negative    Negative    12/27/2021 11:58 AM Negative    Negative    Negative   Negative    Negative    Negative    06/19/2021 10:37 AM Negative    Negative    Negative   Negative    Negative    Negative    01/11/2021 12:03 PM Negative   Negative    07/26/2020  3:43 PM Negative    Negative    Negative   Negative    Negative    Negative    02/09/2020  2:41 PM Negative    Positive    Negative   Negative    Negative    Negative    07/28/2019 12:00 AM **POSITIVE**    **POSITIVE**    Negative   **POSITIVE**    **POSITIVE**    Negative    01/26/2018 12:00 AM Negative    Negative    **POSITIVE**  C  Negative    Negative    Negative  C   08/15/2017 12:00 AM Negative    Negative    Negative   Negative    Negative    Negative    04/29/2017 12:00 AM **POSITIVE**    Negative   Negative    **POSITIVE**    12/07/2014 12:00 AM NG: Negative   CT: Negative    12/20/2013  1:00 PM  NEGATIVE   NEGATIVE     C Corrected result   Multiple values from one day are sorted in reverse-chronological order    Hepatitis B Lab Results  Component Value Date   HEPBSAB NEG 12/07/2014   HEPBSAG Negative 02/10/2018   HEPBCAB NON REACTIVE 12/07/2014   Hepatitis C  No results found for: "HEPCAB", "HCVRNAPCRQN" Hepatitis A Lab Results  Component Value Date   HAV REACTIVE (A) 12/07/2014    Lipids: Lab Results  Component Value Date   CHOL 127 12/27/2021   TRIG 50 12/27/2021   HDL 48 12/27/2021   CHOLHDL 2.6 12/27/2021   VLDL 18 01/31/2015   LDLCALC 67 12/27/2021    TARGET DATE:  The 10th of the month  Current HIV Regimen: Cabenuva  Assessment: Travis Palmer presents today for their maintenance Cabenuva injections. Initial/past injections were tolerated well without issues. No problems with systemic effects of injections.   Administered cabotegravir 600mg /69mL in left upper outer quadrant of the gluteal muscle. Administered rilpivirine 900 mg/46mL in the right upper outer quadrant of the gluteal muscle. Monitored patient for 10 minutes after injection. Injections were tolerated well without issue. Patient will follow up in 2 months for next injection. Will defer HIV RNA as recently checked in February.  Patient requests full STI testing with RPR and urine/oral/rectal cytologies. Reports new sexual partners since last visit. Due for 3rd HPV vaccine which was administered today. She was also agreeable for Tdap booster today.   Plan: - Cabenuva injections administered - Check RPR and urine/oral/rectal cytologies - Administer HPV #3 and Tdap  - Next injections scheduled for 6/4 with Dr. Tommy Medal and 8/5 with Cassie  - Call with any issues or questions  Alfonse Spruce, PharmD, CPP, BCIDP, [place] Clinical Pharmacist Practitioner Infectious Diseases [facility]

## 2023-03-14 LAB — RPR: RPR Ser Ql: NONREACTIVE

## 2023-03-17 LAB — CYTOLOGY, (ORAL, ANAL, URETHRAL) ANCILLARY ONLY
Chlamydia: NEGATIVE
Chlamydia: NEGATIVE
Comment: NEGATIVE
Comment: NEGATIVE
Comment: NORMAL
Comment: NORMAL
Neisseria Gonorrhea: NEGATIVE
Neisseria Gonorrhea: NEGATIVE

## 2023-03-17 LAB — URINE CYTOLOGY ANCILLARY ONLY
Chlamydia: NEGATIVE
Comment: NEGATIVE
Comment: NORMAL
Neisseria Gonorrhea: NEGATIVE

## 2023-03-20 ENCOUNTER — Emergency Department (HOSPITAL_COMMUNITY): Payer: Medicaid Other

## 2023-03-20 ENCOUNTER — Encounter (HOSPITAL_COMMUNITY): Payer: Self-pay | Admitting: Surgery

## 2023-03-20 ENCOUNTER — Other Ambulatory Visit: Payer: Self-pay

## 2023-03-20 ENCOUNTER — Emergency Department (HOSPITAL_COMMUNITY): Payer: Medicaid Other | Admitting: Anesthesiology

## 2023-03-20 ENCOUNTER — Inpatient Hospital Stay (HOSPITAL_COMMUNITY)
Admission: EM | Admit: 2023-03-20 | Discharge: 2023-03-26 | DRG: 025 | Disposition: A | Discharge status: Home / Self Care | Payer: Medicaid Other | Attending: Surgery | Admitting: Surgery

## 2023-03-20 ENCOUNTER — Encounter (HOSPITAL_COMMUNITY): Admission: EM | Disposition: A | Discharge status: Home / Self Care | Payer: Self-pay | Source: Home / Self Care

## 2023-03-20 ENCOUNTER — Inpatient Hospital Stay (HOSPITAL_COMMUNITY): Payer: Medicaid Other

## 2023-03-20 DIAGNOSIS — F32A Depression, unspecified: Secondary | ICD-10-CM | POA: Diagnosis not present

## 2023-03-20 DIAGNOSIS — Z79890 Hormone replacement therapy (postmenopausal): Secondary | ICD-10-CM | POA: Diagnosis not present

## 2023-03-20 DIAGNOSIS — Z87891 Personal history of nicotine dependence: Secondary | ICD-10-CM

## 2023-03-20 DIAGNOSIS — E876 Hypokalemia: Secondary | ICD-10-CM | POA: Diagnosis present

## 2023-03-20 DIAGNOSIS — Z79899 Other long term (current) drug therapy: Secondary | ICD-10-CM

## 2023-03-20 DIAGNOSIS — R402362 Coma scale, best motor response, obeys commands, at arrival to emergency department: Secondary | ICD-10-CM | POA: Diagnosis not present

## 2023-03-20 DIAGNOSIS — W3400XA Accidental discharge from unspecified firearms or gun, initial encounter: Secondary | ICD-10-CM | POA: Diagnosis not present

## 2023-03-20 DIAGNOSIS — S020XXB Fracture of vault of skull, initial encounter for open fracture: Principal | ICD-10-CM | POA: Diagnosis present

## 2023-03-20 DIAGNOSIS — S0184XA Puncture wound with foreign body of other part of head, initial encounter: Secondary | ICD-10-CM | POA: Diagnosis not present

## 2023-03-20 DIAGNOSIS — S0291XA Unspecified fracture of skull, initial encounter for closed fracture: Secondary | ICD-10-CM | POA: Diagnosis present

## 2023-03-20 DIAGNOSIS — F940 Selective mutism: Secondary | ICD-10-CM | POA: Diagnosis present

## 2023-03-20 DIAGNOSIS — Z23 Encounter for immunization: Secondary | ICD-10-CM

## 2023-03-20 DIAGNOSIS — Z21 Asymptomatic human immunodeficiency virus [HIV] infection status: Secondary | ICD-10-CM | POA: Diagnosis present

## 2023-03-20 DIAGNOSIS — F061 Catatonic disorder due to known physiological condition: Secondary | ICD-10-CM | POA: Diagnosis not present

## 2023-03-20 DIAGNOSIS — S0180XA Unspecified open wound of other part of head, initial encounter: Secondary | ICD-10-CM | POA: Diagnosis not present

## 2023-03-20 DIAGNOSIS — Z91199 Patient's noncompliance with other medical treatment and regimen due to unspecified reason: Secondary | ICD-10-CM

## 2023-03-20 DIAGNOSIS — F909 Attention-deficit hyperactivity disorder, unspecified type: Secondary | ICD-10-CM | POA: Diagnosis present

## 2023-03-20 DIAGNOSIS — R402252 Coma scale, best verbal response, oriented, at arrival to emergency department: Secondary | ICD-10-CM | POA: Diagnosis present

## 2023-03-20 DIAGNOSIS — E871 Hypo-osmolality and hyponatremia: Secondary | ICD-10-CM | POA: Diagnosis not present

## 2023-03-20 DIAGNOSIS — S0190XA Unspecified open wound of unspecified part of head, initial encounter: Secondary | ICD-10-CM

## 2023-03-20 DIAGNOSIS — S0193XA Puncture wound without foreign body of unspecified part of head, initial encounter: Secondary | ICD-10-CM | POA: Diagnosis present

## 2023-03-20 DIAGNOSIS — G936 Cerebral edema: Secondary | ICD-10-CM | POA: Diagnosis present

## 2023-03-20 DIAGNOSIS — S065XAA Traumatic subdural hemorrhage with loss of consciousness status unknown, initial encounter: Secondary | ICD-10-CM

## 2023-03-20 DIAGNOSIS — F640 Transsexualism: Secondary | ICD-10-CM | POA: Diagnosis present

## 2023-03-20 DIAGNOSIS — Y249XXA Unspecified firearm discharge, undetermined intent, initial encounter: Secondary | ICD-10-CM

## 2023-03-20 DIAGNOSIS — B20 Human immunodeficiency virus [HIV] disease: Secondary | ICD-10-CM | POA: Diagnosis not present

## 2023-03-20 DIAGNOSIS — I4891 Unspecified atrial fibrillation: Secondary | ICD-10-CM | POA: Diagnosis not present

## 2023-03-20 DIAGNOSIS — R402142 Coma scale, eyes open, spontaneous, at arrival to emergency department: Secondary | ICD-10-CM | POA: Diagnosis present

## 2023-03-20 DIAGNOSIS — S0632AA Contusion and laceration of left cerebrum with loss of consciousness status unknown, initial encounter: Secondary | ICD-10-CM | POA: Diagnosis present

## 2023-03-20 DIAGNOSIS — S0633AA Contusion and laceration of cerebrum, unspecified, with loss of consciousness status unknown, initial encounter: Secondary | ICD-10-CM

## 2023-03-20 DIAGNOSIS — S0291XB Unspecified fracture of skull, initial encounter for open fracture: Secondary | ICD-10-CM

## 2023-03-20 HISTORY — DX: Human immunodeficiency virus (HIV) disease: B20

## 2023-03-20 HISTORY — DX: Asymptomatic human immunodeficiency virus (hiv) infection status: Z21

## 2023-03-20 HISTORY — PX: CRANIOTOMY: SHX93

## 2023-03-20 LAB — BASIC METABOLIC PANEL
Anion gap: 11 (ref 5–15)
BUN: 7 mg/dL (ref 6–20)
CO2: 22 mmol/L (ref 22–32)
Calcium: 7.9 mg/dL — ABNORMAL LOW (ref 8.9–10.3)
Chloride: 104 mmol/L (ref 98–111)
Creatinine, Ser: 0.72 mg/dL (ref 0.61–1.24)
GFR, Estimated: >60 mL/min (ref >60)
Glucose, Bld: 129 mg/dL — ABNORMAL HIGH (ref 70–99)
Potassium: 3.3 mmol/L — ABNORMAL LOW (ref 3.5–5.1)
Sodium: 137 mmol/L (ref 135–145)

## 2023-03-20 LAB — COMPREHENSIVE METABOLIC PANEL
ALT: 29 U/L (ref 0–44)
AST: 28 U/L (ref 15–41)
Albumin: 3.9 g/dL (ref 3.5–5.0)
Alkaline Phosphatase: 48 U/L (ref 38–126)
Anion gap: 12 (ref 5–15)
BUN: 10 mg/dL (ref 6–20)
CO2: 20 mmol/L — ABNORMAL LOW (ref 22–32)
Calcium: 8.1 mg/dL — ABNORMAL LOW (ref 8.9–10.3)
Chloride: 103 mmol/L (ref 98–111)
Creatinine, Ser: 1.09 mg/dL (ref 0.61–1.24)
GFR, Estimated: >60 mL/min (ref >60)
Glucose, Bld: 187 mg/dL — ABNORMAL HIGH (ref 70–99)
Potassium: 3.3 mmol/L — ABNORMAL LOW (ref 3.5–5.1)
Sodium: 135 mmol/L (ref 135–145)
Total Bilirubin: 0.5 mg/dL (ref 0.3–1.2)
Total Protein: 6.7 g/dL (ref 6.5–8.1)

## 2023-03-20 LAB — CBC
HCT: 40.2 % (ref 39.0–52.0)
HCT: 36.4 % — ABNORMAL LOW (ref 39.0–52.0)
Hemoglobin: 13.6 g/dL (ref 13.0–17.0)
Hemoglobin: 12.8 g/dL — ABNORMAL LOW (ref 13.0–17.0)
MCH: 32.1 pg (ref 26.0–34.0)
MCH: 32.3 pg (ref 26.0–34.0)
MCHC: 33.8 g/dL (ref 30.0–36.0)
MCHC: 35.2 g/dL (ref 30.0–36.0)
MCV: 94.8 fL (ref 80.0–100.0)
MCV: 91.9 fL (ref 80.0–100.0)
Platelets: 291 10*3/uL (ref 150–400)
Platelets: 264 10*3/uL (ref 150–400)
RBC: 4.24 MIL/uL (ref 4.22–5.81)
RBC: 3.96 MIL/uL — ABNORMAL LOW (ref 4.22–5.81)
RDW: 12.4 % (ref 11.5–15.5)
RDW: 12.3 % (ref 11.5–15.5)
WBC: 10 10*3/uL (ref 4.0–10.5)
WBC: 15.1 10*3/uL — ABNORMAL HIGH (ref 4.0–10.5)
nRBC: 0 % (ref 0.0–0.2)
nRBC: 0 % (ref 0.0–0.2)

## 2023-03-20 LAB — RAPID URINE DRUG SCREEN, HOSP PERFORMED
Amphetamines: NOT DETECTED
Barbiturates: NOT DETECTED
Benzodiazepines: NOT DETECTED
Cocaine: NOT DETECTED
Opiates: NOT DETECTED
Tetrahydrocannabinol: POSITIVE — AB

## 2023-03-20 LAB — PREPARE RBC (CROSSMATCH)

## 2023-03-20 LAB — POCT I-STAT 7, (LYTES, BLD GAS, ICA,H+H)
Acid-base deficit: 5 mmol/L — ABNORMAL HIGH (ref 0.0–2.0)
Bicarbonate: 22.2 mmol/L (ref 20.0–28.0)
Calcium, Ion: 1.12 mmol/L — ABNORMAL LOW (ref 1.15–1.40)
HCT: 37 % — ABNORMAL LOW (ref 39.0–52.0)
Hemoglobin: 12.6 g/dL — ABNORMAL LOW (ref 13.0–17.0)
O2 Saturation: 100 %
Potassium: 3.2 mmol/L — ABNORMAL LOW (ref 3.5–5.1)
Sodium: 140 mmol/L (ref 135–145)
TCO2: 24 mmol/L (ref 22–32)
pCO2 arterial: 50.3 mmHg — ABNORMAL HIGH (ref 32–48)
pH, Arterial: 7.253 — ABNORMAL LOW (ref 7.35–7.45)
pO2, Arterial: 469 mmHg — ABNORMAL HIGH (ref 83–108)

## 2023-03-20 LAB — I-STAT CHEM 8, ED
BUN: 10 mg/dL (ref 6–20)
Calcium, Ion: 1.08 mmol/L — ABNORMAL LOW (ref 1.15–1.40)
Chloride: 102 mmol/L (ref 98–111)
Creatinine, Ser: 1 mg/dL (ref 0.61–1.24)
Glucose, Bld: 187 mg/dL — ABNORMAL HIGH (ref 70–99)
HCT: 43 % (ref 39.0–52.0)
Hemoglobin: 14.6 g/dL (ref 13.0–17.0)
Potassium: 3.4 mmol/L — ABNORMAL LOW (ref 3.5–5.1)
Sodium: 139 mmol/L (ref 135–145)
TCO2: 22 mmol/L (ref 22–32)

## 2023-03-20 LAB — I-STAT BETA HCG BLOOD, ED (MC, WL, AP ONLY): I-stat hCG, quantitative: <5 m[IU]/mL (ref <5)

## 2023-03-20 LAB — ETHANOL: Alcohol, Ethyl (B): <10 mg/dL (ref <10)

## 2023-03-20 LAB — MRSA NEXT GEN BY PCR, NASAL: MRSA by PCR Next Gen: NOT DETECTED

## 2023-03-20 LAB — ABO/RH: ABO/RH(D): A POS

## 2023-03-20 LAB — TYPE AND SCREEN
Unit division: 0
Unit division: 0

## 2023-03-20 LAB — BPAM RBC
ISSUE DATE / TIME: 202404110658
ISSUE DATE / TIME: 202404110658
Unit Type and Rh: 6200

## 2023-03-20 SURGERY — CRANIOTOMY HEMATOMA EVACUATION SUBDURAL
Anesthesia: General | Laterality: Left

## 2023-03-20 MED ORDER — TETANUS-DIPHTHERIA TOXOIDS TD 5-2 LFU IM INJ: 0.5000 mL | INJECTION | Freq: Once | INTRAMUSCULAR | Status: DC

## 2023-03-20 MED ORDER — HYDROMORPHONE HCL 1 MG/ML IJ SOLN: 0.2500 mg | INTRAMUSCULAR | Status: DC | PRN

## 2023-03-20 MED ORDER — SODIUM CHLORIDE 0.9% IV SOLUTION: Freq: Once | INTRAVENOUS | Status: DC

## 2023-03-20 MED ORDER — PROPOFOL 10 MG/ML IV BOLUS
INTRAVENOUS | Status: AC
  Filled 2023-03-20: qty 20

## 2023-03-20 MED ORDER — SUCCINYLCHOLINE CHLORIDE 200 MG/10ML IV SOSY
PREFILLED_SYRINGE | INTRAVENOUS | Status: DC | PRN
  Administered 2023-03-20: 120 mg via INTRAVENOUS

## 2023-03-20 MED ORDER — CHLORHEXIDINE GLUCONATE CLOTH 2 % EX PADS
6.0000 | MEDICATED_PAD | Freq: Every day | CUTANEOUS | Status: DC
  Administered 2023-03-20 – 2023-03-25 (×7): 6 via TOPICAL

## 2023-03-20 MED ORDER — OXYCODONE HCL 5 MG PO TABS: 5.0000 mg | ORAL_TABLET | Freq: Once | ORAL | Status: DC | PRN

## 2023-03-20 MED ORDER — OXYCODONE HCL 5 MG/5ML PO SOLN: 5.0000 mg | Freq: Once | ORAL | Status: DC | PRN

## 2023-03-20 MED ORDER — ONDANSETRON HCL 4 MG/2ML IJ SOLN
4.0000 mg | INTRAMUSCULAR | Status: DC | PRN
  Administered 2023-03-20 (×2): 4 mg via INTRAVENOUS
  Filled 2023-03-20 (×2): qty 2

## 2023-03-20 MED ORDER — ONDANSETRON HCL 4 MG/2ML IJ SOLN
4.0000 mg | Freq: Once | INTRAMUSCULAR | Status: AC
  Administered 2023-03-20: 4 mg via INTRAVENOUS
  Filled 2023-03-20: qty 2

## 2023-03-20 MED ORDER — ONDANSETRON HCL 4 MG PO TABS: 4.0000 mg | ORAL_TABLET | ORAL | Status: DC | PRN

## 2023-03-20 MED ORDER — DEXAMETHASONE SODIUM PHOSPHATE 10 MG/ML IJ SOLN
INTRAMUSCULAR | Status: DC | PRN
  Administered 2023-03-20: 10 mg via INTRAVENOUS

## 2023-03-20 MED ORDER — ATROPINE SULFATE 0.4 MG/ML IV SOLN
INTRAVENOUS | Status: DC | PRN
  Administered 2023-03-20: .2 mg via INTRAVENOUS

## 2023-03-20 MED ORDER — THROMBIN 20000 UNITS EX SOLR
CUTANEOUS | Status: DC | PRN
  Administered 2023-03-20: 20 mL via TOPICAL

## 2023-03-20 MED ORDER — OXYCODONE HCL 5 MG PO TABS
5.0000 mg | ORAL_TABLET | ORAL | Status: DC | PRN
  Filled 2023-03-20: qty 2

## 2023-03-20 MED ORDER — PHENYLEPHRINE HCL-NACL 20-0.9 MG/250ML-% IV SOLN
INTRAVENOUS | Status: DC | PRN
  Administered 2023-03-20: 25 ug/min via INTRAVENOUS

## 2023-03-20 MED ORDER — SUGAMMADEX SODIUM 200 MG/2ML IV SOLN
INTRAVENOUS | Status: DC | PRN
  Administered 2023-03-20: 200 mg via INTRAVENOUS

## 2023-03-20 MED ORDER — LIDOCAINE-EPINEPHRINE 1 %-1:100000 IJ SOLN
INTRAMUSCULAR | Status: AC
  Filled 2023-03-20: qty 1

## 2023-03-20 MED ORDER — ONDANSETRON HCL 4 MG/2ML IJ SOLN
4.0000 mg | Freq: Four times a day (QID) | INTRAMUSCULAR | Status: DC | PRN
Start: 2023-03-20 — End: 2023-03-20
  Administered 2023-03-20: 4 mg via INTRAVENOUS

## 2023-03-20 MED ORDER — 0.9 % SODIUM CHLORIDE (POUR BTL) OPTIME
TOPICAL | Status: DC | PRN
  Administered 2023-03-20: 2000 mL

## 2023-03-20 MED ORDER — SODIUM CHLORIDE 0.9 % IV BOLUS (SEPSIS)
2000.0000 mL | Freq: Once | INTRAVENOUS | Status: AC
  Administered 2023-03-20: 2000 mL via INTRAVENOUS

## 2023-03-20 MED ORDER — TETANUS-DIPHTH-ACELL PERTUSSIS 5-2.5-18.5 LF-MCG/0.5 IM SUSY
0.5000 mL | PREFILLED_SYRINGE | Freq: Once | INTRAMUSCULAR | Status: AC
  Administered 2023-03-20: 0.5 mL via INTRAMUSCULAR
  Filled 2023-03-20: qty 0.5

## 2023-03-20 MED ORDER — ONDANSETRON HCL 4 MG/2ML IJ SOLN: 4.0000 mg | Freq: Once | INTRAMUSCULAR | Status: DC | PRN

## 2023-03-20 MED ORDER — LABETALOL HCL 5 MG/ML IV SOLN
INTRAVENOUS | Status: AC
  Administered 2023-03-20: 5 mg via INTRAVENOUS
  Filled 2023-03-20: qty 4

## 2023-03-20 MED ORDER — ONDANSETRON 4 MG PO TBDP: 4.0000 mg | ORAL_TABLET | Freq: Four times a day (QID) | ORAL | Status: DC | PRN

## 2023-03-20 MED ORDER — LABETALOL HCL 5 MG/ML IV SOLN: 10.0000 mg | INTRAVENOUS | Status: DC | PRN

## 2023-03-20 MED ORDER — ACETAMINOPHEN 325 MG PO TABS: 650.0000 mg | ORAL_TABLET | Freq: Four times a day (QID) | ORAL | Status: DC | PRN

## 2023-03-20 MED ORDER — BACITRACIN ZINC 500 UNIT/GM EX OINT
TOPICAL_OINTMENT | CUTANEOUS | Status: AC
  Filled 2023-03-20: qty 28.35

## 2023-03-20 MED ORDER — PROMETHAZINE HCL 12.5 MG PO TABS: 12.5000 mg | ORAL_TABLET | ORAL | Status: DC | PRN

## 2023-03-20 MED ORDER — CEFAZOLIN SODIUM-DEXTROSE 2-4 GM/100ML-% IV SOLN
2.0000 g | Freq: Once | INTRAVENOUS | Status: AC
  Administered 2023-03-20: 2 g via INTRAVENOUS
  Filled 2023-03-20: qty 100

## 2023-03-20 MED ORDER — LIDOCAINE-EPINEPHRINE 1 %-1:100000 IJ SOLN
INTRAMUSCULAR | Status: DC | PRN
  Administered 2023-03-20: 10 mL

## 2023-03-20 MED ORDER — THROMBIN 20000 UNITS EX SOLR
CUTANEOUS | Status: AC
  Filled 2023-03-20: qty 20000

## 2023-03-20 MED ORDER — ORAL CARE MOUTH RINSE: 15.0000 mL | OROMUCOSAL | Status: DC | PRN

## 2023-03-20 MED ORDER — SODIUM CHLORIDE 0.9 % IV SOLN: INTRAVENOUS | Status: DC | PRN

## 2023-03-20 MED ORDER — FENTANYL CITRATE (PF) 100 MCG/2ML IJ SOLN
INTRAMUSCULAR | Status: DC | PRN
  Administered 2023-03-20: 50 ug via INTRAVENOUS
  Administered 2023-03-20: 150 ug via INTRAVENOUS

## 2023-03-20 MED ORDER — CEFAZOLIN SODIUM-DEXTROSE 2-4 GM/100ML-% IV SOLN
2.0000 g | Freq: Three times a day (TID) | INTRAVENOUS | Status: AC
  Administered 2023-03-20 (×2): 2 g via INTRAVENOUS
  Filled 2023-03-20 (×2): qty 100

## 2023-03-20 MED ORDER — LIDOCAINE HCL (CARDIAC) PF 100 MG/5ML IV SOSY
PREFILLED_SYRINGE | INTRAVENOUS | Status: DC | PRN
  Administered 2023-03-20: 100 mg via INTRATRACHEAL

## 2023-03-20 MED ORDER — ESMOLOL HCL 100 MG/10ML IV SOLN
INTRAVENOUS | Status: DC | PRN
  Administered 2023-03-20: 10 mg via INTRAVENOUS

## 2023-03-20 MED ORDER — DOCUSATE SODIUM 100 MG PO CAPS
100.0000 mg | ORAL_CAPSULE | Freq: Two times a day (BID) | ORAL | Status: DC
Start: 2023-03-20 — End: 2023-03-20

## 2023-03-20 MED ORDER — PANTOPRAZOLE SODIUM 40 MG IV SOLR: 40.0000 mg | Freq: Every day | INTRAVENOUS | Status: DC

## 2023-03-20 MED ORDER — HYDROMORPHONE HCL 1 MG/ML IJ SOLN
0.5000 mg | INTRAMUSCULAR | Status: DC | PRN
  Administered 2023-03-20 – 2023-03-21 (×4): 0.5 mg via INTRAVENOUS
  Filled 2023-03-20 (×4): qty 1

## 2023-03-20 MED ORDER — FENTANYL CITRATE (PF) 250 MCG/5ML IJ SOLN
INTRAMUSCULAR | Status: AC
  Filled 2023-03-20: qty 5

## 2023-03-20 MED ORDER — DOCUSATE SODIUM 100 MG PO CAPS: 100.0000 mg | ORAL_CAPSULE | Freq: Two times a day (BID) | ORAL | Status: DC

## 2023-03-20 MED ORDER — POTASSIUM CHLORIDE IN NACL 20-0.9 MEQ/L-% IV SOLN
INTRAVENOUS | Status: DC
  Filled 2023-03-20 (×2): qty 1000

## 2023-03-20 MED ORDER — LACTATED RINGERS IV SOLN: INTRAVENOUS | Status: DC | PRN

## 2023-03-20 MED ORDER — LEVETIRACETAM IN NACL 500 MG/100ML IV SOLN
500.0000 mg | Freq: Two times a day (BID) | INTRAVENOUS | Status: DC
  Administered 2023-03-20 – 2023-03-22 (×5): 500 mg via INTRAVENOUS
  Filled 2023-03-20 (×5): qty 100

## 2023-03-20 MED ORDER — SODIUM CHLORIDE 0.9 % IV SOLN: INTRAVENOUS | Status: DC

## 2023-03-20 MED ORDER — GLYCOPYRROLATE 0.2 MG/ML IJ SOLN
INTRAMUSCULAR | Status: DC | PRN
  Administered 2023-03-20: .2 mg via INTRAVENOUS

## 2023-03-20 MED ORDER — PROPOFOL 10 MG/ML IV BOLUS
INTRAVENOUS | Status: DC | PRN
  Administered 2023-03-20: 70 mg via INTRAVENOUS

## 2023-03-20 MED ORDER — POTASSIUM CHLORIDE 10 MEQ/100ML IV SOLN: 10.0000 meq | INTRAVENOUS | Status: AC

## 2023-03-20 MED ORDER — ROCURONIUM BROMIDE 100 MG/10ML IV SOLN
INTRAVENOUS | Status: DC | PRN
  Administered 2023-03-20: 50 mg via INTRAVENOUS

## 2023-03-20 MED ORDER — ALBUMIN HUMAN 5 % IV SOLN: INTRAVENOUS | Status: DC | PRN

## 2023-03-20 SURGICAL SUPPLY — 65 items
ADH SKN CLS APL DERMABOND .7 (GAUZE/BANDAGES/DRESSINGS) ×1
BAG COUNTER SPONGE SURGICOUNT (BAG) ×1 IMPLANT
BAG SPNG CNTER NS LX DISP (BAG) ×3
BIT DRILL WIRE PASS 1.3MM (BIT) IMPLANT
BLADE CLIPPER SURG (BLADE) IMPLANT
BLADE SURG 11 STRL SS (BLADE) ×1 IMPLANT
BNDG CMPR 75X41 PLY HI ABS (GAUZE/BANDAGES/DRESSINGS)
BNDG COHESIVE 4X5 TAN STRL (GAUZE/BANDAGES/DRESSINGS) IMPLANT
BNDG STRETCH 4X75 STRL LF (GAUZE/BANDAGES/DRESSINGS) IMPLANT
BUR ACORN 9.0 PRECISION (BURR) ×1 IMPLANT
BUR SPIRAL ROUTER 2.3 (BUR) ×1 IMPLANT
CANISTER SUCT 3000ML PPV (MISCELLANEOUS) ×1 IMPLANT
CLIP TI MEDIUM 6 (CLIP) IMPLANT
DERMABOND ADVANCED .7 DNX12 (GAUZE/BANDAGES/DRESSINGS) ×1 IMPLANT
DRAPE NEUROLOGICAL W/INCISE (DRAPES) ×1 IMPLANT
DRAPE SURG 17X23 STRL (DRAPES) IMPLANT
DRAPE WARM FLUID 44X44 (DRAPES) ×1 IMPLANT
DRILL WIRE PASS 1.3MM (BIT)
DRSG OPSITE POSTOP 4X6 (GAUZE/BANDAGES/DRESSINGS) IMPLANT
ELECT REM PT RETURN 9FT ADLT (ELECTROSURGICAL) ×1
ELECTRODE REM PT RTRN 9FT ADLT (ELECTROSURGICAL) ×1 IMPLANT
GAUZE 4X4 16PLY ~~LOC~~+RFID DBL (SPONGE) IMPLANT
GAUZE SPONGE 4X4 12PLY STRL (GAUZE/BANDAGES/DRESSINGS) IMPLANT
GLOVE BIO SURGEON STRL SZ7 (GLOVE) IMPLANT
GLOVE BIO SURGEON STRL SZ8 (GLOVE) ×1 IMPLANT
GLOVE BIOGEL PI IND STRL 7.0 (GLOVE) IMPLANT
GLOVE EXAM NITRILE XL STR (GLOVE) IMPLANT
GLOVE INDICATOR 8.5 STRL (GLOVE) ×2 IMPLANT
GOWN STRL REUS W/ TWL LRG LVL3 (GOWN DISPOSABLE) ×1 IMPLANT
GOWN STRL REUS W/ TWL XL LVL3 (GOWN DISPOSABLE) ×1 IMPLANT
GOWN STRL REUS W/TWL 2XL LVL3 (GOWN DISPOSABLE) IMPLANT
GOWN STRL REUS W/TWL LRG LVL3 (GOWN DISPOSABLE) ×1
GOWN STRL REUS W/TWL XL LVL3 (GOWN DISPOSABLE) ×1
GRAFT DURAGEN MATRIX 1WX1L (Tissue) IMPLANT
HEMOSTAT SURGICEL 2X14 (HEMOSTASIS) ×1 IMPLANT
KIT BASIN OR (CUSTOM PROCEDURE TRAY) ×1 IMPLANT
KIT TURNOVER KIT B (KITS) ×1 IMPLANT
NDL HYPO 25X1 1.5 SAFETY (NEEDLE) ×1 IMPLANT
NEEDLE HYPO 25X1 1.5 SAFETY (NEEDLE) ×1 IMPLANT
NS IRRIG 1000ML POUR BTL (IV SOLUTION) ×1 IMPLANT
PACK CRANIOTOMY CUSTOM (CUSTOM PROCEDURE TRAY) ×1 IMPLANT
PAD ARMBOARD 7.5X6 YLW CONV (MISCELLANEOUS) ×1 IMPLANT
PATTIES SURGICAL .25X.25 (GAUZE/BANDAGES/DRESSINGS) IMPLANT
PATTIES SURGICAL .5 X.5 (GAUZE/BANDAGES/DRESSINGS) IMPLANT
PATTIES SURGICAL .5 X3 (DISPOSABLE) IMPLANT
PATTIES SURGICAL 1X1 (DISPOSABLE) IMPLANT
PIN MAYFIELD SKULL DISP (PIN) IMPLANT
PLATE CRANIAL 20 W/TAB F/SHN (Plate) IMPLANT
SCREW UNIII AXS SD 1.5X4 (Screw) IMPLANT
SOL ELECTROSURG ANTI STICK (MISCELLANEOUS) ×1
SOLUTION ELECTROSURG ANTI STCK (MISCELLANEOUS) ×1 IMPLANT
SPIKE FLUID TRANSFER (MISCELLANEOUS) ×1 IMPLANT
SPONGE NEURO XRAY DETECT 1X3 (DISPOSABLE) IMPLANT
SPONGE SURGIFOAM ABS GEL 100 (HEMOSTASIS) IMPLANT
STAPLER VISISTAT 35W (STAPLE) ×1 IMPLANT
SUT ETHILON 3 0 FSL (SUTURE) IMPLANT
SUT NURALON 4 0 TR CR/8 (SUTURE) ×2 IMPLANT
SUT VIC AB 2-0 CP2 18 (SUTURE) IMPLANT
SUT VIC AB 2-0 CT1 18 (SUTURE) ×1 IMPLANT
TOWEL GREEN STERILE (TOWEL DISPOSABLE) ×1 IMPLANT
TOWEL GREEN STERILE FF (TOWEL DISPOSABLE) ×1 IMPLANT
TRAY FOLEY MTR SLVR 16FR STAT (SET/KITS/TRAYS/PACK) IMPLANT
TUBING FEATHERFLOW (TUBING) IMPLANT
UNDERPAD 30X36 HEAVY ABSORB (UNDERPADS AND DIAPERS) IMPLANT
WATER STERILE IRR 1000ML POUR (IV SOLUTION) ×1 IMPLANT
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
## 2023-03-20 NOTE — Care Management (Signed)
Patient mother picked up patients car key. This RN confirmed with the patient that it was okay for the patient's mother to take her car key.   [name] Dare, RN

## 2023-03-20 NOTE — Progress Notes (Signed)
Pt 2 visitors designated [address] and [address]. Pt and family were educated by TRN that these would be the only 2 visitors for Pt's entire hospital stay.

## 2023-03-20 NOTE — TOC CAGE-AID Note (Signed)
Transition of Care [facility]) - CAGE-AID Screening   Patient Details  Name: Travis Palmer MRN: [id number] Date of Birth: 1996/12/04  Transition of Care [facility]) CM/SW Contact:    Katha Hamming, RN Phone Number:[phone] 03/20/2023, 7:21 PM    CAGE-AID Screening: Substance Abuse Screening unable to be completed due to: : Patient Refused (refusing to participate in screening)

## 2023-03-20 NOTE — ED Notes (Signed)
DR. Freida Busman arrives to room
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
## 2023-03-20 NOTE — ED Notes (Signed)
Patient transported to CT 

## 2023-03-20 NOTE — Anesthesia Procedure Notes (Signed)
Arterial Line Insertion Start/End4/10/2023 6:45 AM, 03/20/2023 6:55 AM Performed by: Dorie Rank, CRNA, CRNA  Patient location: OR. Preanesthetic checklist: patient identified, IV checked, site marked, risks and benefits discussed, surgical consent, monitors and equipment checked, pre-op evaluation and timeout performed Right was placed Catheter size: 20 G Hand hygiene performed , maximum sterile barriers used  and Seldinger technique used Allen's test indicative of satisfactory collateral circulation Attempts: 1 Procedure performed without using ultrasound guided technique. Following insertion, dressing applied and Biopatch. Patient tolerated the procedure well with no immediate complications.

## 2023-03-20 NOTE — Progress Notes (Signed)
Trauma Event Note  TRN rounded on pt.  Pt is oriented to self but disoriented/uncooperative with other questions.    Pt's mother, Karoline Caldwell has been calling the hospital attempting to visit however there was an order from [facility] to not allow visitors.  This was clarified was [employer] and security.  Visitation is now allowed.  I have personally spoken with pt's mother and pt's great aunt.  They are to be the only 2 visitors throughout the entire hospital stay.  Password created and family updated.    Pt is moving all extremities and following commands consistently.   Last imported Vital Signs BP (!) 118/96   Pulse 75   Temp (!) 97.2 F (36.2 C)   Resp 15   Ht 6' (1.829 m)   Wt 220 lb (99.8 kg)   SpO2 97%   BMI 29.84 kg/m   Trending CBC Recent Labs    03/20/23 0453 03/20/23 0459 03/20/23 0722 03/20/23 1038  WBC 10.0  --   --  15.1*  HGB 13.6 14.6 12.6* 12.8*  HCT 40.2 43.0 37.0* 36.4*  PLT 291  --   --  264    Trending Coag's No results for input(s): "APTT", "INR" in the last 72 hours.  Trending BMET Recent Labs    03/20/23 0453 03/20/23 0459 03/20/23 0722 03/20/23 1038  NA 135 139 140 137  K 3.3* 3.4* 3.2* 3.3*  CL 103 102  --  104  CO2 20*  --   --  22  BUN 10 10  --  7  CREATININE 1.09 1.00  --  0.72  GLUCOSE 187* 187*  --  129*    Travis Palmer W  Trauma Response RN  Please call TRN at [phone] for further assistance.

## 2023-03-20 NOTE — Anesthesia Procedure Notes (Signed)
Procedure Name: Intubation Date/Time: 03/20/2023 6:58 AM  Performed by: Dorie Rank, CRNAPre-anesthesia Checklist: Patient identified, Emergency Drugs available, Suction available and Patient being monitored Patient Re-evaluated:Patient Re-evaluated prior to induction Oxygen Delivery Method: Circle System Utilized Preoxygenation: Pre-oxygenation with 100% oxygen Induction Type: IV induction Ventilation: Mask ventilation without difficulty Laryngoscope Size: Mac and 4 Grade View: Grade I Tube type: Oral Tube size: 8.0 mm Number of attempts: 1 Airway Equipment and Method: Stylet and Oral airway Placement Confirmation: ETT inserted through vocal cords under direct vision, positive ETCO2 and breath sounds checked- equal and bilateral Secured at: 25 cm Tube secured with: Tape Dental Injury: Teeth and Oropharynx as per pre-operative assessment  Comments: Intubated by Natividad Brood SRNA

## 2023-03-20 NOTE — ED Notes (Signed)
Consult to Neurosurgery
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
## 2023-03-20 NOTE — ED Provider Notes (Signed)
Patient will now be taken to the operating room with neurosurgery   Zadie Rhine, MD 03/20/23 [phone]
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
## 2023-03-20 NOTE — Care Management (Addendum)
This RN reached out to Trauma RN in regards to patients arterial line. Patient is consistently moving arm with arterial line so this RN is unable to get a correct BP reading to be able to treat off of. Trauma RN advised this RN to treat based off of cuff BP.   [name] Dare, RN    [phone]: This RN noticed arterial line was malpositional. This RN reached out to Trauma RN who agreed to go ahead and remove. This RN removed arterial line.   [name] Dare, RN
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
## 2023-03-20 NOTE — Progress Notes (Signed)
Orthopedic Tech Progress Note Patient Details:  Travis Palmer Aug 07, 1996 [id number]  Patient ID: Travis Palmer, male   DOB: [birth year], 27 y.o.   MRN: [id number] Level I; not currently needed. Darleen Crocker 03/20/2023, 5:02 AM

## 2023-03-21 ENCOUNTER — Inpatient Hospital Stay (HOSPITAL_COMMUNITY): Payer: Medicaid Other

## 2023-03-21 ENCOUNTER — Encounter (HOSPITAL_COMMUNITY): Payer: Self-pay | Admitting: Neurosurgery

## 2023-03-21 LAB — CBC
HCT: 38.2 % — ABNORMAL LOW (ref 39.0–52.0)
Hemoglobin: 12.9 g/dL — ABNORMAL LOW (ref 13.0–17.0)
MCH: 31.3 pg (ref 26.0–34.0)
MCHC: 33.8 g/dL (ref 30.0–36.0)
MCV: 92.7 fL (ref 80.0–100.0)
Platelets: 299 10*3/uL (ref 150–400)
RBC: 4.12 MIL/uL — ABNORMAL LOW (ref 4.22–5.81)
RDW: 12.4 % (ref 11.5–15.5)
WBC: 15.9 10*3/uL — ABNORMAL HIGH (ref 4.0–10.5)
nRBC: 0 % (ref 0.0–0.2)

## 2023-03-21 LAB — PHOSPHORUS: Phosphorus: 2.1 mg/dL — ABNORMAL LOW (ref 2.5–4.6)

## 2023-03-21 LAB — GLUCOSE, CAPILLARY
Glucose-Capillary: 105 mg/dL — ABNORMAL HIGH (ref 70–99)
Glucose-Capillary: 120 mg/dL — ABNORMAL HIGH (ref 70–99)

## 2023-03-21 LAB — BASIC METABOLIC PANEL
Anion gap: 9 (ref 5–15)
BUN: 6 mg/dL (ref 6–20)
CO2: 23 mmol/L (ref 22–32)
Calcium: 8.1 mg/dL — ABNORMAL LOW (ref 8.9–10.3)
Chloride: 101 mmol/L (ref 98–111)
Creatinine, Ser: 0.76 mg/dL (ref 0.61–1.24)
GFR, Estimated: >60 mL/min (ref >60)
Glucose, Bld: 118 mg/dL — ABNORMAL HIGH (ref 70–99)
Potassium: 3.5 mmol/L (ref 3.5–5.1)
Sodium: 133 mmol/L — ABNORMAL LOW (ref 135–145)

## 2023-03-21 LAB — MAGNESIUM: Magnesium: 2 mg/dL (ref 1.7–2.4)

## 2023-03-21 MED ORDER — PROMETHAZINE HCL 12.5 MG PO TABS: 12.5000 mg | ORAL_TABLET | ORAL | Status: DC | PRN

## 2023-03-21 MED ORDER — ACETAMINOPHEN 160 MG/5ML PO SOLN
1000.0000 mg | Freq: Four times a day (QID) | ORAL | Status: DC
  Filled 2023-03-21: qty 40.6

## 2023-03-21 MED ORDER — ACETAMINOPHEN 160 MG/5ML PO SOLN
1000.0000 mg | Freq: Four times a day (QID) | ORAL | Status: DC
  Administered 2023-03-21 – 2023-03-25 (×13): 1000 mg
  Filled 2023-03-21 (×12): qty 40.6

## 2023-03-21 MED ORDER — LORAZEPAM 2 MG/ML IJ SOLN
2.0000 mg | Freq: Once | INTRAMUSCULAR | Status: AC
  Administered 2023-03-21: 2 mg via INTRAVENOUS
  Filled 2023-03-21: qty 1

## 2023-03-21 MED ORDER — LORAZEPAM 1 MG PO TABS
1.0000 mg | ORAL_TABLET | Freq: Four times a day (QID) | ORAL | Status: AC
  Administered 2023-03-21 – 2023-03-22 (×2): 1 mg
  Filled 2023-03-21 (×2): qty 1

## 2023-03-21 MED ORDER — ZOLPIDEM TARTRATE 5 MG PO TABS: 5.0000 mg | ORAL_TABLET | Freq: Every day | ORAL | Status: DC

## 2023-03-21 MED ORDER — METHOCARBAMOL 500 MG PO TABS
1000.0000 mg | ORAL_TABLET | Freq: Three times a day (TID) | ORAL | Status: DC
  Administered 2023-03-21 – 2023-03-24 (×8): 1000 mg
  Filled 2023-03-21 (×8): qty 2

## 2023-03-21 MED ORDER — DOCUSATE SODIUM 50 MG/5ML PO LIQD
100.0000 mg | Freq: Two times a day (BID) | ORAL | Status: DC
  Administered 2023-03-21 – 2023-03-23 (×5): 100 mg
  Filled 2023-03-21 (×5): qty 10

## 2023-03-21 MED ORDER — OXYCODONE HCL 5 MG/5ML PO SOLN
5.0000 mg | ORAL | Status: DC | PRN
  Administered 2023-03-21: 10 mg via ORAL
  Filled 2023-03-21: qty 10

## 2023-03-21 MED ORDER — PIVOT 1.5 CAL PO LIQD
1000.0000 mL | ORAL | Status: DC
  Administered 2023-03-21 – 2023-03-22 (×2): 1000 mL

## 2023-03-21 MED ORDER — OXYCODONE HCL 5 MG/5ML PO SOLN
5.0000 mg | ORAL | Status: DC | PRN
  Administered 2023-03-22 – 2023-03-24 (×8): 10 mg
  Filled 2023-03-21 (×9): qty 10

## 2023-03-21 MED ORDER — LORAZEPAM 1 MG PO TABS
1.0000 mg | ORAL_TABLET | Freq: Four times a day (QID) | ORAL | Status: DC
  Administered 2023-03-21: 1 mg via ORAL
  Filled 2023-03-21: qty 1

## 2023-03-21 MED ORDER — METHOCARBAMOL 500 MG PO TABS
1000.0000 mg | ORAL_TABLET | Freq: Three times a day (TID) | ORAL | Status: DC
  Administered 2023-03-21: 1000 mg via ORAL
  Filled 2023-03-21: qty 2

## 2023-03-21 MED ORDER — HYDROMORPHONE HCL 1 MG/ML IJ SOLN
0.2500 mg | Freq: Three times a day (TID) | INTRAMUSCULAR | Status: DC | PRN
  Administered 2023-03-21 – 2023-03-24 (×4): 0.25 mg via INTRAVENOUS
  Filled 2023-03-21 (×4): qty 1

## 2023-03-21 MED ORDER — PHENOL 1.4 % MT LIQD: 1.0000 | OROMUCOSAL | Status: DC | PRN

## 2023-03-21 MED ORDER — ONDANSETRON HCL 4 MG/2ML IJ SOLN: 4.0000 mg | INTRAMUSCULAR | Status: DC | PRN

## 2023-03-21 MED ORDER — ZOLPIDEM TARTRATE 5 MG PO TABS
5.0000 mg | ORAL_TABLET | Freq: Every day | ORAL | Status: AC
  Administered 2023-03-21 – 2023-03-22 (×2): 5 mg
  Filled 2023-03-21 (×2): qty 1

## 2023-03-21 MED ORDER — ONDANSETRON HCL 4 MG PO TABS
4.0000 mg | ORAL_TABLET | ORAL | Status: DC | PRN
  Filled 2023-03-21: qty 1
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
## 2023-03-21 NOTE — Procedures (Signed)
Cortrak  Person Inserting Tube:  Jamecia Lerman T, RD Tube Type:  Cortrak - 43 inches Tube Size:  10 Tube Location:  Left nare Secured by: Bridle Technique Used to Measure Tube Placement:  Marking at nare/corner of mouth Cortrak Secured At:  74 cm   Cortrak Tube Team Note:  Consult received to place a Cortrak feeding tube.   X-ray is required, abdominal x-ray has been ordered by the Cortrak team. Please confirm tube placement before using the Cortrak tube.   If the tube becomes dislodged please keep the tube and contact the Cortrak team at [email or web address] for replacement.  If after hours and replacement cannot be delayed, place a NG tube and confirm placement with an abdominal x-ray.    Ioan Landini RD, LDN For contact information, refer to AMiON.   
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
## 2023-03-22 ENCOUNTER — Inpatient Hospital Stay (HOSPITAL_COMMUNITY): Payer: Medicaid Other

## 2023-03-22 DIAGNOSIS — F061 Catatonic disorder due to known physiological condition: Secondary | ICD-10-CM

## 2023-03-22 LAB — GLUCOSE, CAPILLARY
Glucose-Capillary: 103 mg/dL — ABNORMAL HIGH (ref 70–99)
Glucose-Capillary: 107 mg/dL — ABNORMAL HIGH (ref 70–99)
Glucose-Capillary: 114 mg/dL — ABNORMAL HIGH (ref 70–99)
Glucose-Capillary: 118 mg/dL — ABNORMAL HIGH (ref 70–99)
Glucose-Capillary: 132 mg/dL — ABNORMAL HIGH (ref 70–99)
Glucose-Capillary: 96 mg/dL (ref 70–99)

## 2023-03-22 LAB — BASIC METABOLIC PANEL
Anion gap: 11 (ref 5–15)
BUN: 9 mg/dL (ref 6–20)
CO2: 23 mmol/L (ref 22–32)
Calcium: 8.9 mg/dL (ref 8.9–10.3)
Chloride: 102 mmol/L (ref 98–111)
Creatinine, Ser: 0.67 mg/dL (ref 0.61–1.24)
GFR, Estimated: >60 mL/min (ref >60)
Glucose, Bld: 119 mg/dL — ABNORMAL HIGH (ref 70–99)
Potassium: 4.2 mmol/L (ref 3.5–5.1)
Sodium: 136 mmol/L (ref 135–145)

## 2023-03-22 LAB — MAGNESIUM
Magnesium: 2.3 mg/dL (ref 1.7–2.4)
Magnesium: 2.1 mg/dL (ref 1.7–2.4)

## 2023-03-22 LAB — PHOSPHORUS
Phosphorus: 1.9 mg/dL — ABNORMAL LOW (ref 2.5–4.6)
Phosphorus: 2.4 mg/dL — ABNORMAL LOW (ref 2.5–4.6)

## 2023-03-22 MED ORDER — LEVETIRACETAM 500 MG PO TABS
500.0000 mg | ORAL_TABLET | Freq: Two times a day (BID) | ORAL | Status: DC
  Administered 2023-03-22 – 2023-03-23 (×3): 500 mg
  Filled 2023-03-22 (×3): qty 1

## 2023-03-22 MED ORDER — POTASSIUM & SODIUM PHOSPHATES 280-160-250 MG PO PACK
2.0000 | PACK | Freq: Two times a day (BID) | ORAL | Status: DC
  Administered 2023-03-22 (×2): 2
  Filled 2023-03-22 (×2): qty 2

## 2023-03-22 MED ORDER — IOHEXOL 350 MG/ML SOLN
75.0000 mL | Freq: Once | INTRAVENOUS | Status: AC | PRN
  Administered 2023-03-22: 75 mL via INTRAVENOUS

## 2023-03-22 MED ORDER — ORAL CARE MOUTH RINSE
15.0000 mL | OROMUCOSAL | Status: DC
  Administered 2023-03-22 – 2023-03-25 (×11): 15 mL via OROMUCOSAL

## 2023-03-22 MED ORDER — ORAL CARE MOUTH RINSE: 15.0000 mL | OROMUCOSAL | Status: DC | PRN
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
## 2023-03-22 NOTE — Progress Notes (Signed)
Inpatient Rehab Admissions Coordinator:  ? ?Per therapy recommendations,  patient was screened for CIR candidacy by Danielys Madry, MS, CCC-SLP. At this time, Pt. Appears to be a a potential candidate for CIR. I will place   order for rehab consult per protocol for full assessment. Please contact me any with questions. ? ?Marialy Urbanczyk, MS, CCC-SLP ?Rehab Admissions Coordinator  ?[phone] (celll) ?[phone] (office) ? ?
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
## 2023-03-23 LAB — GLUCOSE, CAPILLARY
Glucose-Capillary: 122 mg/dL — ABNORMAL HIGH (ref 70–99)
Glucose-Capillary: 121 mg/dL — ABNORMAL HIGH (ref 70–99)
Glucose-Capillary: 109 mg/dL — ABNORMAL HIGH (ref 70–99)
Glucose-Capillary: 119 mg/dL — ABNORMAL HIGH (ref 70–99)
Glucose-Capillary: 115 mg/dL — ABNORMAL HIGH (ref 70–99)
Glucose-Capillary: 143 mg/dL — ABNORMAL HIGH (ref 70–99)

## 2023-03-23 LAB — BASIC METABOLIC PANEL
Anion gap: 10 (ref 5–15)
BUN: 13 mg/dL (ref 6–20)
CO2: 25 mmol/L (ref 22–32)
Calcium: 8.9 mg/dL (ref 8.9–10.3)
Chloride: 98 mmol/L (ref 98–111)
Creatinine, Ser: 0.65 mg/dL (ref 0.61–1.24)
GFR, Estimated: >60 mL/min (ref >60)
Glucose, Bld: 116 mg/dL — ABNORMAL HIGH (ref 70–99)
Potassium: 4.1 mmol/L (ref 3.5–5.1)
Sodium: 133 mmol/L — ABNORMAL LOW (ref 135–145)

## 2023-03-23 LAB — PHOSPHORUS: Phosphorus: 3.4 mg/dL (ref 2.5–4.6)

## 2023-03-23 LAB — MAGNESIUM: Magnesium: 2.1 mg/dL (ref 1.7–2.4)

## 2023-03-23 MED ORDER — LORAZEPAM 2 MG/ML IJ SOLN
0.5000 mg | Freq: Once | INTRAMUSCULAR | Status: AC
  Administered 2023-03-23: 0.5 mg via INTRAVENOUS
  Filled 2023-03-23: qty 1

## 2023-03-23 MED ORDER — QUETIAPINE FUMARATE 25 MG PO TABS
25.0000 mg | ORAL_TABLET | Freq: Two times a day (BID) | ORAL | Status: DC
  Administered 2023-03-23 (×2): 25 mg
  Filled 2023-03-23 (×2): qty 1

## 2023-03-23 NOTE — Progress Notes (Signed)
Patient removed PIV in the left [facility] in addition to removing monitoring equipment. This RN observed patient pull out PIV and leads via camera monitor, but the patient claims that they did not remove PIV or monitoring equipment.   Site was cleaned and bandaged. Mittens were applied to patient. Patient remains restless and mumbles answers to questions after repetitive questioning.   03/23/2023 Oralia Manis, RN 1:11 PM
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
## 2023-03-23 NOTE — Progress Notes (Signed)
NEUROSURGERY PROGRESS NOTE  Doing well. Has been waxing and waning with her speech.  Ok to transfer to floor today from neurosurgery perspective.  Temp:  [96.7 F (35.9 C)-99.2 F (37.3 C)] 98.2 F (36.8 C) (04/14 0800) Pulse Rate:  [57-73] 61 (04/14 0800) Resp:  [10-22] 12 (04/14 0800) BP: (128-151)/(83-107) 140/100 (04/14 0800) SpO2:  [94 %-100 %] 100 % (04/14 0800)    Sherryl Manges, NP 03/23/2023 9:24 AM

## 2023-03-23 NOTE — Progress Notes (Signed)
Patient keeps removing EKG leads. Instructed patient not to remove leads as we are monitoring her. Educated patient on what we are monitoring her for. Patient told this RN she wants to go home. This RN expressed understanding and attempted to soothe patient. Patient remained agitated with RN. RN reapplied leads and instructed patient to not remove monitoring equipment.   03/23/2023 Oralia Manis, RN 10:45 AM
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
## 2023-03-24 DIAGNOSIS — W3400XA Accidental discharge from unspecified firearms or gun, initial encounter: Secondary | ICD-10-CM

## 2023-03-24 DIAGNOSIS — R4586 Emotional lability: Secondary | ICD-10-CM

## 2023-03-24 DIAGNOSIS — S020XXK Fracture of vault of skull, subsequent encounter for fracture with nonunion: Secondary | ICD-10-CM

## 2023-03-24 DIAGNOSIS — S069X9D Unspecified intracranial injury with loss of consciousness of unspecified duration, subsequent encounter: Secondary | ICD-10-CM

## 2023-03-24 LAB — BPAM RBC
Blood Product Expiration Date: 202405032359
Blood Product Expiration Date: 202405032359
Blood Product Expiration Date: 202405072359
Unit Type and Rh: 5100
Unit Type and Rh: 5100
Unit Type and Rh: 6200
Unit Type and Rh: 6200

## 2023-03-24 LAB — TYPE AND SCREEN
ABO/RH(D): A POS
Antibody Screen: NEGATIVE
Unit division: 0
Unit division: 0

## 2023-03-24 LAB — GLUCOSE, CAPILLARY
Glucose-Capillary: 118 mg/dL — ABNORMAL HIGH (ref 70–99)
Glucose-Capillary: 126 mg/dL — ABNORMAL HIGH (ref 70–99)
Glucose-Capillary: 143 mg/dL — ABNORMAL HIGH (ref 70–99)
Glucose-Capillary: 138 mg/dL — ABNORMAL HIGH (ref 70–99)

## 2023-03-24 MED ORDER — ENSURE ENLIVE PO LIQD
237.0000 mL | Freq: Two times a day (BID) | ORAL | Status: DC
  Administered 2023-03-25 (×2): 237 mL via ORAL

## 2023-03-24 MED ORDER — LEVETIRACETAM 500 MG PO TABS: 500.0000 mg | ORAL_TABLET | Freq: Two times a day (BID) | ORAL | Status: DC

## 2023-03-24 MED ORDER — QUETIAPINE FUMARATE 50 MG PO TABS
25.0000 mg | ORAL_TABLET | Freq: Two times a day (BID) | ORAL | Status: DC
  Administered 2023-03-24 – 2023-03-25 (×3): 25 mg
  Filled 2023-03-24 (×3): qty 1

## 2023-03-24 MED ORDER — FREE WATER
50.0000 mL | Freq: Every day | Status: DC
  Administered 2023-03-25: 50 mL

## 2023-03-24 MED ORDER — FREE WATER
50.0000 mL | Freq: Every day | Status: DC
  Administered 2023-03-24: 50 mL

## 2023-03-24 MED ORDER — ENOXAPARIN SODIUM 30 MG/0.3ML IJ SOSY
30.0000 mg | PREFILLED_SYRINGE | Freq: Two times a day (BID) | INTRAMUSCULAR | Status: DC
  Administered 2023-03-24 – 2023-03-26 (×5): 30 mg via SUBCUTANEOUS
  Filled 2023-03-24 (×5): qty 0.3

## 2023-03-24 MED ORDER — METHOCARBAMOL 500 MG PO TABS
1000.0000 mg | ORAL_TABLET | Freq: Three times a day (TID) | ORAL | Status: DC
  Administered 2023-03-24 – 2023-03-25 (×3): 1000 mg
  Filled 2023-03-24 (×3): qty 2

## 2023-03-24 MED ORDER — DOCUSATE SODIUM 100 MG PO CAPS: 100.0000 mg | ORAL_CAPSULE | Freq: Two times a day (BID) | ORAL | Status: DC

## 2023-03-24 MED ORDER — LEVETIRACETAM 500 MG PO TABS
500.0000 mg | ORAL_TABLET | Freq: Two times a day (BID) | ORAL | Status: DC
  Administered 2023-03-24 – 2023-03-25 (×3): 500 mg
  Filled 2023-03-24 (×3): qty 1

## 2023-03-24 MED ORDER — PIVOT 1.5 CAL PO LIQD
1000.0000 mL | ORAL | Status: DC
  Filled 2023-03-24 (×2): qty 1000

## 2023-03-24 MED ORDER — SENNA 8.6 MG PO TABS
2.0000 | ORAL_TABLET | Freq: Once | ORAL | Status: AC
  Administered 2023-03-24: 17.2 mg via ORAL
  Filled 2023-03-24: qty 2

## 2023-03-24 MED ORDER — QUETIAPINE FUMARATE 25 MG PO TABS: 25.0000 mg | ORAL_TABLET | Freq: Two times a day (BID) | ORAL | Status: DC

## 2023-03-24 MED ORDER — DOCUSATE SODIUM 50 MG/5ML PO LIQD
100.0000 mg | Freq: Two times a day (BID) | ORAL | Status: DC
  Administered 2023-03-24 – 2023-03-25 (×2): 100 mg
  Filled 2023-03-24 (×2): qty 10

## 2023-03-24 MED ORDER — OXYCODONE HCL 5 MG PO TABS
5.0000 mg | ORAL_TABLET | ORAL | Status: DC | PRN
  Administered 2023-03-24 – 2023-03-26 (×6): 10 mg via ORAL
  Filled 2023-03-24 (×6): qty 2

## 2023-03-24 MED ORDER — MAGNESIUM HYDROXIDE 400 MG/5ML PO SUSP
30.0000 mL | Freq: Once | ORAL | Status: AC
  Administered 2023-03-24: 30 mL via ORAL
  Filled 2023-03-24: qty 30

## 2023-03-24 MED ORDER — PIVOT 1.5 CAL PO LIQD
1440.0000 mL | ORAL | Status: DC
  Administered 2023-03-24 – 2023-03-25 (×2): 1440 mL
  Filled 2023-03-24 (×4): qty 2000

## 2023-03-24 MED ORDER — MAGNESIUM CITRATE PO SOLN
1.0000 | Freq: Once | ORAL | Status: AC
  Administered 2023-03-24: 1 via ORAL
  Filled 2023-03-24: qty 296

## 2023-03-24 MED ORDER — POLYETHYLENE GLYCOL 3350 17 G PO PACK
17.0000 g | PACK | Freq: Every day | ORAL | Status: DC
  Administered 2023-03-24 – 2023-03-25 (×2): 17 g via ORAL
  Filled 2023-03-24 (×2): qty 1

## 2023-03-24 MED ORDER — METHOCARBAMOL 500 MG PO TABS: 1000.0000 mg | ORAL_TABLET | Freq: Three times a day (TID) | ORAL | Status: DC

## 2023-03-24 NOTE — Progress Notes (Signed)
Patient pulled out her IV.  This has been an ongoing issue.  Nurse explained to patient that in order to receive her full scope of medication we would need to maintain IV access.  Patient expressed understanding but was not willing to have another IV placed.  Patient was made aware she would not be able to receive her IV pain medication without an IV.  Patient expressed understanding.  Physician notified and agreed to continue care without IV access.  Patient is A/O x 4 and able to express risk of not having an IV.
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
## 2023-03-24 NOTE — Progress Notes (Signed)
Inpatient Rehab Admissions Coordinator:   Met with pt to discuss CIR recommendations.  Pt does not engage with me except for 1 head nod when I asked if she'd had a long day.  No verbalizing during my visit.  Will try to f/u with her tomorrow to discuss again, perhaps when working with therapy.   Estill Dooms, PT, DPT Admissions Coordinator [phone] 03/24/23  2:56 PM

## 2023-03-24 NOTE — Progress Notes (Signed)
RN was notified patient bed alarm was going off. RN walked into patients room with patient standing out of bed swaying back and forth with TF disconnected. RN tried to assist patient but patient stated to RN that " I dont need help and I will not fall". At time RN tried to educate patient on the importance of using the call light if needing help to bathroom or other assistance. Patient just rolled eyes and walked into bathroom. While using the bathroom RN saw patient squat down to clean something off of floor. This RN told patient RN will clean whatever is on the floor and to not squat down as it could lead to a fall or hitting head. Patient then told RN that they had been their for 2 days and has not fallen. RN again tried educating patient however, patient seemed displeased with the conversation this RN was trying to have with patient.
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
## 2023-03-25 LAB — GLUCOSE, CAPILLARY
Glucose-Capillary: 134 mg/dL — ABNORMAL HIGH (ref 70–99)
Glucose-Capillary: 106 mg/dL — ABNORMAL HIGH (ref 70–99)
Glucose-Capillary: 116 mg/dL — ABNORMAL HIGH (ref 70–99)

## 2023-03-25 MED ORDER — ONDANSETRON HCL 4 MG/2ML IJ SOLN: 4.0000 mg | INTRAMUSCULAR | Status: DC | PRN

## 2023-03-25 MED ORDER — LORAZEPAM 1 MG PO TABS
1.0000 mg | ORAL_TABLET | Freq: Three times a day (TID) | ORAL | Status: DC | PRN
  Administered 2023-03-25: 1 mg via ORAL
  Filled 2023-03-25: qty 1

## 2023-03-25 MED ORDER — METHOCARBAMOL 500 MG PO TABS
1000.0000 mg | ORAL_TABLET | Freq: Three times a day (TID) | ORAL | Status: DC
  Administered 2023-03-25 – 2023-03-26 (×3): 1000 mg via ORAL
  Filled 2023-03-25 (×3): qty 2

## 2023-03-25 MED ORDER — LEVETIRACETAM 500 MG PO TABS
500.0000 mg | ORAL_TABLET | Freq: Two times a day (BID) | ORAL | Status: DC
  Administered 2023-03-25 – 2023-03-26 (×2): 500 mg via ORAL
  Filled 2023-03-25 (×2): qty 1

## 2023-03-25 MED ORDER — DOCUSATE SODIUM 50 MG/5ML PO LIQD
100.0000 mg | Freq: Two times a day (BID) | ORAL | Status: DC
  Filled 2023-03-25 (×2): qty 10

## 2023-03-25 MED ORDER — ONDANSETRON HCL 4 MG PO TABS: 4.0000 mg | ORAL_TABLET | ORAL | Status: DC | PRN

## 2023-03-25 MED ORDER — PROMETHAZINE HCL 12.5 MG PO TABS
12.5000 mg | ORAL_TABLET | ORAL | Status: DC | PRN
  Filled 2023-03-25: qty 2

## 2023-03-25 MED ORDER — QUETIAPINE FUMARATE 50 MG PO TABS: 50.0000 mg | ORAL_TABLET | Freq: Every day | ORAL | Status: DC

## 2023-03-25 MED ORDER — ACETAMINOPHEN 160 MG/5ML PO SOLN
1000.0000 mg | Freq: Four times a day (QID) | ORAL | Status: DC
  Administered 2023-03-26 (×2): 1000 mg via ORAL
  Filled 2023-03-25 (×2): qty 40.6

## 2023-03-25 MED ORDER — VALPROATE SODIUM 100 MG/ML IV SOLN
250.0000 mg | Freq: Two times a day (BID) | INTRAVENOUS | Status: DC
  Filled 2023-03-25 (×3): qty 2.5
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
## 2023-03-25 NOTE — Progress Notes (Signed)
When RN went into patients room due to bed alarm going off. RN noticed patients tube feed was disconnected again. Patient stated they didn't want to be hooked up to anything anymore and "why does the alarm have to go off every time patient wants to get up". RN educated patient that the alarm is on for safety reasons but if the patient would like to get up she would need to use call bell so staffing can assist. RN then unhooked patient from monitoring  to assist to bathroom. Patient then goes sits down in a chair by the sink. RN asked patient if needed to use bathroom if not this RN will need to hook patient back up to monitor and to TF again. After explaining multiple times by this RN patient still not being compliant with this nurse. This RN got charge nurse and tech to help put patient on a portable monitor for telemetry. This RN was told by tech and Charge, patient did go back into bed, bed alarm on and call light in reach. Charge nurse also will be giving patient 0500/0600 medication since patient is not being compliant with this RN.   Patient still currently refusing tube feeds. Patient stated to RN "They do not need it", referring to tube feeds. RN notified TRN and MD.

## 2023-03-25 NOTE — Progress Notes (Signed)
This Clinical research associate was informed by the primary nurse that pt disconnected self from the TF, and tele. Pt walking around the room, and non-compliance, refusing all medical care. Writer went in with Lea (NT), to talk with pt. Writer explained to pt that bed alarm is required so as to prevent falls. Pt insisted that he did not want bed alarm, corktract, TF and even tele. Portable monitor placed on pt. Corktract remains clamped.
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
## 2023-03-25 NOTE — Progress Notes (Addendum)
This nurse paged PA today because patient is being non compliant with tele, tube feedings, bed alarms and taking Depakote. PA did send me a message that MD is aware.

## 2023-03-25 NOTE — Progress Notes (Signed)
Patient requesting to leave AMA, but refusing to communicate to determine capacity for decision-making. Mother and aunt at bedside and we discussed the adjustment in dispo from CIR to home and reviewed PT recs. Advised that if patient eats well and we can confirm a safe dispo, home 4/17 may be possible. Mother and aunt to assist in determining if adequate support can be provided after discharge.   Diamantina Monks, MD General and Trauma Surgery [facility]

## 2023-03-25 NOTE — Progress Notes (Signed)
Spoke with pharmacy and psyche and they are aware that patient is now allergic to Depakote.
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
## 2023-03-25 NOTE — Progress Notes (Signed)
Inpatient Rehab Admissions Coordinator:   Note updated therapy recommendations.  Will sign off for CIR.   Estill Dooms, PT, DPT Admissions Coordinator [phone] 03/25/23  1:09 PM

## 2023-03-25 NOTE — Progress Notes (Signed)
Patient ID: Travis Palmer, adult   DOB: 1996/01/12, 27 y.o.   MRN: [id number] Patient remains nonverbal this morning would not follow any commands but would track in regard and apparently is moving all extremities well spontaneously vision clean dry and intact

## 2023-03-26 ENCOUNTER — Inpatient Hospital Stay (HOSPITAL_COMMUNITY): Payer: Medicaid Other

## 2023-03-26 ENCOUNTER — Other Ambulatory Visit (HOSPITAL_COMMUNITY): Payer: Self-pay

## 2023-03-26 MED ORDER — OXCARBAZEPINE 150 MG PO TABS
150.0000 mg | ORAL_TABLET | Freq: Two times a day (BID) | ORAL | 0 refills | Status: DC
  Filled 2023-03-26: qty 60, 30d supply, fill #0

## 2023-03-26 MED ORDER — LEVETIRACETAM 500 MG PO TABS
500.0000 mg | ORAL_TABLET | Freq: Two times a day (BID) | ORAL | 0 refills | Status: DC
  Filled 2023-03-26: qty 2, 1d supply, fill #0

## 2023-03-26 MED ORDER — OXCARBAZEPINE 150 MG PO TABS
150.0000 mg | ORAL_TABLET | Freq: Two times a day (BID) | ORAL | Status: DC
  Administered 2023-03-26: 150 mg via ORAL
  Filled 2023-03-26 (×2): qty 1

## 2023-03-26 MED ORDER — QUETIAPINE FUMARATE 50 MG PO TABS
50.0000 mg | ORAL_TABLET | Freq: Every day | ORAL | 0 refills | Status: DC
  Filled 2023-03-26: qty 30, 30d supply, fill #0

## 2023-03-26 MED ORDER — POLYETHYLENE GLYCOL 3350 17 G PO PACK: 17.0000 g | PACK | Freq: Every day | ORAL | 0 refills | Status: DC | PRN

## 2023-03-26 MED ORDER — OXYCODONE HCL 5 MG PO TABS
5.0000 mg | ORAL_TABLET | Freq: Four times a day (QID) | ORAL | 0 refills | Status: DC | PRN
  Filled 2023-03-26: qty 15, 2d supply, fill #0

## 2023-03-26 MED ORDER — METHOCARBAMOL 500 MG PO TABS
1000.0000 mg | ORAL_TABLET | Freq: Three times a day (TID) | ORAL | 0 refills | Status: AC | PRN
  Filled 2023-03-26: qty 60, 10d supply, fill #0

## 2023-03-26 MED ORDER — ACETAMINOPHEN 500 MG PO TABS
1000.0000 mg | ORAL_TABLET | Freq: Four times a day (QID) | ORAL | 2 refills | Status: DC | PRN
  Filled 2023-03-26: qty 100, 13d supply, fill #0

## 2023-03-26 MED ORDER — DOCUSATE SODIUM 50 MG/5ML PO LIQD
100.0000 mg | Freq: Two times a day (BID) | ORAL | 0 refills | Status: DC
  Filled 2023-03-26: qty 100, 5d supply, fill #0
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
## 2023-03-26 NOTE — Progress Notes (Signed)
Patient asked RN if the patient had their wallet upon arrival to the hospital.    RN reviewed chart documentation in Epic.  Only patient belonging noted upon arrival were the patient's car keys.   Per Epic documentation, patient's car keys were sent home with patient's mother Mamie Diiorio.  Patient's physical chart checked to see if any belongings in security, no documentation in patient's physical chart of any belongings being sent to security.   Checked patient's room for any belongings, no wallet noted at bedside.   Advised patient that per documentation, no wallet was noted as being present with patient upon arrival to the hospital.
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
## 2023-03-26 NOTE — TOC Transition Note (Addendum)
Transition of Care [facility]) - CM/SW Discharge Note   Patient Details  Name: Travis Palmer MRN: [id number] Date of Birth: [birth year]  [facility] CM/SW Contact:  Glennon Mac, RN Phone Number: 03/26/2023, 2:56 PM   Clinical Narrative:  Patient medically stable for discharge home today with mother and grandmother to provide assistance. PT/OT and ST now recommending outpatient therapies, and patient agreeable to follow up.  She states that she has transportation to follow up appts.  Referrals made to [facility] for OP PT/OT and ST follow up. No DME needed or recommended. Pt is uninsured, but is eligible for medication assistance through [facility] program. Patient's discharge Rx sent to [facility] pharmacy to be filled using MATCH letter.    Addendum: 1527  Unable to reach patient's mother to arrange transportation home.  Voicemail box is full, so unable to leave message.  Spoke with Travis Palmer; she states mother works third shift, and is probably sleeping.  She states that her grandmother doesn't drive.  Travis Palmer states she will try to call family members to arrange transportation home; nurse and [facility] Case Manager will continue attempts to reach family as well.    Addendum 3:36pm Mother called back; states she will come to hospital to transport patient home before she goes to work at [employer].                       Discharge Plan and Services Additional resources added to the After Visit Summary for     Discharge Planning Services: CM Consult                                 Social Determinants of Health (SDOH) Interventions SDOH Screenings   Tobacco Use: Medium Risk (03/21/2023)     Readmission Risk Interventions     No data to display         Quintella Baton, RN, BSN  Trauma/Neuro ICU Case Manager [phone]

## 2023-03-26 NOTE — Progress Notes (Signed)
Patient continues to refuse cardiac monitoring and bed alarm for safety.  Patient educated on the need for compliance with doctor's orders and hospital protocol.  [name] alert and oriented x4 with delayed responses that seem to be intentional and not related to cognitive delay she verbalizes understanding but continues to refuse these interventions after several attempts to educate her this shift.
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
## 2023-03-27 ENCOUNTER — Encounter: Payer: Self-pay | Admitting: Infectious Disease

## 2023-03-31 ENCOUNTER — Encounter: Payer: Self-pay | Admitting: Physical Medicine & Rehabilitation

## 2023-04-03 ENCOUNTER — Encounter: Payer: Self-pay | Admitting: Physical Therapy

## 2023-04-03 ENCOUNTER — Ambulatory Visit: Payer: Medicaid Other | Admitting: Speech Pathology

## 2023-04-03 ENCOUNTER — Other Ambulatory Visit: Payer: Self-pay

## 2023-04-03 ENCOUNTER — Ambulatory Visit: Payer: Medicaid Other | Admitting: Physical Therapy

## 2023-04-03 ENCOUNTER — Encounter: Payer: Self-pay | Admitting: Speech Pathology

## 2023-04-03 ENCOUNTER — Ambulatory Visit: Payer: Medicaid Other | Attending: General Surgery | Admitting: Speech Pathology

## 2023-04-03 VITALS — BP 117/76 | HR 80

## 2023-04-03 DIAGNOSIS — R2681 Unsteadiness on feet: Secondary | ICD-10-CM | POA: Insufficient documentation

## 2023-04-03 DIAGNOSIS — W3400XA Accidental discharge from unspecified firearms or gun, initial encounter: Secondary | ICD-10-CM | POA: Diagnosis not present

## 2023-04-03 DIAGNOSIS — R41841 Cognitive communication deficit: Secondary | ICD-10-CM | POA: Insufficient documentation

## 2023-04-03 DIAGNOSIS — S0184XA Puncture wound with foreign body of other part of head, initial encounter: Secondary | ICD-10-CM | POA: Insufficient documentation

## 2023-04-03 DIAGNOSIS — M6281 Muscle weakness (generalized): Secondary | ICD-10-CM | POA: Diagnosis not present

## 2023-04-03 DIAGNOSIS — S0183XA Puncture wound without foreign body of other part of head, initial encounter: Secondary | ICD-10-CM | POA: Diagnosis present
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
## 2023-04-04 ENCOUNTER — Other Ambulatory Visit: Payer: Self-pay

## 2023-05-07 ENCOUNTER — Encounter: Payer: Self-pay | Attending: Physical Medicine & Rehabilitation | Admitting: Physical Medicine & Rehabilitation

## 2023-05-07 ENCOUNTER — Telehealth: Payer: Self-pay | Admitting: Pharmacist

## 2023-05-07 NOTE — Telephone Encounter (Signed)
Patient's medication [name]) has been delivered to [facility] from [facility] and will be administered at patient's next office visit on 05/13/23.  Raekwan Spelman L. Heru Montz, PharmD [facility] Clinical Pharmacist Practitioner

## 2023-05-13 ENCOUNTER — Ambulatory Visit: Payer: Self-pay | Admitting: Infectious Disease

## 2023-05-23 ENCOUNTER — Ambulatory Visit (INDEPENDENT_AMBULATORY_CARE_PROVIDER_SITE_OTHER): Payer: Medicaid Other | Admitting: Family

## 2023-05-23 ENCOUNTER — Encounter: Payer: Self-pay | Admitting: Family

## 2023-05-23 ENCOUNTER — Other Ambulatory Visit: Payer: Self-pay

## 2023-05-23 VITALS — BP 114/80 | HR 80 | Temp 97.9°F | Ht 68.0 in | Wt 208.0 lb

## 2023-05-23 DIAGNOSIS — B20 Human immunodeficiency virus [HIV] disease: Secondary | ICD-10-CM

## 2023-05-23 DIAGNOSIS — Z0000 Encounter for general adult medical examination without abnormal findings: Secondary | ICD-10-CM

## 2023-05-23 DIAGNOSIS — Z789 Other specified health status: Secondary | ICD-10-CM

## 2023-05-23 DIAGNOSIS — Z113 Encounter for screening for infections with a predominantly sexual mode of transmission: Secondary | ICD-10-CM | POA: Diagnosis not present

## 2023-05-23 MED ORDER — CABOTEGRAVIR & RILPIVIRINE ER 600 & 900 MG/3ML IM SUER
1.0000 | Freq: Once | INTRAMUSCULAR | Status: AC
Start: 2023-05-23 — End: 2023-05-23
  Administered 2023-05-23: 1 via INTRAMUSCULAR

## 2023-05-23 NOTE — Assessment & Plan Note (Addendum)
Travis Palmer continues to have well controlled virus with good adherence and tolerance to Cabevnua. [name] not actively engaged in her visit today focused on her phone. Reviewed lab work and discussed plan of care and U equals U. Check lab work. Injection of Cabenuva provided without complication. Will need to apply for Medicaid. Plan for follow up with pharmacy provider in 2 months and Dr. Daiva Eves in 4 months.
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
## 2023-05-23 NOTE — Assessment & Plan Note (Signed)
   Discussed importance of safe sexual practice and condom use. Condoms and STD testing offered.   Declines vaccinations.  Due for routine dental care.  

## 2023-05-23 NOTE — Patient Instructions (Addendum)
Nice to see you.  We will check your lab work today.  Continue to take your medication daily as prescribed.  Plan for follow up in 2 months with the Pharmacy providers and Dr. Daiva Eves in 4 months.   Have a great day and stay safe!

## 2023-05-23 NOTE — Assessment & Plan Note (Signed)
[name] appears stable with current dose of estradiol injection and spironolactone. Check lab work and continue current dose of estradiol and spironolactone pending lab work results.

## 2023-05-24 LAB — CT/NG RNA, TMA RECTAL
Chlamydia Trachomatis RNA: NOT DETECTED
Neisseria Gonorrhoeae RNA: NOT DETECTED

## 2023-05-24 LAB — GC/CHLAMYDIA PROBE, AMP (THROAT)
Chlamydia trachomatis RNA: NOT DETECTED
Neisseria gonorrhoeae RNA: NOT DETECTED

## 2023-05-26 LAB — COMPLETE METABOLIC PANEL WITHOUT GFR
AG Ratio: 1.8 (calc) (ref 1.0–2.5)
ALT: 12 U/L (ref 9–46)
AST: 12 U/L (ref 10–40)
Albumin: 4.6 g/dL (ref 3.6–5.1)
Alkaline phosphatase (APISO): 58 U/L (ref 36–130)
BUN: 11 mg/dL (ref 7–25)
CO2: 28 mmol/L (ref 20–32)
Calcium: 9.7 mg/dL (ref 8.6–10.3)
Chloride: 105 mmol/L (ref 98–110)
Creat: 0.88 mg/dL (ref 0.60–1.24)
Globulin: 2.6 g/dL (ref 1.9–3.7)
Glucose, Bld: 99 mg/dL (ref 65–99)
Potassium: 3.9 mmol/L (ref 3.5–5.3)
Sodium: 139 mmol/L (ref 135–146)
Total Bilirubin: 0.9 mg/dL (ref 0.2–1.2)
Total Protein: 7.2 g/dL (ref 6.1–8.1)
eGFR: 120 mL/min/1.73m2

## 2023-05-26 LAB — RPR: RPR Ser Ql: NONREACTIVE

## 2023-05-26 LAB — T-HELPER CELLS (CD4) COUNT (NOT AT ARMC)
Absolute CD4: 1064 cells/uL (ref 490–1740)
CD4 T Helper %: 40 % (ref 30–61)
Total lymphocyte count: 2646 cells/uL (ref 850–3900)

## 2023-05-26 LAB — TESTOSTERONE TOTAL,FREE,BIO, MALES
Albumin: 4.6 g/dL (ref 3.6–5.1)
Sex Hormone Binding: 34 nmol/L (ref 10–50)
Testosterone, Bioavailable: 209.3 ng/dL (ref 110.0–575.0)
Testosterone, Free: 99.7 pg/mL (ref 46.0–224.0)
Testosterone: 713 ng/dL (ref 250–827)

## 2023-05-26 LAB — HIV-1 RNA QUANT-NO REFLEX-BLD
HIV 1 RNA Quant: NOT DETECTED {copies}/mL
HIV-1 RNA Quant, Log: NOT DETECTED {Log_copies}/mL

## 2023-05-26 LAB — ESTRADIOL: Estradiol: 45 pg/mL — ABNORMAL HIGH (ref <=39)

## 2023-06-26 ENCOUNTER — Other Ambulatory Visit: Payer: Self-pay | Admitting: Pharmacist

## 2023-06-26 DIAGNOSIS — B20 Human immunodeficiency virus [HIV] disease: Secondary | ICD-10-CM

## 2023-07-03 ENCOUNTER — Telehealth: Payer: Self-pay

## 2023-07-03 NOTE — Telephone Encounter (Signed)
[facility] Patient Advocate Encounter  Patient's medication [name]) have been couriered to [facility] from [employer] and will be administered on the patient next office visit on 07/14/23.  Clearance Coots , CPhT Specialty Pharmacy Patient [facility] Phone: [phone] Fax:  [phone]

## 2023-07-08 DIAGNOSIS — Z789 Other specified health status: Secondary | ICD-10-CM

## 2023-07-14 ENCOUNTER — Ambulatory Visit: Payer: Medicaid Other | Admitting: Pharmacist

## 2023-07-14 MED ORDER — SPIRONOLACTONE 50 MG PO TABS
50.0000 mg | ORAL_TABLET | Freq: Every day | ORAL | 0 refills | Status: DC
Start: 2023-07-14 — End: 2023-10-20

## 2023-07-14 MED ORDER — SPIRONOLACTONE 100 MG PO TABS
ORAL_TABLET | ORAL | 0 refills | Status: DC
Start: 2023-07-14 — End: 2023-10-20

## 2023-07-14 MED ORDER — ESTRADIOL VALERATE 20 MG/ML IM OIL
TOPICAL_OIL | INTRAMUSCULAR | 0 refills | Status: DC
Start: 2023-07-14 — End: 2023-10-20

## 2023-07-15 ENCOUNTER — Encounter: Payer: Medicaid Other | Admitting: Infectious Disease

## 2023-07-22 ENCOUNTER — Encounter: Payer: Medicaid Other | Admitting: Pharmacist

## 2023-07-24 ENCOUNTER — Other Ambulatory Visit (HOSPITAL_COMMUNITY)
Admission: RE | Admit: 2023-07-24 | Discharge: 2023-07-24 | Disposition: A | Discharge status: Home / Self Care | Payer: Medicaid Other | Source: Ambulatory Visit | Attending: Physician Assistant | Admitting: Physician Assistant

## 2023-07-24 ENCOUNTER — Ambulatory Visit (INDEPENDENT_AMBULATORY_CARE_PROVIDER_SITE_OTHER): Payer: Medicaid Other | Admitting: Physician Assistant

## 2023-07-24 ENCOUNTER — Other Ambulatory Visit: Payer: Self-pay | Admitting: Physician Assistant

## 2023-07-24 ENCOUNTER — Encounter: Payer: Self-pay | Admitting: Physician Assistant

## 2023-07-24 ENCOUNTER — Other Ambulatory Visit: Payer: Self-pay

## 2023-07-24 VITALS — BP 138/76 | HR 74 | Temp 98.3°F | Ht 68.0 in | Wt 211.0 lb

## 2023-07-24 DIAGNOSIS — B20 Human immunodeficiency virus [HIV] disease: Secondary | ICD-10-CM | POA: Diagnosis not present

## 2023-07-24 DIAGNOSIS — Z113 Encounter for screening for infections with a predominantly sexual mode of transmission: Secondary | ICD-10-CM

## 2023-07-24 DIAGNOSIS — Z0000 Encounter for general adult medical examination without abnormal findings: Secondary | ICD-10-CM | POA: Diagnosis not present

## 2023-07-24 DIAGNOSIS — Z411 Encounter for cosmetic surgery: Secondary | ICD-10-CM

## 2023-07-24 DIAGNOSIS — Z1231 Encounter for screening mammogram for malignant neoplasm of breast: Secondary | ICD-10-CM

## 2023-07-24 MED ORDER — CABOTEGRAVIR & RILPIVIRINE ER 600 & 900 MG/3ML IM SUER
1.0000 | Freq: Once | INTRAMUSCULAR | Status: AC
Start: 2023-07-24 — End: 2023-07-24
  Administered 2023-07-24: 1 via INTRAMUSCULAR

## 2023-07-24 NOTE — Addendum Note (Signed)
Addended by: Philippa Chester on: 07/24/2023 11:35 AM   Modules accepted: Orders
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
## 2023-07-24 NOTE — Patient Instructions (Addendum)
Clearance for surgery approved healthy male STI screening Condoms and lubricant provided Cabanuva injection today Return in 2 months for [place]

## 2023-07-25 DIAGNOSIS — Z0000 Encounter for general adult medical examination without abnormal findings: Secondary | ICD-10-CM | POA: Diagnosis not present

## 2023-07-25 LAB — CYTOLOGY, (ORAL, ANAL, URETHRAL) ANCILLARY ONLY
Chlamydia: NEGATIVE
Chlamydia: NEGATIVE
Comment: NEGATIVE
Comment: NEGATIVE
Comment: NORMAL
Comment: NORMAL
Neisseria Gonorrhea: NEGATIVE
Neisseria Gonorrhea: POSITIVE — AB

## 2023-07-25 LAB — URINE CYTOLOGY ANCILLARY ONLY
Chlamydia: NEGATIVE
Comment: NEGATIVE
Comment: NORMAL
Neisseria Gonorrhea: NEGATIVE

## 2023-07-28 ENCOUNTER — Telehealth: Payer: Self-pay

## 2023-07-28 NOTE — Telephone Encounter (Signed)
-----   Message from [name] sent at 07/28/2023  8:52 AM EDT ----- Please notify Travis Palmer will need to return for Ceftriaxone 500 mg IM to treat her Anal gonorrhea. Make sure you either 1) don't have sex for a week or 2) use a condom with sex for at least a week. Condoms with sex is always recommended.

## 2023-07-28 NOTE — Telephone Encounter (Signed)
I attempted to contact the patient to relay lab results. Patient did not answer and VM left for patient to call back.  Travis Palmer

## 2023-07-30 NOTE — Telephone Encounter (Signed)
Patient advised of positive gonorrhea results, no sex until treatment and an additional 7-10 days after treatment. Patient verbalized understanding and scheduled for treatment. Travis Palmer

## 2023-07-31 ENCOUNTER — Ambulatory Visit (INDEPENDENT_AMBULATORY_CARE_PROVIDER_SITE_OTHER): Payer: Medicaid Other

## 2023-07-31 ENCOUNTER — Other Ambulatory Visit: Payer: Self-pay

## 2023-07-31 ENCOUNTER — Ambulatory Visit: Payer: Medicaid Other

## 2023-07-31 DIAGNOSIS — A549 Gonococcal infection, unspecified: Secondary | ICD-10-CM | POA: Diagnosis not present

## 2023-07-31 MED ORDER — CEFTRIAXONE SODIUM 500 MG IJ SOLR
500.0000 mg | Freq: Once | INTRAMUSCULAR | Status: AC
Start: 2023-07-31 — End: 2023-07-31
  Administered 2023-07-31: 500 mg via INTRAMUSCULAR

## 2023-07-31 NOTE — Progress Notes (Signed)
Patient here for gonorrhea treatment. Ceftriaxone 500 mg IM injected into left deltoid. Patient tolerated well. Patient aware to abstain from sex for 7-10 days post treatment. Patient aware to inform sexual partners to be tested and treated as well.   Teofilo Lupinacci Lesli Albee, CMA

## 2023-08-04 ENCOUNTER — Telehealth: Payer: Self-pay

## 2023-08-04 NOTE — Telephone Encounter (Signed)
Patient aware we have received surgical clearance paperwork for BBL/Breast Augmentation. Forms placed in [employer], PA-C box.    Twanisha Foulk Lesli Albee, CMA

## 2023-08-05 NOTE — Telephone Encounter (Signed)
Patient called upset stating that she has had the labs done at [facility]. I have found the labs and will put in [employer],PA-C box. Informed patient that she will need to have Breast US and EKG done prior to paperwork being filled out. Patient then questioned why wasn't the EKG done at last appointment. Informed patient that [employer],PA-C hadn't received the surgical clearance paperwork at appointment and she wasn't aware that patient would need an EKG. Patient stated that it shouldn't take this long to fill out surgical clearance and that her surgeons office keeps calling her. Advised patient I would send a message back to provider but she will still need to have breast US and EKG done. Patient then hung up the phone.    Travis Palmer Lesli Albee, CMA

## 2023-08-07 ENCOUNTER — Other Ambulatory Visit: Payer: Self-pay

## 2023-08-07 ENCOUNTER — Ambulatory Visit: Payer: Medicaid Other

## 2023-08-07 ENCOUNTER — Ambulatory Visit: Admission: RE | Admit: 2023-08-07 | Payer: Medicaid Other | Source: Ambulatory Visit

## 2023-08-07 NOTE — Telephone Encounter (Signed)
Patient rescheduled EKG nurse visit for 8/30 @ 9:30am

## 2023-08-08 ENCOUNTER — Other Ambulatory Visit: Payer: Self-pay

## 2023-08-08 ENCOUNTER — Ambulatory Visit
Admission: RE | Admit: 2023-08-08 | Discharge: 2023-08-08 | Disposition: A | Discharge status: Home / Self Care | Payer: Medicaid Other | Source: Ambulatory Visit | Attending: Physician Assistant | Admitting: Physician Assistant

## 2023-08-08 ENCOUNTER — Ambulatory Visit: Payer: Medicaid Other

## 2023-08-08 DIAGNOSIS — Z1239 Encounter for other screening for malignant neoplasm of breast: Secondary | ICD-10-CM | POA: Diagnosis not present

## 2023-08-08 DIAGNOSIS — F649 Gender identity disorder, unspecified: Secondary | ICD-10-CM | POA: Diagnosis not present

## 2023-08-08 NOTE — Progress Notes (Signed)
EKG completed and uploaded to media section of chart.   Sandie Ano, RN

## 2023-08-12 NOTE — Telephone Encounter (Signed)
Faxed medical clearance form to [facility]. Received confrimation that fax was successful. Will place copy of form in scan folder and copy in triage in accordion folder. Faxed labs and [place] results. F: [phone]. Juanita Laster, RMA

## 2023-08-18 NOTE — Telephone Encounter (Signed)
Patient called stating that surgeons office didn't receive EKG. Faxed copies of EKG to [facility].    Mehdi Gironda Lesli Albee, CMA

## 2023-09-10 ENCOUNTER — Other Ambulatory Visit (HOSPITAL_COMMUNITY): Payer: Self-pay

## 2023-09-11 ENCOUNTER — Telehealth: Payer: Self-pay

## 2023-09-11 NOTE — Telephone Encounter (Signed)
[facility] Patient Advocate Encounter  Patient's medications (CABENUVA) have been couriered to [facility] from [facility] and will be administered at the patients appointment on 09/18/23.  Kae Heller , CPhT Specialty Pharmacy Patient [facility] Phone: [phone] Fax:  [phone]
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
## 2023-09-18 ENCOUNTER — Encounter: Payer: Medicaid Other | Admitting: Pharmacist

## 2023-10-17 ENCOUNTER — Telehealth: Payer: Self-pay

## 2023-10-17 NOTE — Telephone Encounter (Signed)
Will defer to [facility]. If Travis Palmer is resumed, would need to restart with two monthly loading doses given over the weekend she will be more than one month outside of her planned target date in October that was missed. Last injection was given in August. - Jordy Hewins

## 2023-10-17 NOTE — Telephone Encounter (Signed)
Patient called requesting appointment. Advised Calone,NP doesn't have availability today. Patient wants to continue Cabenuva. Patient stated that she didn't expect to be "down" as long as she was post BBL surgery. Please advise.    Alister Staver Lesli Albee, CMA

## 2023-10-17 NOTE — Telephone Encounter (Signed)
I think she is actually a Dr. Daiva Eves patient. Will loop him in as well.

## 2023-10-20 ENCOUNTER — Other Ambulatory Visit: Payer: Self-pay

## 2023-10-20 ENCOUNTER — Other Ambulatory Visit: Payer: Self-pay | Admitting: Pharmacist

## 2023-10-20 ENCOUNTER — Other Ambulatory Visit (HOSPITAL_COMMUNITY)
Admission: RE | Admit: 2023-10-20 | Discharge: 2023-10-20 | Disposition: A | Discharge status: Home / Self Care | Payer: Medicaid Other | Source: Ambulatory Visit | Attending: Infectious Disease | Admitting: Infectious Disease

## 2023-10-20 ENCOUNTER — Ambulatory Visit: Payer: Medicaid Other | Admitting: Pharmacist

## 2023-10-20 DIAGNOSIS — Z113 Encounter for screening for infections with a predominantly sexual mode of transmission: Secondary | ICD-10-CM

## 2023-10-20 DIAGNOSIS — B20 Human immunodeficiency virus [HIV] disease: Secondary | ICD-10-CM | POA: Diagnosis not present

## 2023-10-20 DIAGNOSIS — Z789 Other specified health status: Secondary | ICD-10-CM

## 2023-10-20 DIAGNOSIS — Z79890 Hormone replacement therapy (postmenopausal): Secondary | ICD-10-CM | POA: Diagnosis not present

## 2023-10-20 MED ORDER — SPIRONOLACTONE 50 MG PO TABS: 50.0000 mg | ORAL_TABLET | Freq: Every day | ORAL | 0 refills | Status: DC

## 2023-10-20 MED ORDER — SYMTUZA 800-150-200-10 MG PO TABS: 1.0000 | ORAL_TABLET | Freq: Every day | ORAL | 2 refills | Status: DC

## 2023-10-20 MED ORDER — BD LUER-LOK SYRINGE 21G X 1-1/2" 3 ML MISC: 4 refills | Status: DC

## 2023-10-20 MED ORDER — BD HYPODERMIC NEEDLE 18G X 1" MISC: 0 refills | Status: DC

## 2023-10-20 MED ORDER — ESTRADIOL VALERATE 20 MG/ML IM OIL: TOPICAL_OIL | INTRAMUSCULAR | 0 refills | Status: DC

## 2023-10-20 MED ORDER — SPIRONOLACTONE 100 MG PO TABS: ORAL_TABLET | ORAL | 0 refills | Status: DC
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
## 2023-10-20 NOTE — Telephone Encounter (Signed)
Dr. Daiva Eves - she is scheduled with me today. Do you want me to go ahead and restart or check labs first and put her on Symtuza in the meantime?

## 2023-10-21 LAB — URINE CYTOLOGY ANCILLARY ONLY
Chlamydia: NEGATIVE
Comment: NEGATIVE
Comment: NORMAL
Neisseria Gonorrhea: NEGATIVE

## 2023-10-21 LAB — CYTOLOGY, (ORAL, ANAL, URETHRAL) ANCILLARY ONLY
Chlamydia: NEGATIVE
Comment: NEGATIVE
Comment: NORMAL
Neisseria Gonorrhea: NEGATIVE

## 2023-10-22 LAB — SPECIMEN STATUS REPORT

## 2023-10-23 ENCOUNTER — Other Ambulatory Visit: Payer: Self-pay | Admitting: Pharmacist

## 2023-10-23 ENCOUNTER — Other Ambulatory Visit: Payer: Self-pay

## 2023-10-23 ENCOUNTER — Other Ambulatory Visit (HOSPITAL_COMMUNITY)
Admission: RE | Admit: 2023-10-23 | Discharge: 2023-10-23 | Disposition: A | Discharge status: Home / Self Care | Payer: Medicaid Other | Source: Ambulatory Visit | Attending: Infectious Disease | Admitting: Infectious Disease

## 2023-10-23 ENCOUNTER — Encounter: Payer: Self-pay | Admitting: Pharmacist

## 2023-10-23 ENCOUNTER — Other Ambulatory Visit: Payer: Medicaid Other

## 2023-10-23 ENCOUNTER — Ambulatory Visit: Payer: Medicaid Other

## 2023-10-23 DIAGNOSIS — Z113 Encounter for screening for infections with a predominantly sexual mode of transmission: Secondary | ICD-10-CM | POA: Diagnosis present

## 2023-10-23 LAB — HIV RNA, RTPCR W/R GT (RTI, PI,INT)
HIV 1 RNA Quant: 42 {copies}/mL — ABNORMAL HIGH
HIV-1 RNA Quant, Log: 1.62 {Log} — ABNORMAL HIGH

## 2023-10-23 LAB — SPECIMEN STATUS REPORT

## 2023-10-23 LAB — RPR: RPR Ser Ql: NONREACTIVE

## 2023-10-23 LAB — GENOSURE ARCHIVE(SM)

## 2023-10-27 ENCOUNTER — Encounter: Payer: Self-pay | Admitting: Pharmacist

## 2023-10-27 ENCOUNTER — Other Ambulatory Visit: Payer: Self-pay | Admitting: Pharmacist

## 2023-10-27 DIAGNOSIS — B20 Human immunodeficiency virus [HIV] disease: Secondary | ICD-10-CM

## 2023-10-27 DIAGNOSIS — A749 Chlamydial infection, unspecified: Secondary | ICD-10-CM

## 2023-10-27 DIAGNOSIS — Z79890 Hormone replacement therapy (postmenopausal): Secondary | ICD-10-CM

## 2023-10-27 DIAGNOSIS — Z789 Other specified health status: Secondary | ICD-10-CM

## 2023-10-27 LAB — CYTOLOGY, (ORAL, ANAL, URETHRAL) ANCILLARY ONLY
Chlamydia: POSITIVE — AB
Comment: NEGATIVE
Comment: NORMAL
Neisseria Gonorrhea: NEGATIVE

## 2023-10-27 MED ORDER — SYMTUZA 800-150-200-10 MG PO TABS: 1.0000 | ORAL_TABLET | Freq: Every day | ORAL | 2 refills | Status: DC

## 2023-10-27 MED ORDER — SPIRONOLACTONE 50 MG PO TABS: 50.0000 mg | ORAL_TABLET | Freq: Every day | ORAL | 4 refills | Status: DC

## 2023-10-27 MED ORDER — BD LUER-LOK SYRINGE 21G X 1-1/2" 3 ML MISC
4 refills | Status: DC
Start: 2023-10-27 — End: 2024-04-07

## 2023-10-27 MED ORDER — BD HYPODERMIC NEEDLE 18G X 1" MISC
4 refills | Status: DC
Start: 2023-10-27 — End: 2024-06-14

## 2023-10-27 MED ORDER — SPIRONOLACTONE 100 MG PO TABS: ORAL_TABLET | ORAL | 4 refills | Status: DC

## 2023-10-27 MED ORDER — DOXYCYCLINE HYCLATE 100 MG PO TABS
100.0000 mg | ORAL_TABLET | Freq: Two times a day (BID) | ORAL | 0 refills | Status: AC
Start: 2023-10-27 — End: 2023-11-03

## 2023-10-27 MED ORDER — ESTRADIOL VALERATE 20 MG/ML IM OIL: TOPICAL_OIL | INTRAMUSCULAR | 1 refills | Status: DC

## 2023-10-27 MED ORDER — DOXYCYCLINE HYCLATE 100 MG PO TABS: 100.0000 mg | ORAL_TABLET | Freq: Two times a day (BID) | ORAL | 0 refills | Status: DC

## 2023-10-27 NOTE — Addendum Note (Signed)
Addended by: Robinette Haines on: 10/27/2023 03:23 PM   Modules accepted: Orders

## 2023-11-12 ENCOUNTER — Other Ambulatory Visit: Payer: Self-pay | Admitting: Pharmacist

## 2023-11-12 DIAGNOSIS — A749 Chlamydial infection, unspecified: Secondary | ICD-10-CM

## 2023-11-12 DIAGNOSIS — B20 Human immunodeficiency virus [HIV] disease: Secondary | ICD-10-CM

## 2023-11-12 MED ORDER — DOXYCYCLINE HYCLATE 100 MG PO TABS: ORAL_TABLET | ORAL | 5 refills | Status: DC

## 2023-11-25 ENCOUNTER — Emergency Department (HOSPITAL_COMMUNITY): Payer: Medicaid Other

## 2023-11-25 ENCOUNTER — Other Ambulatory Visit: Payer: Self-pay

## 2023-11-25 ENCOUNTER — Emergency Department (HOSPITAL_COMMUNITY)
Admission: EM | Admit: 2023-11-25 | Discharge: 2023-11-26 | Discharge status: Against Medical Advice | Payer: Medicaid Other | Attending: Nurse Practitioner | Admitting: Nurse Practitioner

## 2023-11-25 DIAGNOSIS — R197 Diarrhea, unspecified: Secondary | ICD-10-CM | POA: Insufficient documentation

## 2023-11-25 DIAGNOSIS — R1012 Left upper quadrant pain: Secondary | ICD-10-CM | POA: Insufficient documentation

## 2023-11-25 DIAGNOSIS — Z5321 Procedure and treatment not carried out due to patient leaving prior to being seen by health care provider: Secondary | ICD-10-CM | POA: Insufficient documentation

## 2023-11-25 DIAGNOSIS — R112 Nausea with vomiting, unspecified: Secondary | ICD-10-CM | POA: Diagnosis not present

## 2023-11-25 LAB — CBC WITH DIFFERENTIAL/PLATELET
Abs Immature Granulocytes: 0.02 10*3/uL (ref 0.00–0.07)
Basophils Absolute: 0 10*3/uL (ref 0.0–0.1)
Basophils Relative: 1 %
Eosinophils Absolute: 0 10*3/uL (ref 0.0–0.5)
Eosinophils Relative: 0 %
HCT: 36.5 % — ABNORMAL LOW (ref 39.0–52.0)
Hemoglobin: 12.7 g/dL — ABNORMAL LOW (ref 13.0–17.0)
Immature Granulocytes: 0 %
Lymphocytes Relative: 22 %
Lymphs Abs: 1.5 10*3/uL (ref 0.7–4.0)
MCH: 32.1 pg (ref 26.0–34.0)
MCHC: 34.8 g/dL (ref 30.0–36.0)
MCV: 92.2 fL (ref 80.0–100.0)
Monocytes Absolute: 0.3 10*3/uL (ref 0.1–1.0)
Monocytes Relative: 5 %
Neutro Abs: 4.7 10*3/uL (ref 1.7–7.7)
Neutrophils Relative %: 72 %
Platelets: 351 10*3/uL (ref 150–400)
RBC: 3.96 MIL/uL — ABNORMAL LOW (ref 4.22–5.81)
RDW: 12.9 % (ref 11.5–15.5)
WBC: 6.6 10*3/uL (ref 4.0–10.5)
nRBC: 0 % (ref 0.0–0.2)

## 2023-11-25 LAB — COMPREHENSIVE METABOLIC PANEL
ALT: 19 U/L (ref 0–44)
AST: 16 U/L (ref 15–41)
Albumin: 4.1 g/dL (ref 3.5–5.0)
Alkaline Phosphatase: 47 U/L (ref 38–126)
Anion gap: 8 (ref 5–15)
BUN: 8 mg/dL (ref 6–20)
CO2: 24 mmol/L (ref 22–32)
Calcium: 9 mg/dL (ref 8.9–10.3)
Chloride: 105 mmol/L (ref 98–111)
Creatinine, Ser: 0.67 mg/dL (ref 0.61–1.24)
GFR, Estimated: >60 mL/min (ref >60)
Glucose, Bld: 114 mg/dL — ABNORMAL HIGH (ref 70–99)
Potassium: 3.7 mmol/L (ref 3.5–5.1)
Sodium: 137 mmol/L (ref 135–145)
Total Bilirubin: 0.6 mg/dL (ref <1.2)
Total Protein: 6.8 g/dL (ref 6.5–8.1)

## 2023-11-25 LAB — LIPASE, BLOOD: Lipase: 22 U/L (ref 11–51)

## 2023-11-25 MED ORDER — IOHEXOL 350 MG/ML SOLN: 75.0000 mL | Freq: Once | INTRAVENOUS | Status: DC | PRN

## 2023-11-25 NOTE — ED Triage Notes (Signed)
Patient reports left abdominal pain with emesis and diarrhea onset yesterday .

## 2023-11-25 NOTE — ED Provider Triage Note (Signed)
Emergency Medicine Provider Triage Evaluation Note  Travis Palmer , a 27 y.o. adult  was evaluated in triage.  Transgender patient (M to F) reporting LUQ pain with nausea, vomiting, and diarrhea onset 2 days ago. History of pancreatitis. Not diabetic. Occasional alcohol use.  Review of Systems  Positive: LUQ abdominal pain, nausea, vomiting, diarrhea Negative: Urinary symptoms, fever, chills  Physical Exam  BP (!) 149/83 (BP Location: Right Arm)   Pulse (!) 59   Temp 98 F (36.7 C)   Resp 18   SpO2 99%  Gen:   Awake, no distress   Resp:  Normal effort  MSK:   Moves extremities without difficulty  Other:  Abdomen soft, TTP LUQ  Medical Decision Making  Medically screening exam initiated at 8:43 PM.  Appropriate orders placed.  [name] was informed that the remainder of the evaluation will be completed by another provider, this initial triage assessment does not replace that evaluation, and the importance of remaining in the ED until their evaluation is complete.     Felicie Morn, NP 11/25/23 [id number]

## 2023-11-26 NOTE — ED Notes (Signed)
NA for roll call  

## 2024-01-06 ENCOUNTER — Telehealth: Payer: Self-pay

## 2024-01-06 ENCOUNTER — Other Ambulatory Visit (HOSPITAL_COMMUNITY): Payer: Self-pay

## 2024-01-06 NOTE — Telephone Encounter (Signed)
Cumulative HIV Genotype Data  RT Mutations  Q102K, C162S, V179I, G196E, I202V, R211K, V245M, D250E, A272P, K275Q, T286A, V262I, V293I, E297K, S322E, I326V, Q334D, G335D, R356K, M357R, K358R, G359S, A376T, K385R, T386I, K390R  PI Mutations  L10V, T12A, R57K, L63P, I93L  Integrase Mutations  S17N, R20K, V31I, V72I, L101I, T112I, V113I, T124N, K156N, K201I, N222K, S230N, V234L, D256E, I268L   Interpretation of Genotype Data per [employer] HIV Drug Resistance Database:  Nucleoside RTIs  Abacavir - Susceptible Zidovudine - Susceptible Emtricitabine - Susceptible Lamivudine - Susceptible Tenofovir - Susceptible   Non-Nucleoside RTIs  Doravirine - Susceptible Efavirenz - Susceptible Etravirine - Susceptible Nevirapine - Susceptible Rilpivirine - Susceptible   Protease Inhibitors  Atazanavir - Susceptible Darunavir - Susceptible Lopinavir - Susceptible   Integrase Inhibitors  Bictegravir - Susceptible Cabotegravir - Susceptible Dolutegravir - Susceptible Elvitegravir - Susceptible Raltegravir - Susceptible   Genotype Archive Reviewed and Input above. Travis Palmer would be appropriate in this patient.

## 2024-01-07 ENCOUNTER — Other Ambulatory Visit (HOSPITAL_COMMUNITY): Payer: Self-pay

## 2024-01-07 ENCOUNTER — Other Ambulatory Visit: Payer: Self-pay

## 2024-01-07 ENCOUNTER — Other Ambulatory Visit: Payer: Self-pay | Admitting: Pharmacist

## 2024-01-07 DIAGNOSIS — B20 Human immunodeficiency virus [HIV] disease: Secondary | ICD-10-CM

## 2024-01-07 MED ORDER — CABOTEGRAVIR & RILPIVIRINE ER 600 & 900 MG/3ML IM SUER
1.0000 | INTRAMUSCULAR | 1 refills | Status: DC
  Filled 2024-01-07: qty 6, 30d supply, fill #0
  Filled 2024-01-23: qty 6, 30d supply, fill #1
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
## 2024-01-07 NOTE — Telephone Encounter (Signed)
Discussed results of Genotype with [name]. She would like to proceed with Cabenuva ASAP. I have placed her on schedule with Cassie on 01/12/24 @ 2 PM. She endorses good compliance with Symtuza, but it is causing significant diarrhea. She declines missing any doses, but reports being due for a refill. Med dispense history shows last fill date of 11/21/23, indicating possibility that she may be out of medication or missing doses. Will forward to Cassie to confirm continuation of Cabenuva remains appropriate.

## 2024-01-07 NOTE — Telephone Encounter (Signed)
Sounds good. Thanks Fredric Mare. Will proceed with Cabenuva next week.

## 2024-01-07 NOTE — Progress Notes (Signed)
Specialty Pharmacy Initial Fill Coordination Note  Travis Palmer is a 28 y.o. adult contacted today regarding initial fill of specialty medication(s) Cabotegravir & Rilpivirine (CABENUVA)   Patient requested Courier to Provider Office   Delivery date: 01/09/24   Verified address: [id number] [place] [address]   Medication will be filled on 01/08/24.   Patient is aware of 0.00 copayment.

## 2024-01-08 ENCOUNTER — Other Ambulatory Visit: Payer: Self-pay

## 2024-01-09 ENCOUNTER — Telehealth: Payer: Self-pay

## 2024-01-09 NOTE — Telephone Encounter (Signed)
 [facility] Patient Advocate Encounter  Patient's medications CABENUVA have been couriered to [facility] from [facility] and will be administered at the patients appointment on 01/12/24.  Kae Heller, CPhT Specialty Pharmacy Patient [facility] Phone: [phone] Fax:  [phone]

## 2024-01-12 ENCOUNTER — Ambulatory Visit: Payer: Medicaid Other | Admitting: Pharmacist

## 2024-01-12 ENCOUNTER — Other Ambulatory Visit (HOSPITAL_COMMUNITY)
Admission: RE | Admit: 2024-01-12 | Discharge: 2024-01-12 | Disposition: A | Discharge status: Home / Self Care | Payer: Medicaid Other | Source: Ambulatory Visit | Attending: Infectious Disease | Admitting: Infectious Disease

## 2024-01-12 ENCOUNTER — Other Ambulatory Visit: Payer: Self-pay

## 2024-01-12 ENCOUNTER — Ambulatory Visit (INDEPENDENT_AMBULATORY_CARE_PROVIDER_SITE_OTHER): Payer: Medicaid Other | Admitting: Pharmacist

## 2024-01-12 DIAGNOSIS — B20 Human immunodeficiency virus [HIV] disease: Secondary | ICD-10-CM

## 2024-01-12 DIAGNOSIS — Z113 Encounter for screening for infections with a predominantly sexual mode of transmission: Secondary | ICD-10-CM

## 2024-01-12 MED ORDER — CABOTEGRAVIR & RILPIVIRINE ER 600 & 900 MG/3ML IM SUER
1.0000 | Freq: Once | INTRAMUSCULAR | Status: AC
  Administered 2024-01-12: 1 via INTRAMUSCULAR

## 2024-01-12 MED ORDER — DOXYCYCLINE HYCLATE 100 MG PO TABS
ORAL_TABLET | ORAL | 2 refills | Status: DC
  Filled 2024-01-12: qty 28, 14d supply, fill #0

## 2024-01-13 LAB — URINE CYTOLOGY ANCILLARY ONLY
Chlamydia: NEGATIVE
Comment: NEGATIVE
Comment: NORMAL
Neisseria Gonorrhea: NEGATIVE

## 2024-01-13 LAB — CYTOLOGY, (ORAL, ANAL, URETHRAL) ANCILLARY ONLY
Chlamydia: NEGATIVE
Chlamydia: POSITIVE — AB
Comment: NEGATIVE
Comment: NEGATIVE
Comment: NORMAL
Comment: NORMAL
Neisseria Gonorrhea: NEGATIVE
Neisseria Gonorrhea: NEGATIVE

## 2024-01-13 LAB — T-HELPER CELLS (CD4) COUNT (NOT AT ARMC)
CD4 % Helper T Cell: 39 % (ref 33–65)
CD4 T Cell Abs: 859 /uL (ref 400–1790)

## 2024-01-14 ENCOUNTER — Encounter: Payer: Self-pay | Admitting: Pharmacist

## 2024-01-14 LAB — HIV-1 RNA QUANT-NO REFLEX-BLD
HIV 1 RNA Quant: NOT DETECTED {copies}/mL
HIV-1 RNA Quant, Log: NOT DETECTED {Log}

## 2024-01-14 LAB — RPR: RPR Ser Ql: NONREACTIVE

## 2024-01-22 ENCOUNTER — Encounter: Payer: Self-pay | Admitting: Pharmacist

## 2024-01-23 ENCOUNTER — Other Ambulatory Visit: Payer: Self-pay

## 2024-01-23 ENCOUNTER — Other Ambulatory Visit (HOSPITAL_COMMUNITY): Payer: Self-pay

## 2024-01-23 NOTE — Progress Notes (Signed)
Specialty Pharmacy Refill Coordination Note  Travis Palmer is a 28 y.o. adult assessed today regarding refills of clinic administered specialty medication(s) Cabotegravir & Rilpivirine (CABENUVA)   Clinic requested Courier to Provider Office   Delivery date: 02/06/24   Verified address: [address] [place] [address]   Medication will be filled on 02/05/24.

## 2024-02-04 ENCOUNTER — Other Ambulatory Visit: Payer: Self-pay | Admitting: Pharmacist

## 2024-02-04 ENCOUNTER — Other Ambulatory Visit: Payer: Self-pay

## 2024-02-04 DIAGNOSIS — B20 Human immunodeficiency virus [HIV] disease: Secondary | ICD-10-CM

## 2024-02-04 MED ORDER — DOXYCYCLINE HYCLATE 100 MG PO TABS
ORAL_TABLET | ORAL | 0 refills | Status: DC
  Filled 2024-02-04: qty 60, 30d supply, fill #0

## 2024-02-05 ENCOUNTER — Other Ambulatory Visit: Payer: Self-pay

## 2024-02-06 ENCOUNTER — Telehealth: Payer: Self-pay

## 2024-02-06 NOTE — Telephone Encounter (Signed)
 [facility] Patient Advocate Encounter  Patient's medications CABENUVA have been couriered to [facility] from [facility] and will be administered at the patients appointment on 02/10/24.  Kae Heller, CPhT Specialty Pharmacy Patient [facility] Phone: [phone] Fax:  [phone]

## 2024-02-09 ENCOUNTER — Other Ambulatory Visit: Payer: Self-pay

## 2024-02-09 ENCOUNTER — Ambulatory Visit: Payer: Medicaid Other | Admitting: Pharmacist
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
## 2024-02-10 ENCOUNTER — Ambulatory Visit (INDEPENDENT_AMBULATORY_CARE_PROVIDER_SITE_OTHER): Payer: Medicaid Other | Admitting: Pharmacist

## 2024-02-10 ENCOUNTER — Other Ambulatory Visit: Payer: Self-pay

## 2024-02-10 DIAGNOSIS — B20 Human immunodeficiency virus [HIV] disease: Secondary | ICD-10-CM

## 2024-02-10 DIAGNOSIS — Z113 Encounter for screening for infections with a predominantly sexual mode of transmission: Secondary | ICD-10-CM

## 2024-02-10 MED ORDER — CABOTEGRAVIR & RILPIVIRINE ER 600 & 900 MG/3ML IM SUER
1.0000 | Freq: Once | INTRAMUSCULAR | Status: AC
  Administered 2024-02-10: 1 via INTRAMUSCULAR

## 2024-02-10 MED ORDER — DOXYCYCLINE HYCLATE 100 MG PO TABS
ORAL_TABLET | ORAL | 0 refills | Status: DC
  Filled 2024-02-10: qty 90, 45d supply, fill #0
  Filled 2024-04-07: qty 60, 30d supply, fill #0

## 2024-02-26 ENCOUNTER — Telehealth: Payer: Self-pay | Admitting: Pharmacist

## 2024-02-26 NOTE — Telephone Encounter (Signed)
 Received fax from [employer] services (attn: Albertina Parr) stating that [name]'s genosure archive from 10/20/2023 would not be approved through insurance without a letter of medical necessity along with chart note documentation. Filled out letter of medical necessity form and faxed it along with chart notes.  Margarite Gouge, PharmD, CPP, BCIDP, AAHIVP Clinical Pharmacist Practitioner Infectious Diseases Clinical Pharmacist [facility]

## 2024-03-24 ENCOUNTER — Other Ambulatory Visit (HOSPITAL_COMMUNITY): Payer: Self-pay

## 2024-03-25 ENCOUNTER — Other Ambulatory Visit: Payer: Self-pay

## 2024-03-25 ENCOUNTER — Other Ambulatory Visit: Payer: Self-pay | Admitting: Pharmacist

## 2024-03-25 ENCOUNTER — Other Ambulatory Visit (HOSPITAL_COMMUNITY): Payer: Self-pay

## 2024-03-25 DIAGNOSIS — B20 Human immunodeficiency virus [HIV] disease: Secondary | ICD-10-CM

## 2024-03-25 MED ORDER — CABENUVA 600 & 900 MG/3ML IM SUER
1.0000 | INTRAMUSCULAR | 5 refills | Status: AC
  Filled 2024-03-25: qty 6, 34d supply, fill #0
  Filled 2024-06-07: qty 6, 34d supply, fill #1
  Filled 2024-08-05: qty 6, 34d supply, fill #2
  Filled 2024-09-29: qty 6, 34d supply, fill #3
  Filled 2024-11-11: qty 6, 34d supply, fill #4

## 2024-03-25 NOTE — Progress Notes (Signed)
 Specialty Pharmacy Refill Coordination Note  Travis Palmer is a 28 y.o. adult assessed today regarding refills of clinic administered specialty medication(s) Cabotegravir & Rilpivirine (Cabenuva)   Clinic requested Courier to Provider Office   Delivery date: 03/31/24   Verified address: [address] [place] [address]   Medication will be filled on 03/30/24.

## 2024-03-30 ENCOUNTER — Other Ambulatory Visit: Payer: Self-pay

## 2024-03-31 ENCOUNTER — Telehealth: Payer: Self-pay

## 2024-03-31 NOTE — Telephone Encounter (Signed)
 [facility] Patient Advocate Encounter  Patient's medications CABENUVA  have been couriered to [facility] from [facility] and will be administered at the patients appointment on 04/05/24.  Verline Glow, CPhT Specialty Pharmacy Patient [facility] Phone: [phone] Fax:  [phone]

## 2024-04-05 ENCOUNTER — Ambulatory Visit: Admitting: Infectious Disease

## 2024-04-05 DIAGNOSIS — B20 Human immunodeficiency virus [HIV] disease: Secondary | ICD-10-CM

## 2024-04-05 DIAGNOSIS — Z789 Other specified health status: Secondary | ICD-10-CM

## 2024-04-07 ENCOUNTER — Ambulatory Visit: Admitting: Family

## 2024-04-07 ENCOUNTER — Other Ambulatory Visit: Payer: Self-pay

## 2024-04-07 ENCOUNTER — Encounter: Payer: Self-pay | Admitting: Family

## 2024-04-07 VITALS — BP 123/81 | HR 81 | Temp 98.1°F | Wt 185.4 lb

## 2024-04-07 DIAGNOSIS — Z789 Other specified health status: Secondary | ICD-10-CM

## 2024-04-07 DIAGNOSIS — F640 Transsexualism: Secondary | ICD-10-CM | POA: Diagnosis not present

## 2024-04-07 DIAGNOSIS — B20 Human immunodeficiency virus [HIV] disease: Secondary | ICD-10-CM | POA: Diagnosis not present

## 2024-04-07 DIAGNOSIS — F419 Anxiety disorder, unspecified: Secondary | ICD-10-CM

## 2024-04-07 DIAGNOSIS — Z113 Encounter for screening for infections with a predominantly sexual mode of transmission: Secondary | ICD-10-CM | POA: Diagnosis not present

## 2024-04-07 DIAGNOSIS — Z0000 Encounter for general adult medical examination without abnormal findings: Secondary | ICD-10-CM

## 2024-04-07 MED ORDER — SPIRONOLACTONE 100 MG PO TABS
100.0000 mg | ORAL_TABLET | ORAL | 4 refills | Status: DC
  Filled 2024-04-07: qty 30, 30d supply, fill #0

## 2024-04-07 MED ORDER — ESTRADIOL 0.1 MG/24HR TD PTWK
0.2000 mg | MEDICATED_PATCH | TRANSDERMAL | 2 refills | Status: DC
  Filled 2024-04-07: qty 8, 28d supply, fill #0

## 2024-04-07 MED ORDER — CABOTEGRAVIR & RILPIVIRINE ER 600 & 900 MG/3ML IM SUER
1.0000 | Freq: Once | INTRAMUSCULAR | Status: AC
  Administered 2024-04-07: 1 via INTRAMUSCULAR

## 2024-04-07 MED ORDER — SPIRONOLACTONE 50 MG PO TABS
50.0000 mg | ORAL_TABLET | Freq: Every day | ORAL | 4 refills | Status: DC
  Filled 2024-04-07: qty 30, 30d supply, fill #0

## 2024-04-07 NOTE — Assessment & Plan Note (Signed)
 Travis Palmer continues to have well controlled virus with good adherence and tolerance to Cabenuva .  Reviewed previous lab work and discussed plan of care and U equals U.  Continue current dose of Cabenuva  with injection provided without complication.  Check blood work.  Covered by Medicaid.  Social determinants of health reviewed with no interventions indicated.  Plan for follow-up in 4 months or sooner if needed and with pharmacy provider in between.

## 2024-04-07 NOTE — Patient Instructions (Addendum)
 Nice to see you.  We will check your lab work today.  Continue to take your medication daily as prescribed.  Refills have been sent to the pharmacy.  Plan for follow up in 2 months with pharmacy provider and 4 months with NP/MD or sooner if needed with lab work on the same day.  Have a great day and stay safe!  Mental Health Resources  988: can call or text 24/7  [facility]: Address: [address] [place], [place] [address] Open 24 hours Phone: [phone]  [facility]: Address: [address], [place], [place] [address] Phone: [phone] Appointments: [email or web address]

## 2024-04-07 NOTE — Assessment & Plan Note (Signed)
 Discussed importance of safe sexual practice and condom use. Condoms and site specific STD testing offered.  Vaccinations reviewed and declined following counseling. Due for routine dental care. Declined referral to [facility] dental clinic.

## 2024-04-07 NOTE — Assessment & Plan Note (Signed)
 Travis Palmer has a GAD-7 score of 17 indicating severe anxiety. Counseling resources provided in AVS. May benefit from medication but does not like taking pills. Will continue to monitor for now.
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
## 2024-04-07 NOTE — Assessment & Plan Note (Signed)
 [name] has not been taking her estradiol  in the last couple of months and is interested in transitioning to patches as she has no desire to take pills. Discussed risks and benefits of estradiol  delivery methods. Will discontinue injections and start Estradiol  0.1 mg/day with 2 patches q 7 days. This will be lower than her previous estrogen dose. Continue current dose of spironolactone . Will plan to check lab work at her next office visit.

## 2024-04-08 ENCOUNTER — Other Ambulatory Visit: Payer: Self-pay

## 2024-04-08 LAB — CT/NG RNA, TMA RECTAL
Chlamydia Trachomatis RNA: NOT DETECTED
Neisseria Gonorrhoeae RNA: DETECTED — AB

## 2024-04-08 LAB — GC/CHLAMYDIA PROBE, AMP (THROAT)
Chlamydia trachomatis RNA: NOT DETECTED
Neisseria gonorrhoeae RNA: NOT DETECTED

## 2024-04-08 LAB — C. TRACHOMATIS/N. GONORRHOEAE RNA
C. trachomatis RNA, TMA: NOT DETECTED
N. gonorrhoeae RNA, TMA: NOT DETECTED

## 2024-04-09 ENCOUNTER — Other Ambulatory Visit: Payer: Self-pay

## 2024-04-09 ENCOUNTER — Telehealth: Payer: Self-pay

## 2024-04-09 ENCOUNTER — Ambulatory Visit (INDEPENDENT_AMBULATORY_CARE_PROVIDER_SITE_OTHER)

## 2024-04-09 DIAGNOSIS — A549 Gonococcal infection, unspecified: Secondary | ICD-10-CM

## 2024-04-09 LAB — COMPLETE METABOLIC PANEL WITHOUT GFR
AG Ratio: 2 (calc) (ref 1.0–2.5)
ALT: 16 U/L (ref 9–46)
AST: 17 U/L (ref 10–40)
Albumin: 4.6 g/dL (ref 3.6–5.1)
Alkaline phosphatase (APISO): 47 U/L (ref 36–130)
BUN: 8 mg/dL (ref 7–25)
CO2: 32 mmol/L (ref 20–32)
Calcium: 9.3 mg/dL (ref 8.6–10.3)
Chloride: 104 mmol/L (ref 98–110)
Creat: 0.85 mg/dL (ref 0.60–1.24)
Globulin: 2.3 g/dL (ref 1.9–3.7)
Glucose, Bld: 57 mg/dL — ABNORMAL LOW (ref 65–99)
Potassium: 4.5 mmol/L (ref 3.5–5.3)
Sodium: 142 mmol/L (ref 135–146)
Total Bilirubin: 0.4 mg/dL (ref 0.2–1.2)
Total Protein: 6.9 g/dL (ref 6.1–8.1)

## 2024-04-09 LAB — T-HELPER CELLS (CD4) COUNT (NOT AT ARMC)
Absolute CD4: 908 {cells}/uL (ref 490–1740)
CD4 T Helper %: 42 % (ref 30–61)
Total lymphocyte count: 2160 {cells}/uL (ref 850–3900)

## 2024-04-09 LAB — HIV-1 RNA QUANT-NO REFLEX-BLD
HIV 1 RNA Quant: NOT DETECTED {copies}/mL
HIV-1 RNA Quant, Log: NOT DETECTED {Log_copies}/mL

## 2024-04-09 LAB — RPR: RPR Ser Ql: NONREACTIVE

## 2024-04-09 MED ORDER — CEFTRIAXONE SODIUM 500 MG IJ SOLR
500.0000 mg | Freq: Once | INTRAMUSCULAR | Status: AC
  Administered 2024-04-09: 500 mg via INTRAMUSCULAR

## 2024-04-09 NOTE — Telephone Encounter (Signed)
 Patient treated for gonorrhea today. 500 mg ceftriaxone  x1

## 2024-04-09 NOTE — Progress Notes (Signed)
 Patient in office today for gonorrhea treatment. Ceftriaxone  500 mg x 1. Patient tolerated well. Patient given condoms and advised no sex for 7-10 days  Travis Palmer, CMA

## 2024-04-09 NOTE — Telephone Encounter (Signed)
-----   Message from Annamaria Barrette sent at 04/09/2024  8:12 AM EDT ----- Please inform [name] her lab work looks good but did test positive for gonorrhea and will need to be treated with 500 mg Ceftriaxone  IM once please. Thanks.

## 2024-04-23 DIAGNOSIS — Z202 Contact with and (suspected) exposure to infections with a predominantly sexual mode of transmission: Secondary | ICD-10-CM | POA: Diagnosis not present

## 2024-04-23 DIAGNOSIS — A64 Unspecified sexually transmitted disease: Secondary | ICD-10-CM | POA: Diagnosis not present

## 2024-06-07 ENCOUNTER — Other Ambulatory Visit: Payer: Self-pay

## 2024-06-07 ENCOUNTER — Other Ambulatory Visit (HOSPITAL_COMMUNITY): Payer: Self-pay

## 2024-06-07 NOTE — Progress Notes (Signed)
 Specialty Pharmacy Refill Coordination Note  Travis Palmer is a 28 y.o. adult assessed today regarding refills of clinic administered specialty medication(s) Cabotegravir  & Rilpivirine  (Cabenuva )   Clinic requested Courier to Provider Office   Delivery date: 06/09/24   Verified address: [address] [employer] [address] [place] [address]   Medication will be filled on 06/08/24.

## 2024-06-08 ENCOUNTER — Other Ambulatory Visit: Payer: Self-pay

## 2024-06-09 ENCOUNTER — Telehealth: Payer: Self-pay
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
## 2024-06-09 NOTE — Telephone Encounter (Signed)
 [facility] Patient Advocate Encounter  Patient's medications CABENUVA  have been couriered to [facility] from [facility] and will be administered at the patients appointment on 06/14/24.  Charmaine Sharps, CPhT Specialty Pharmacy Patient [facility] for Infectious Disease Phone: [phone] Fax:  [phone]

## 2024-06-14 ENCOUNTER — Other Ambulatory Visit: Payer: Self-pay

## 2024-06-14 ENCOUNTER — Other Ambulatory Visit (HOSPITAL_COMMUNITY)
Admission: RE | Admit: 2024-06-14 | Discharge: 2024-06-14 | Disposition: A | Discharge status: Home / Self Care | Source: Ambulatory Visit | Attending: Infectious Disease | Admitting: Infectious Disease

## 2024-06-14 ENCOUNTER — Ambulatory Visit: Payer: Self-pay | Admitting: Pharmacist

## 2024-06-14 DIAGNOSIS — Z113 Encounter for screening for infections with a predominantly sexual mode of transmission: Secondary | ICD-10-CM

## 2024-06-14 DIAGNOSIS — Z79890 Hormone replacement therapy (postmenopausal): Secondary | ICD-10-CM

## 2024-06-14 DIAGNOSIS — B20 Human immunodeficiency virus [HIV] disease: Secondary | ICD-10-CM

## 2024-06-14 DIAGNOSIS — Z789 Other specified health status: Secondary | ICD-10-CM

## 2024-06-14 MED ORDER — ESTRADIOL VALERATE 20 MG/ML IM OIL
15.0000 mg | TOPICAL_OIL | INTRAMUSCULAR | 3 refills | Status: AC
  Filled 2024-06-14: qty 5, 28d supply, fill #0
  Filled 2024-08-16: qty 5, 28d supply, fill #1
  Filled 2024-10-11 – 2024-12-06 (×2): qty 5, 28d supply, fill #2

## 2024-06-14 MED ORDER — "BD HYPODERMIC NEEDLE 18G X 1"" MISC"
4 refills | Status: AC
  Filled 2024-06-14: qty 4, 4d supply, fill #0
  Filled 2024-10-11 – 2024-12-06 (×2): qty 4, 4d supply, fill #1

## 2024-06-14 MED ORDER — CABOTEGRAVIR & RILPIVIRINE ER 600 & 900 MG/3ML IM SUER
1.0000 | Freq: Once | INTRAMUSCULAR | Status: AC
  Administered 2024-06-14: 1 via INTRAMUSCULAR

## 2024-06-14 MED ORDER — SPIRONOLACTONE 100 MG PO TABS
100.0000 mg | ORAL_TABLET | ORAL | 5 refills | Status: AC
  Filled 2024-06-14: qty 30, 30d supply, fill #0
  Filled 2024-10-11: qty 30, 30d supply, fill #1
  Filled 2024-12-06: qty 30, 30d supply, fill #2

## 2024-06-14 MED ORDER — SPIRONOLACTONE 50 MG PO TABS
50.0000 mg | ORAL_TABLET | Freq: Every day | ORAL | 5 refills | Status: AC
  Filled 2024-06-14: qty 30, 30d supply, fill #0
  Filled 2024-08-16: qty 30, 30d supply, fill #1
  Filled 2024-10-11: qty 30, 30d supply, fill #2
  Filled 2024-12-06: qty 30, 30d supply, fill #3

## 2024-06-14 MED ORDER — "BD LUER-LOK SYRINGE 21G X 1-1/2"" 3 ML MISC"
4 refills | Status: AC
  Filled 2024-06-14: qty 4, 4d supply, fill #0
  Filled 2024-08-16: qty 4, 4d supply, fill #1
  Filled 2024-10-11: qty 4, 4d supply, fill #2
  Filled 2024-12-06: qty 4, 4d supply, fill #3

## 2024-06-14 MED ORDER — DOXYCYCLINE HYCLATE 100 MG PO TABS
ORAL_TABLET | ORAL | 1 refills | Status: AC
  Filled 2024-06-14: qty 60, 30d supply, fill #0
  Filled 2024-08-16: qty 60, 30d supply, fill #1
  Filled 2024-10-11 – 2024-12-06 (×2): qty 60, 30d supply, fill #2

## 2024-06-15 ENCOUNTER — Other Ambulatory Visit (HOSPITAL_COMMUNITY): Payer: Self-pay

## 2024-06-15 LAB — RPR: RPR Ser Ql: NONREACTIVE

## 2024-06-16 ENCOUNTER — Ambulatory Visit: Payer: Self-pay | Admitting: Pharmacist

## 2024-06-16 LAB — CYTOLOGY, (ORAL, ANAL, URETHRAL) ANCILLARY ONLY
Chlamydia: NEGATIVE
Chlamydia: NEGATIVE
Comment: NEGATIVE
Comment: NEGATIVE
Comment: NORMAL
Comment: NORMAL
Neisseria Gonorrhea: NEGATIVE
Neisseria Gonorrhea: POSITIVE — AB

## 2024-06-16 LAB — URINE CYTOLOGY ANCILLARY ONLY
Chlamydia: NEGATIVE
Comment: NEGATIVE
Comment: NORMAL
Neisseria Gonorrhea: NEGATIVE

## 2024-06-16 NOTE — Telephone Encounter (Signed)
 Scheduling patient tomorrow 7/10 at 3pm for ceftriaxone  500 mg IM x 1 for pharyngeal gonorrhea. Amanda's schedule is full. Thank you!

## 2024-06-17 ENCOUNTER — Ambulatory Visit (INDEPENDENT_AMBULATORY_CARE_PROVIDER_SITE_OTHER)

## 2024-06-17 ENCOUNTER — Other Ambulatory Visit: Payer: Self-pay

## 2024-06-17 DIAGNOSIS — A549 Gonococcal infection, unspecified: Secondary | ICD-10-CM | POA: Diagnosis present

## 2024-06-17 MED ORDER — CEFTRIAXONE SODIUM 500 MG IJ SOLR
500.0000 mg | Freq: Once | INTRAMUSCULAR | Status: AC
  Administered 2024-06-17: 500 mg via INTRAMUSCULAR

## 2024-06-17 NOTE — Progress Notes (Signed)
 Patient in office today for gonorrhea treatment ceftriaxone  500 mg x 1. Patient tolerated well.  Declined condoms and advised to makes sure her partner is tested and treated.  Travis Palmer, CMA

## 2024-08-05 ENCOUNTER — Other Ambulatory Visit: Payer: Self-pay

## 2024-08-05 ENCOUNTER — Other Ambulatory Visit (HOSPITAL_COMMUNITY): Payer: Self-pay

## 2024-08-05 NOTE — Progress Notes (Signed)
 Specialty Pharmacy Refill Coordination Note  Travis Palmer is a 28 y.o. adult assessed today regarding refills of clinic administered specialty medication(s) Cabotegravir  & Rilpivirine  (Cabenuva )   Clinic requested Courier to Provider Office   Delivery date: 08/12/24   Verified address: [address] [place] [address]   Medication will be filled on 08/11/24.

## 2024-08-11 ENCOUNTER — Other Ambulatory Visit: Payer: Self-pay

## 2024-08-12 ENCOUNTER — Telehealth: Payer: Self-pay

## 2024-08-12 NOTE — Telephone Encounter (Signed)
 [facility] Patient Advocate Encounter  Patient's medications CABENUVA  have been couriered to [facility] from [facility] and will be administered at the patients appointment on 08/16/24.  Charmaine Sharps, CPhT Specialty Pharmacy Patient [facility] for Infectious Disease Phone: [phone] Fax:  [phone]
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
## 2024-08-16 ENCOUNTER — Other Ambulatory Visit: Payer: Self-pay

## 2024-08-16 ENCOUNTER — Other Ambulatory Visit (HOSPITAL_COMMUNITY)
Admission: RE | Admit: 2024-08-16 | Discharge: 2024-08-16 | Disposition: A | Discharge status: Home / Self Care | Source: Ambulatory Visit | Attending: Infectious Disease | Admitting: Infectious Disease

## 2024-08-16 ENCOUNTER — Ambulatory Visit: Admitting: Pharmacist

## 2024-08-16 DIAGNOSIS — B20 Human immunodeficiency virus [HIV] disease: Secondary | ICD-10-CM | POA: Diagnosis not present

## 2024-08-16 DIAGNOSIS — Z0000 Encounter for general adult medical examination without abnormal findings: Secondary | ICD-10-CM | POA: Diagnosis not present

## 2024-08-16 DIAGNOSIS — Z113 Encounter for screening for infections with a predominantly sexual mode of transmission: Secondary | ICD-10-CM

## 2024-08-16 MED ORDER — CABOTEGRAVIR & RILPIVIRINE ER 600 & 900 MG/3ML IM SUER
1.0000 | Freq: Once | INTRAMUSCULAR | Status: AC
  Administered 2024-08-16: 1 via INTRAMUSCULAR

## 2024-08-17 LAB — CYTOLOGY, (ORAL, ANAL, URETHRAL) ANCILLARY ONLY
Chlamydia: NEGATIVE
Chlamydia: NEGATIVE
Comment: NEGATIVE
Comment: NEGATIVE
Comment: NORMAL
Comment: NORMAL
Neisseria Gonorrhea: NEGATIVE
Neisseria Gonorrhea: NEGATIVE

## 2024-08-17 LAB — URINE CYTOLOGY ANCILLARY ONLY
Chlamydia: NEGATIVE
Comment: NEGATIVE
Comment: NORMAL
Neisseria Gonorrhea: NEGATIVE

## 2024-08-18 ENCOUNTER — Other Ambulatory Visit: Payer: Self-pay

## 2024-08-18 LAB — LIPID PANEL
Cholesterol: 148 mg/dL (ref <200)
HDL: 58 mg/dL (ref >=40)
LDL Cholesterol (Calc): 61 mg/dL
Non-HDL Cholesterol (Calc): 90 mg/dL (ref <130)
Total CHOL/HDL Ratio: 2.6 (calc) (ref <5.0)
Triglycerides: 230 mg/dL — ABNORMAL HIGH (ref <150)

## 2024-08-18 LAB — RPR: RPR Ser Ql: NONREACTIVE

## 2024-08-18 LAB — HIV-1 RNA QUANT-NO REFLEX-BLD
HIV 1 RNA Quant: NOT DETECTED {copies}/mL
HIV-1 RNA Quant, Log: NOT DETECTED {Log_copies}/mL

## 2024-08-31 ENCOUNTER — Encounter: Payer: Self-pay | Admitting: Pharmacist

## 2024-08-31 ENCOUNTER — Other Ambulatory Visit: Payer: Self-pay | Admitting: Infectious Disease

## 2024-08-31 DIAGNOSIS — Z79899 Other long term (current) drug therapy: Secondary | ICD-10-CM

## 2024-08-31 NOTE — Telephone Encounter (Signed)
 You said they weren't taking new patients upstairs anymore, right?

## 2024-08-31 NOTE — Telephone Encounter (Signed)
 Do you mind placing a referral to [facility] for this?

## 2024-08-31 NOTE — Telephone Encounter (Signed)
 [facility] no longer offers HRT for transgender pts.

## 2024-08-31 NOTE — Telephone Encounter (Signed)
 Thanks Cece. Do you know where we can refer patients to now?

## 2024-09-15 ENCOUNTER — Other Ambulatory Visit: Payer: Self-pay | Admitting: Infectious Disease

## 2024-09-15 DIAGNOSIS — Z789 Other specified health status: Secondary | ICD-10-CM

## 2024-09-15 NOTE — Telephone Encounter (Signed)
 Hey Dr. Fleeta Rothman - can you resend the referral to [facility] please?

## 2024-09-29 ENCOUNTER — Other Ambulatory Visit (HOSPITAL_COMMUNITY): Payer: Self-pay

## 2024-09-29 ENCOUNTER — Other Ambulatory Visit: Payer: Self-pay

## 2024-09-29 NOTE — Progress Notes (Signed)
 Specialty Pharmacy Refill Coordination Note  Travis Palmer is a 28 y.o. adult assessed today regarding refills of clinic administered specialty medication(s) Cabotegravir  & Rilpivirine  (Cabenuva )   Clinic requested Courier to Provider Office   Delivery date: 10/07/24   Verified address: [address] [place] [address]   Medication will be filled on 10/06/24.

## 2024-10-06 ENCOUNTER — Other Ambulatory Visit: Payer: Self-pay

## 2024-10-07 ENCOUNTER — Telehealth: Payer: Self-pay

## 2024-10-07 NOTE — Telephone Encounter (Signed)
 [facility] Patient Advocate Encounter  Patient's medications CABENUVA have been couriered to [facility] from [facility] and will be administered at the patients appointment on 10/11/24.  Charmaine Sharps, CPhT Specialty Pharmacy Patient [facility] for Infectious Disease Phone: [phone] Fax:  [phone]
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
## 2024-10-11 ENCOUNTER — Other Ambulatory Visit: Payer: Self-pay

## 2024-10-11 ENCOUNTER — Encounter: Payer: Self-pay | Admitting: Infectious Disease

## 2024-10-11 ENCOUNTER — Ambulatory Visit (INDEPENDENT_AMBULATORY_CARE_PROVIDER_SITE_OTHER): Admitting: Infectious Disease

## 2024-10-11 ENCOUNTER — Other Ambulatory Visit (HOSPITAL_COMMUNITY)
Admission: RE | Admit: 2024-10-11 | Discharge: 2024-10-11 | Disposition: A | Discharge status: Home / Self Care | Source: Ambulatory Visit | Attending: Infectious Disease | Admitting: Infectious Disease

## 2024-10-11 VITALS — BP 125/76 | HR 74 | Temp 97.9°F | Ht 68.0 in | Wt 190.0 lb

## 2024-10-11 DIAGNOSIS — A5149 Other secondary syphilitic conditions: Secondary | ICD-10-CM

## 2024-10-11 DIAGNOSIS — Z113 Encounter for screening for infections with a predominantly sexual mode of transmission: Secondary | ICD-10-CM | POA: Diagnosis not present

## 2024-10-11 DIAGNOSIS — B20 Human immunodeficiency virus [HIV] disease: Secondary | ICD-10-CM | POA: Diagnosis not present

## 2024-10-11 DIAGNOSIS — Z79899 Other long term (current) drug therapy: Secondary | ICD-10-CM

## 2024-10-11 DIAGNOSIS — R21 Rash and other nonspecific skin eruption: Secondary | ICD-10-CM | POA: Diagnosis not present

## 2024-10-11 DIAGNOSIS — Z789 Other specified health status: Secondary | ICD-10-CM

## 2024-10-11 MED ORDER — CABOTEGRAVIR & RILPIVIRINE ER 600 & 900 MG/3ML IM SUER
1.0000 | Freq: Once | INTRAMUSCULAR | Status: AC
  Administered 2024-10-11: 1 via INTRAMUSCULAR

## 2024-10-11 MED ORDER — DOXYCYCLINE HYCLATE 100 MG PO TABS
100.0000 mg | ORAL_TABLET | Freq: Two times a day (BID) | ORAL | 1 refills | Status: AC
  Filled 2024-10-11: qty 28, 14d supply, fill #0

## 2024-10-12 ENCOUNTER — Ambulatory Visit: Payer: Self-pay

## 2024-10-12 LAB — CYTOLOGY, (ORAL, ANAL, URETHRAL) ANCILLARY ONLY
Chlamydia: NEGATIVE
Chlamydia: NEGATIVE
Comment: NEGATIVE
Comment: NEGATIVE
Comment: NORMAL
Comment: NORMAL
Neisseria Gonorrhea: NEGATIVE
Neisseria Gonorrhea: POSITIVE — AB

## 2024-10-12 LAB — URINE CYTOLOGY ANCILLARY ONLY
Chlamydia: NEGATIVE
Comment: NEGATIVE
Comment: NORMAL
Neisseria Gonorrhea: NEGATIVE

## 2024-10-12 LAB — T-HELPER CELLS (CD4) COUNT (NOT AT ARMC)
CD4 % Helper T Cell: 41 % (ref 33–65)
CD4 T Cell Abs: 961 /uL (ref 400–1790)

## 2024-10-13 ENCOUNTER — Ambulatory Visit

## 2024-10-13 DIAGNOSIS — Z113 Encounter for screening for infections with a predominantly sexual mode of transmission: Secondary | ICD-10-CM | POA: Diagnosis not present

## 2024-10-13 DIAGNOSIS — Z708 Other sex counseling: Secondary | ICD-10-CM | POA: Diagnosis not present

## 2024-10-13 DIAGNOSIS — A546 Gonococcal infection of anus and rectum: Secondary | ICD-10-CM | POA: Diagnosis not present

## 2024-10-13 LAB — COMPLETE METABOLIC PANEL WITHOUT GFR
AG Ratio: 1.8 (calc) (ref 1.0–2.5)
ALT: 16 U/L (ref 9–46)
AST: 18 U/L (ref 10–40)
Albumin: 4.6 g/dL (ref 3.6–5.1)
Alkaline phosphatase (APISO): 43 U/L (ref 36–130)
BUN: 10 mg/dL (ref 7–25)
CO2: 26 mmol/L (ref 20–32)
Calcium: 9.1 mg/dL (ref 8.6–10.3)
Chloride: 106 mmol/L (ref 98–110)
Creat: 0.9 mg/dL (ref 0.60–1.26)
Globulin: 2.5 g/dL (ref 1.9–3.7)
Glucose, Bld: 95 mg/dL (ref 65–99)
Potassium: 3.6 mmol/L (ref 3.5–5.3)
Sodium: 140 mmol/L (ref 135–146)
Total Bilirubin: 0.8 mg/dL (ref 0.2–1.2)
Total Protein: 7.1 g/dL (ref 6.1–8.1)

## 2024-10-13 LAB — ESTRADIOL: Estradiol: <30 pg/mL (ref <=39)

## 2024-10-13 LAB — CBC WITH DIFFERENTIAL/PLATELET
Absolute Lymphocytes: 2298 {cells}/uL (ref 850–3900)
Absolute Monocytes: 451 {cells}/uL (ref 200–950)
Basophils Absolute: 49 {cells}/uL (ref 0–200)
Basophils Relative: 1 %
Eosinophils Absolute: 69 {cells}/uL (ref 15–500)
Eosinophils Relative: 1.4 %
HCT: 41.6 % (ref 38.5–50.0)
Hemoglobin: 13.7 g/dL (ref 13.2–17.1)
MCH: 32.5 pg (ref 27.0–33.0)
MCHC: 32.9 g/dL (ref 32.0–36.0)
MCV: 98.8 fL (ref 80.0–100.0)
MPV: 9.4 fL (ref 7.5–12.5)
Monocytes Relative: 9.2 %
Neutro Abs: 2034 {cells}/uL (ref 1500–7800)
Neutrophils Relative %: 41.5 %
Platelets: 353 Thousand/uL (ref 140–400)
RBC: 4.21 Million/uL (ref 4.20–5.80)
RDW: 12.1 % (ref 11.0–15.0)
Total Lymphocyte: 46.9 %
WBC: 4.9 Thousand/uL (ref 3.8–10.8)

## 2024-10-13 LAB — TESTOSTERONE: Testosterone: 647 ng/dL (ref 250–827)

## 2024-10-13 LAB — RPR: RPR Ser Ql: NONREACTIVE

## 2024-10-13 LAB — HIV-1 RNA QUANT-NO REFLEX-BLD
HIV 1 RNA Quant: NOT DETECTED {copies}/mL
HIV-1 RNA Quant, Log: NOT DETECTED {Log_copies}/mL

## 2024-10-14 ENCOUNTER — Ambulatory Visit

## 2024-10-15 ENCOUNTER — Telehealth: Payer: Self-pay

## 2024-10-15 NOTE — Telephone Encounter (Signed)
 Called patient regarding missed appt for STD testing. Pt stated she was treated at health department and was tested for syphillis as well. C/o rash on hand. Testing negative on 11/3. Called [facility] and spoke with Cheroye who does not see treatment for Gonorrhea or testing by them.  Will send mychart message to patient to confirm which HD she went to. Lorenda CHRISTELLA Code, RMA

## 2024-10-15 NOTE — Telephone Encounter (Signed)
 Left vm with [facility] requesting call back to confirm treatment.  Lorenda CHRISTELLA Code, RMA

## 2024-10-19 ENCOUNTER — Other Ambulatory Visit: Payer: Self-pay

## 2024-10-20 ENCOUNTER — Other Ambulatory Visit: Payer: Self-pay

## 2024-11-11 ENCOUNTER — Other Ambulatory Visit: Payer: Self-pay

## 2024-11-11 ENCOUNTER — Other Ambulatory Visit (HOSPITAL_COMMUNITY): Payer: Self-pay

## 2024-11-11 NOTE — Progress Notes (Signed)
 Patient's refill has been scheduled in [place].

## 2024-11-11 NOTE — Progress Notes (Signed)
 Specialty Pharmacy Refill Coordination Note  Travis Palmer is a 28 y.o. adult assessed today regarding refills of clinic administered specialty medication(s) Cabotegravir  & Rilpivirine  (Cabenuva )   Clinic requested Courier to Provider Office   Delivery date: 11/25/24   Verified address: [address] [place] [address]   Medication will be filled on 11/24/24.

## 2024-11-18 ENCOUNTER — Ambulatory Visit: Admitting: Internal Medicine

## 2024-11-18 DIAGNOSIS — Z113 Encounter for screening for infections with a predominantly sexual mode of transmission: Secondary | ICD-10-CM | POA: Diagnosis not present

## 2024-11-24 ENCOUNTER — Other Ambulatory Visit: Payer: Self-pay

## 2024-11-25 ENCOUNTER — Telehealth: Payer: Self-pay

## 2024-11-25 NOTE — Telephone Encounter (Signed)
 [facility] Patient Advocate Encounter  Patient's medications CABENUVA  have been couriered to [facility] from [facility] and will be administered at the patients appointment on 12/06/24.  Charmaine Sharps, CPhT Specialty Pharmacy Patient [facility] Phone: [phone] Fax:  [phone]
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
## 2024-12-06 ENCOUNTER — Other Ambulatory Visit: Payer: Self-pay

## 2024-12-06 ENCOUNTER — Other Ambulatory Visit (HOSPITAL_COMMUNITY)
Admission: RE | Admit: 2024-12-06 | Discharge: 2024-12-06 | Disposition: A | Discharge status: Home / Self Care | Source: Ambulatory Visit | Attending: Infectious Disease | Admitting: Infectious Disease

## 2024-12-06 ENCOUNTER — Ambulatory Visit (INDEPENDENT_AMBULATORY_CARE_PROVIDER_SITE_OTHER): Payer: Self-pay | Admitting: Pharmacist

## 2024-12-06 DIAGNOSIS — B20 Human immunodeficiency virus [HIV] disease: Secondary | ICD-10-CM | POA: Diagnosis present

## 2024-12-06 DIAGNOSIS — Z113 Encounter for screening for infections with a predominantly sexual mode of transmission: Secondary | ICD-10-CM

## 2024-12-06 DIAGNOSIS — Z789 Other specified health status: Secondary | ICD-10-CM

## 2024-12-06 DIAGNOSIS — Z79890 Hormone replacement therapy (postmenopausal): Secondary | ICD-10-CM

## 2024-12-06 MED ORDER — CABOTEGRAVIR & RILPIVIRINE ER 600 & 900 MG/3ML IM SUER
1.0000 | Freq: Once | INTRAMUSCULAR | Status: AC
  Administered 2024-12-06: 1 via INTRAMUSCULAR

## 2024-12-07 LAB — SYPHILIS: RPR W/REFLEX TO RPR TITER AND TREPONEMAL ANTIBODIES, TRADITIONAL SCREENING AND DIAGNOSIS ALGORITHM: RPR Ser Ql: NONREACTIVE

## 2024-12-08 LAB — CYTOLOGY, (ORAL, ANAL, URETHRAL) ANCILLARY ONLY
Chlamydia: NEGATIVE
Chlamydia: NEGATIVE
Comment: NEGATIVE
Comment: NEGATIVE
Comment: NORMAL
Comment: NORMAL
Neisseria Gonorrhea: NEGATIVE
Neisseria Gonorrhea: NEGATIVE

## 2024-12-08 LAB — URINE CYTOLOGY ANCILLARY ONLY
Chlamydia: NEGATIVE
Comment: NEGATIVE
Comment: NORMAL
Neisseria Gonorrhea: NEGATIVE

## 2024-12-22 ENCOUNTER — Other Ambulatory Visit: Payer: Self-pay

## 2025-02-07 ENCOUNTER — Ambulatory Visit: Payer: Self-pay | Admitting: Infectious Disease

## 2025-05-12 ENCOUNTER — Ambulatory Visit: Admitting: Diagnostic Neuroimaging
